# Patient Record
Sex: Male | Born: 1950 | Race: Black or African American | Hispanic: No | Smoking: Current some day smoker
Health system: Southern US, Community
[De-identification: ages and names within clinical notes are randomized; demographics above are authoritative.]

## PROBLEM LIST (undated history)

## (undated) DIAGNOSIS — G629 Polyneuropathy, unspecified: Secondary | ICD-10-CM

## (undated) DIAGNOSIS — F1290 Cannabis use, unspecified, uncomplicated: Secondary | ICD-10-CM

## (undated) DIAGNOSIS — F1910 Other psychoactive substance abuse, uncomplicated: Secondary | ICD-10-CM

## (undated) DIAGNOSIS — M797 Fibromyalgia: Secondary | ICD-10-CM

## (undated) DIAGNOSIS — R001 Bradycardia, unspecified: Secondary | ICD-10-CM

## (undated) DIAGNOSIS — Z720 Tobacco use: Secondary | ICD-10-CM

## (undated) DIAGNOSIS — G4733 Obstructive sleep apnea (adult) (pediatric): Secondary | ICD-10-CM

## (undated) DIAGNOSIS — M5137 Other intervertebral disc degeneration, lumbosacral region: Secondary | ICD-10-CM

## (undated) DIAGNOSIS — J45909 Unspecified asthma, uncomplicated: Secondary | ICD-10-CM

## (undated) DIAGNOSIS — Z8739 Personal history of other diseases of the musculoskeletal system and connective tissue: Secondary | ICD-10-CM

## (undated) DIAGNOSIS — M1990 Unspecified osteoarthritis, unspecified site: Secondary | ICD-10-CM

## (undated) DIAGNOSIS — E785 Hyperlipidemia, unspecified: Secondary | ICD-10-CM

## (undated) DIAGNOSIS — R0600 Dyspnea, unspecified: Secondary | ICD-10-CM

## (undated) DIAGNOSIS — I10 Essential (primary) hypertension: Secondary | ICD-10-CM

## (undated) DIAGNOSIS — E559 Vitamin D deficiency, unspecified: Secondary | ICD-10-CM

## (undated) DIAGNOSIS — M51379 Other intervertebral disc degeneration, lumbosacral region without mention of lumbar back pain or lower extremity pain: Secondary | ICD-10-CM

## (undated) DIAGNOSIS — M549 Dorsalgia, unspecified: Secondary | ICD-10-CM

## (undated) HISTORY — PX: ABDOMINAL EXPLORATION SURGERY: SHX538

## (undated) HISTORY — DX: Obstructive sleep apnea (adult) (pediatric): G47.33

## (undated) HISTORY — DX: Essential (primary) hypertension: I10

## (undated) HISTORY — DX: Hyperlipidemia, unspecified: E78.5

## (undated) HISTORY — PX: TOTAL KNEE ARTHROPLASTY: SHX125

## (undated) HISTORY — PX: BACK SURGERY: SHX140

## (undated) HISTORY — PX: LEG SURGERY: SHX1003

## (undated) HISTORY — PX: JOINT REPLACEMENT: SHX530

---

## 2016-03-09 ENCOUNTER — Emergency Department
Admission: EM | Admit: 2016-03-09 | Discharge: 2016-03-09 | Disposition: A | Discharge status: Home / Self Care | Payer: Medicare HMO | Attending: Emergency Medicine | Admitting: Emergency Medicine

## 2016-03-09 ENCOUNTER — Encounter: Payer: Self-pay | Admitting: Emergency Medicine

## 2016-03-09 ENCOUNTER — Emergency Department: Payer: Medicare HMO

## 2016-03-09 DIAGNOSIS — M5441 Lumbago with sciatica, right side: Secondary | ICD-10-CM | POA: Insufficient documentation

## 2016-03-09 DIAGNOSIS — F17200 Nicotine dependence, unspecified, uncomplicated: Secondary | ICD-10-CM | POA: Insufficient documentation

## 2016-03-09 DIAGNOSIS — M545 Low back pain: Secondary | ICD-10-CM | POA: Diagnosis present

## 2016-03-09 DIAGNOSIS — G8929 Other chronic pain: Secondary | ICD-10-CM | POA: Insufficient documentation

## 2016-03-09 HISTORY — DX: Dorsalgia, unspecified: M54.9

## 2016-03-09 MED ORDER — HYDROCODONE-ACETAMINOPHEN 5-325 MG PO TABS: 1.0000 | ORAL_TABLET | ORAL | 0 refills | Status: DC | PRN

## 2016-03-09 MED ORDER — PREDNISONE 10 MG PO TABS: ORAL_TABLET | ORAL | 0 refills | Status: DC

## 2016-03-09 NOTE — ED Provider Notes (Signed)
[facility] Emergency Department Provider Note  ____________________________________________   First MD Initiated Contact with Patient 03/09/16 1312     (approximate)  I have reviewed the triage vital signs and the nursing notes.   HISTORY  Chief Complaint Leg Pain   HPI Jack Blanchard is a 65 y.o. male is her complaint of pain radiating into his right leg over to his hip and down to his toes. Patient states he had back surgery approximately one year ago and has noticed gradual symptoms for the last 7 months. Patient states that at times it is difficult for him to get up and down and that low back pain has continued. He denies any incontinence of bowel or bladder. He states stiffness and difficulty with movement is worse first thing in the mornings. Patient is not on any medication as he does not have a primary care doctor and has not seen his orthopedic surgeon as this doctor has either moved or retired. Currently he rates his pain as 6/10.   Past Medical History:  Diagnosis Date  . Back pain     There are no active problems to display for this patient.   Past Surgical History:  Procedure Laterality Date  . LEG SURGERY      Prior to Admission medications   Medication Sig Start Date End Date Taking? Authorizing Provider  HYDROcodone-acetaminophen (NORCO/VICODIN) 5-325 MG tablet Take 1 tablet by mouth every 4 (four) hours as needed for moderate pain. 03/09/16   Johnn Hai, PA-C  predniSONE (DELTASONE) 10 MG tablet Take 6 tablets  today, on day 2 take 5 tablets, day 3 take 4 tablets, day 4 take 3 tablets, day 5 take  2 tablets and 1 tablet the last day 03/09/16   Johnn Hai, PA-C    Allergies Review of patient's allergies indicates no known allergies.  No family history on file.  Social History Social History  Substance Use Topics  . Smoking status: Current Some Day Smoker  . Smokeless tobacco: Never Used  . Alcohol use Yes     Review of Systems Constitutional: No fever/chills Cardiovascular: Denies chest pain. Respiratory: Denies shortness of breath. Gastrointestinal: No abdominal pain.  No nausea, no vomiting.  No diarrhea.  No constipation. Genitourinary: Negative for dysuria. Musculoskeletal: Positive for chronic back pain. Positive for paresthesias right leg. Skin: Negative for rash. Neurological: Negative for headaches, focal weakness or numbness.  10-point ROS otherwise negative.  ____________________________________________   PHYSICAL EXAM:  VITAL SIGNS: ED Triage Vitals  Enc Vitals Group     BP 03/09/16 1239 (!) 143/77     Pulse Rate 03/09/16 1239 71     Resp 03/09/16 1239 18     Temp 03/09/16 1239 98 F (36.7 C)     Temp Source 03/09/16 1239 Oral     SpO2 03/09/16 1239 100 %     Weight 03/09/16 1239 240 lb (108.9 kg)     Height 03/09/16 1239 5\' 11"  (1.803 m)     Head Circumference --      Peak Flow --      Pain Score 03/09/16 1240 6     Pain Loc --      Pain Edu? --      Excl. in [place]? --     Constitutional: Alert and oriented. Well appearing and in no acute distress. Eyes: Conjunctivae are normal. PERRL. EOMI. Head: Atraumatic. Nose: No congestion/rhinnorhea. Mouth/Throat: Mucous membranes are moist.  Oropharynx non-erythematous. Neck: No stridor.  Cardiovascular: Normal rate, regular rhythm. Grossly normal heart sounds.  Good peripheral circulation. Respiratory: Normal respiratory effort.  No retractions. Lungs CTAB. Gastrointestinal: Soft and nontender. No distention.  No CVA tenderness. Musculoskeletal: Examination of the back there is no gross deformity but a well-healed surgical scar present. There is tenderness on palpation of L5-S1 area and paravertebral muscles mostly to the right. SI joint is moderately tender. Straight leg raises on the right increases patient's pain at approximately 70. Neurologic:  Normal speech and language. No gross focal neurologic deficits are  appreciated. No gait instability. Reflexes are 2+ bilaterally. Skin:  Skin is warm, dry and intact. No rash noted. Psychiatric: Mood and affect are normal. Speech and behavior are normal.  ____________________________________________   LABS (all labs ordered are listed, but only abnormal results are displayed)  Labs Reviewed - No data to display  RADIOLOGY Lumbar spine x-ray per radiologist: IMPRESSION:  1. No acute finding.  2. Diffuse lumbar disc and facet degeneration as described.  3. L5-S1 solid bony fusion.       ____________________________________________   PROCEDURES  Procedure(s) performed: None  Procedures  Critical Care performed: No  ____________________________________________   INITIAL IMPRESSION / ASSESSMENT AND PLAN / ED COURSE  Pertinent labs & imaging results that were available during my care of the patient were reviewed by me and considered in my medical decision making (see chart for details).    Clinical Course  Patient was encouraged to obtain a primary care doctor for further evaluation and care management. He was also given the name of Dr.Poggi who is on call for orthopedics today should he continued to have problems with his back. Patient was given a prescription for prednisone and is 60 mg with a 6 day taper and Norco as needed for pain. He'll return to the emergency room if any severe worsening of his symptoms or incontinence of bowel or bladder.   ____________________________________________   FINAL CLINICAL IMPRESSION(S) / ED DIAGNOSES  Final diagnoses:  Acute right-sided low back pain with right-sided sciatica      NEW MEDICATIONS STARTED DURING THIS VISIT:  Discharge Medication List as of 03/09/2016  2:45 PM    START taking these medications   Details  HYDROcodone-acetaminophen (NORCO/VICODIN) 5-325 MG tablet Take 1 tablet by mouth every 4 (four) hours as needed for moderate pain., Starting Mon 03/09/2016, Print      predniSONE (DELTASONE) 10 MG tablet Take 6 tablets  today, on day 2 take 5 tablets, day 3 take 4 tablets, day 4 take 3 tablets, day 5 take  2 tablets and 1 tablet the last day, Print         Note:  This document was prepared using Dragon voice recognition software and may include unintentional dictation errors.    Johnn Hai, PA-C 03/09/16 1800    Drenda Freeze, MD 03/10/16 [phone]

## 2016-03-09 NOTE — ED Triage Notes (Signed)
Pt presents to ED with reports of right leg pain that starts at his hip and travels to his toes. Pt reports he had surgery on this leg over a year ago and has had pain ever since. Pt reports no new problem but states he is tired of hurting. Pt also reports low back pain.

## 2016-03-09 NOTE — Discharge Instructions (Signed)
Begin taking prednisone as directed. Norco as needed for pain.  Be aware that you  should not be driving or operating any machinery while taking this medication. Follow-up with [facility] acute care or Dr. Roland Rack if any continued problems with your  back.

## 2016-03-13 ENCOUNTER — Ambulatory Visit: Payer: Self-pay | Admitting: Family Medicine

## 2016-04-24 DIAGNOSIS — G8929 Other chronic pain: Secondary | ICD-10-CM | POA: Insufficient documentation

## 2016-04-24 DIAGNOSIS — M5441 Lumbago with sciatica, right side: Secondary | ICD-10-CM

## 2017-04-30 ENCOUNTER — Encounter: Payer: Self-pay | Admitting: Emergency Medicine

## 2017-04-30 ENCOUNTER — Emergency Department
Admission: EM | Admit: 2017-04-30 | Discharge: 2017-04-30 | Disposition: A | Discharge status: Home / Self Care | Payer: Medicare HMO | Attending: Student in an Organized Health Care Education/Training Program | Admitting: Student in an Organized Health Care Education/Training Program

## 2017-04-30 ENCOUNTER — Other Ambulatory Visit: Payer: Self-pay

## 2017-04-30 DIAGNOSIS — M5410 Radiculopathy, site unspecified: Secondary | ICD-10-CM | POA: Diagnosis not present

## 2017-04-30 DIAGNOSIS — M549 Dorsalgia, unspecified: Secondary | ICD-10-CM | POA: Diagnosis present

## 2017-04-30 DIAGNOSIS — Z79899 Other long term (current) drug therapy: Secondary | ICD-10-CM | POA: Insufficient documentation

## 2017-04-30 DIAGNOSIS — F17200 Nicotine dependence, unspecified, uncomplicated: Secondary | ICD-10-CM | POA: Diagnosis not present

## 2017-04-30 HISTORY — DX: Personal history of other diseases of the musculoskeletal system and connective tissue: Z87.39

## 2017-04-30 MED ORDER — CYCLOBENZAPRINE HCL 10 MG PO TABS: 10.0000 mg | ORAL_TABLET | Freq: Three times a day (TID) | ORAL | 0 refills | Status: DC | PRN

## 2017-04-30 MED ORDER — METHYLPREDNISOLONE 4 MG PO TBPK: ORAL_TABLET | ORAL | 0 refills | Status: DC

## 2017-04-30 MED ORDER — KETOROLAC TROMETHAMINE 60 MG/2ML IM SOLN
60.0000 mg | Freq: Once | INTRAMUSCULAR | Status: AC
  Administered 2017-04-30: 60 mg via INTRAMUSCULAR
  Filled 2017-04-30: qty 2

## 2017-04-30 MED ORDER — TRAMADOL HCL 50 MG PO TABS: 50.0000 mg | ORAL_TABLET | Freq: Four times a day (QID) | ORAL | 0 refills | Status: AC | PRN

## 2017-04-30 NOTE — ED Notes (Signed)
Pt states he is having back pain. Pt has hx of back sx with screws and rods. Pt is stating he believes it is his sciatica nerve because pain goes both legs. Pt is NAD, ambulatory with cane and awaiting EDP.

## 2017-04-30 NOTE — ED Provider Notes (Signed)
[facility] Emergency Department Provider Note   ____________________________________________   First MD Initiated Contact with Patient 04/30/17 1041     (approximate)  I have reviewed the triage vital signs and the nursing notes.   HISTORY  Chief Complaint Back Pain    HPI Jack Blanchard is a 66 y.o. male patient complaining of radicular back pain bilateral lower extremities. Patient state onset 2-3 days. Patient denies bladder or bowel dysfunction.Patient rates the pain as 8/10. Patient described a pain as "achy". No palliative measures for complaint. 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 Past Medical History:  Diagnosis Date  . Back pain   . History of degenerative disc disease     There are no active problems to display for this patient.   Past Surgical History:  Procedure Laterality Date  . BACK SURGERY     lumbar surgery with rods placed  . LEG SURGERY      Prior to Admission medications   Medication Sig Start Date End Date Taking? Authorizing Provider  cyclobenzaprine (FLEXERIL) 10 MG tablet Take 1 tablet (10 mg total) by mouth 3 (three) times daily as needed. 04/30/17   Sable Feil, PA-C  HYDROcodone-acetaminophen (NORCO/VICODIN) 5-325 MG tablet Take 1 tablet by mouth every 4 (four) hours as needed for moderate pain. 03/09/16   Johnn Hai, PA-C  methylPREDNISolone (MEDROL DOSEPAK) 4 MG TBPK tablet Take Tapered dose as directed 04/30/17   Sable Feil, PA-C  predniSONE (DELTASONE) 10 MG tablet Take 6 tablets  today, on day 2 take 5 tablets, day 3 take 4 tablets, day 4 take 3 tablets, day 5 take  2 tablets and 1 tablet the last day 03/09/16   Johnn Hai, PA-C  traMADol (ULTRAM) 50 MG tablet Take 1 tablet (50 mg total) by mouth every 6 (six) hours as needed. 04/30/17 04/30/18  Sable Feil, PA-C    Allergies Patient has no known allergies.  No family history on  file.  Social History Social History   Tobacco Use  . Smoking status: Current Some Day Smoker  . Smokeless tobacco: Never Used  Substance Use Topics  . Alcohol use: Yes  . Drug use: Not on file    Review of Systems Constitutional: No fever/chills Eyes: No visual changes. ENT: No sore throat. Cardiovascular: Denies chest pain. Respiratory: Denies shortness of breath. Gastrointestinal: No abdominal pain.  No nausea, no vomiting.  No diarrhea.  No constipation. Genitourinary: Negative for dysuria. Musculoskeletal: Positive for back pain. Skin: Negative for rash. Neurological: Negative for headaches, focal weakness or numbness.   ____________________________________________   PHYSICAL EXAM:  VITAL SIGNS: ED Triage Vitals  Enc Vitals Group     BP 04/30/17 0938 (!) 159/80     Pulse Rate 04/30/17 0938 70     Resp 04/30/17 0938 16     Temp 04/30/17 0938 97.9 F (36.6 C)     Temp Source 04/30/17 0938 Oral     SpO2 04/30/17 0938 96 %     Weight 04/30/17 0937 250 lb (113.4 kg)     Height 04/30/17 0937 5\' 11"  (1.803 m)     Head Circumference --      Peak Flow --      Pain Score 04/30/17 0937 8     Pain Loc --      Pain Edu? --      Excl. in [place]? --     Constitutional: Alert and oriented. Well  appearing and in no acute distress. Hematological/Lymphatic/Immunilogical: No cervical lymphadenopathy. Cardiovascular: Normal rate, regular rhythm. Grossly normal heart sounds.  Good peripheral circulation. Respiratory: Normal respiratory effort.  No retractions. Lungs CTAB. Gastrointestinal: Soft and nontender. No distention. No abdominal bruits. No CVA tenderness. Musculoskeletal: No obvious deformity of the surgical scar consistent with history. Patient decreased range of motion with flexion and extension. Patient has negative straight leg test on the left but positive on the right. Neurologic:  Normal speech and language. No gross focal neurologic deficits are appreciated. No gait  instability. Skin:  Skin is warm, dry and intact. No rash noted. Psychiatric: Mood and affect are normal. Speech and behavior are normal.  ____________________________________________   LABS (all labs ordered are listed, but only abnormal results are displayed)  Labs Reviewed - No data to display ____________________________________________  EKG   ____________________________________________  RADIOLOGY  No results found.  ____________________________________________   PROCEDURES  Procedure(s) performed: None  Procedures  Critical Care performed: No  ____________________________________________   INITIAL IMPRESSION / ASSESSMENT AND PLAN / ED COURSE  As part of my medical decision making, I reviewed the following data within the electronic MEDICAL RECORD NUMBER    Radicular back pain. Patient given discharge care instructions. Patient advised to follow her treating doctor for continued care.      ____________________________________________   FINAL CLINICAL IMPRESSION(S) / ED DIAGNOSES  Final diagnoses:  Radicular low back pain     ED Discharge Orders        Ordered    methylPREDNISolone (MEDROL DOSEPAK) 4 MG TBPK tablet     04/30/17 1051    traMADol (ULTRAM) 50 MG tablet  Every 6 hours PRN     04/30/17 1051    cyclobenzaprine (FLEXERIL) 10 MG tablet  3 times daily PRN     04/30/17 1051       Note:  This document was prepared using Dragon voice recognition software and may include unintentional dictation errors.    Sable Feil, PA-C 04/30/17 1056    Merlyn Lot, MD 04/30/17 [phone]

## 2017-04-30 NOTE — ED Triage Notes (Signed)
Arrives with c/o low back pain radiating down legs.  Left > Right.  Onset of symptoms 2-3 days ago.

## 2017-04-30 NOTE — ED Notes (Signed)
Pt verbalized understanding of discharge instructions. NAD at this time. 

## 2017-07-21 ENCOUNTER — Telehealth: Payer: Self-pay | Admitting: Gastroenterology

## 2017-07-21 NOTE — Telephone Encounter (Signed)
PT CALLED TO SCHEDULE HIS REFERRAL APT FOR A COLONBOSCOPY PLEASE RETURN CALL

## 2017-07-22 ENCOUNTER — Telehealth: Payer: Self-pay | Admitting: Gastroenterology

## 2017-07-22 ENCOUNTER — Other Ambulatory Visit: Payer: Self-pay

## 2017-07-22 DIAGNOSIS — Z1211 Encounter for screening for malignant neoplasm of colon: Secondary | ICD-10-CM

## 2017-07-22 NOTE — Telephone Encounter (Signed)
Pt is calling to set up colonoscopy

## 2017-07-22 NOTE — Telephone Encounter (Signed)
Gastroenterology Pre-Procedure Review  Request Date: 08/03/17 Requesting Physician: Dr. Marius Ditch  PATIENT REVIEW QUESTIONS: The patient responded to the following health history questions as indicated:    1. Are you having any GI issues? no 2. Do you have a personal history of Polyps? no 3. Do you have a family history of Colon Cancer or Polyps? no 4. Diabetes Mellitus? no 5. Joint replacements in the past 12 months?no 6. Major health problems in the past 3 months?no 7. Any artificial heart valves, MVP, or defibrillator?no    MEDICATIONS & ALLERGIES:    Patient reports the following regarding taking any anticoagulation/antiplatelet therapy:   Plavix, Coumadin, Eliquis, Xarelto, Lovenox, Pradaxa, Brilinta, or Effient? no Aspirin? no  Patient confirms/reports the following medications:  Current Outpatient Medications  Medication Sig Dispense Refill  . cyclobenzaprine (FLEXERIL) 10 MG tablet Take 1 tablet (10 mg total) by mouth 3 (three) times daily as needed. 15 tablet 0  . HYDROcodone-acetaminophen (NORCO/VICODIN) 5-325 MG tablet Take 1 tablet by mouth every 4 (four) hours as needed for moderate pain. 15 tablet 0  . methylPREDNISolone (MEDROL DOSEPAK) 4 MG TBPK tablet Take Tapered dose as directed 21 tablet 0  . predniSONE (DELTASONE) 10 MG tablet Take 6 tablets  today, on day 2 take 5 tablets, day 3 take 4 tablets, day 4 take 3 tablets, day 5 take  2 tablets and 1 tablet the last day 21 tablet 0  . traMADol (ULTRAM) 50 MG tablet Take 1 tablet (50 mg total) by mouth every 6 (six) hours as needed. 20 tablet 0   No current facility-administered medications for this visit.     Patient confirms/reports the following allergies:  No Known Allergies  No orders of the defined types were placed in this encounter.   AUTHORIZATION INFORMATION Primary Insurance: 1D#: Group #:  Secondary Insurance: 1D#: Group #:  SCHEDULE INFORMATION: Date: 08/03/17 Time: Location:[facility]

## 2017-07-23 SURGERY — COLONOSCOPY WITH PROPOFOL
Anesthesia: General

## 2017-08-03 ENCOUNTER — Ambulatory Visit
Admission: RE | Admit: 2017-08-03 | Discharge: 2017-08-03 | Disposition: A | Discharge status: Home / Self Care | Payer: Medicare HMO | Source: Ambulatory Visit | Attending: Gastroenterology | Admitting: Gastroenterology

## 2017-08-03 ENCOUNTER — Encounter: Payer: Self-pay | Admitting: Anesthesiology

## 2017-08-03 ENCOUNTER — Ambulatory Visit: Payer: Medicare HMO | Admitting: Anesthesiology

## 2017-08-03 ENCOUNTER — Encounter
Admission: RE | Disposition: A | Discharge status: Home / Self Care | Payer: Self-pay | Source: Ambulatory Visit | Attending: Gastroenterology

## 2017-08-03 DIAGNOSIS — Q438 Other specified congenital malformations of intestine: Secondary | ICD-10-CM | POA: Diagnosis not present

## 2017-08-03 DIAGNOSIS — K635 Polyp of colon: Secondary | ICD-10-CM | POA: Diagnosis not present

## 2017-08-03 DIAGNOSIS — J4520 Mild intermittent asthma, uncomplicated: Secondary | ICD-10-CM | POA: Insufficient documentation

## 2017-08-03 DIAGNOSIS — E669 Obesity, unspecified: Secondary | ICD-10-CM | POA: Diagnosis not present

## 2017-08-03 DIAGNOSIS — Z6841 Body Mass Index (BMI) 40.0 and over, adult: Secondary | ICD-10-CM | POA: Diagnosis not present

## 2017-08-03 DIAGNOSIS — D128 Benign neoplasm of rectum: Secondary | ICD-10-CM | POA: Insufficient documentation

## 2017-08-03 DIAGNOSIS — D123 Benign neoplasm of transverse colon: Secondary | ICD-10-CM | POA: Insufficient documentation

## 2017-08-03 DIAGNOSIS — D124 Benign neoplasm of descending colon: Secondary | ICD-10-CM | POA: Diagnosis not present

## 2017-08-03 DIAGNOSIS — K621 Rectal polyp: Secondary | ICD-10-CM | POA: Insufficient documentation

## 2017-08-03 DIAGNOSIS — D127 Benign neoplasm of rectosigmoid junction: Secondary | ICD-10-CM | POA: Insufficient documentation

## 2017-08-03 DIAGNOSIS — F17200 Nicotine dependence, unspecified, uncomplicated: Secondary | ICD-10-CM | POA: Diagnosis not present

## 2017-08-03 DIAGNOSIS — Z1211 Encounter for screening for malignant neoplasm of colon: Secondary | ICD-10-CM

## 2017-08-03 DIAGNOSIS — Z881 Allergy status to other antibiotic agents status: Secondary | ICD-10-CM | POA: Diagnosis not present

## 2017-08-03 DIAGNOSIS — Z79899 Other long term (current) drug therapy: Secondary | ICD-10-CM | POA: Diagnosis not present

## 2017-08-03 HISTORY — PX: COLONOSCOPY WITH PROPOFOL: SHX5780

## 2017-08-03 SURGERY — COLONOSCOPY WITH PROPOFOL
Anesthesia: General

## 2017-08-03 MED ORDER — SODIUM CHLORIDE 0.9 % IV SOLN
INTRAVENOUS | Status: DC
  Administered 2017-08-03: 1000 mL via INTRAVENOUS

## 2017-08-03 MED ORDER — PROPOFOL 500 MG/50ML IV EMUL
INTRAVENOUS | Status: AC
  Filled 2017-08-03: qty 50

## 2017-08-03 MED ORDER — PROPOFOL 10 MG/ML IV BOLUS
INTRAVENOUS | Status: AC
  Filled 2017-08-03: qty 20

## 2017-08-03 MED ORDER — PROPOFOL 500 MG/50ML IV EMUL
INTRAVENOUS | Status: DC | PRN
  Administered 2017-08-03: 150 ug/kg/min via INTRAVENOUS

## 2017-08-03 MED ORDER — PROPOFOL 10 MG/ML IV BOLUS
INTRAVENOUS | Status: DC | PRN
  Administered 2017-08-03: 50 mg via INTRAVENOUS
  Administered 2017-08-03: 100 mg via INTRAVENOUS

## 2017-08-03 NOTE — OR Nursing (Signed)
Pt. Came in c/o sob and 'cold' x1 week. Pt later said he had a the flu. Skin cold and clammy. No temperature. Drank liquids until 1230 pm today. Ate solid foods until 430pm yesterday. Smoked marijuana yesterday. Drs Randa Lynn and vanga aware of these and have decided to proceed.

## 2017-08-03 NOTE — Op Note (Signed)
[facility] Gastroenterology Patient Name: Jack Blanchard Procedure Date: 08/03/2017 2:42 PM MRN: [id number] Account #: [id number] Date of Birth: 1950-06-01 Admit Type: Outpatient Age: 67 Room: [facility] ENDO ROOM 4 Gender: Male Note Status: Finalized Procedure:            Colonoscopy Indications:          Screening for colorectal malignant neoplasm, This is                        the patient's first colonoscopy Providers:            Lin Landsman MD, MD Referring MD:         No Local Md, MD (Referring MD) Medicines:            Monitored Anesthesia Care Complications:        No immediate complications. Estimated blood loss:                        Minimal. Procedure:            Pre-Anesthesia Assessment:                       - Prior to the procedure, a History and Physical was                        performed, and patient medications and allergies were                        reviewed. The patient is competent. The risks and                        benefits of the procedure and the sedation options and                        risks were discussed with the patient. All questions                        were answered and informed consent was obtained.                        Patient identification and proposed procedure were                        verified by the physician, the nurse, the                        anesthesiologist, the anesthetist and the technician in                        the pre-procedure area in the procedure room in the                        endoscopy suite. Mental Status Examination: alert and                        oriented. Airway Examination: normal oropharyngeal                        airway and neck mobility. Respiratory Examination:  clear to auscultation. CV Examination: normal.                        Prophylactic Antibiotics: The patient does not require                        prophylactic antibiotics. Prior Anticoagulants:  The                        patient has taken no previous anticoagulant or                        antiplatelet agents. ASA Grade Assessment: III - A                        patient with severe systemic disease. After reviewing                        the risks and benefits, the patient was deemed in                        satisfactory condition to undergo the procedure. The                        anesthesia plan was to use monitored anesthesia care                        (MAC). Immediately prior to administration of                        medications, the patient was re-assessed for adequacy                        to receive sedatives. The heart rate, respiratory rate,                        oxygen saturations, blood pressure, adequacy of                        pulmonary ventilation, and response to care were                        monitored throughout the procedure. The physical status                        of the patient was re-assessed after the procedure.                       After obtaining informed consent, the colonoscope was                        passed under direct vision. Throughout the procedure,                        the patient's blood pressure, pulse, and oxygen                        saturations were monitored continuously. The                        Colonoscope  was introduced through the anus and                        advanced to the the cecum, identified by appendiceal                        orifice and ileocecal valve. The colonoscopy was                        unusually difficult due to significant looping and the                        patient's body habitus. Successful completion of the                        procedure was aided by applying abdominal pressure. The                        patient tolerated the procedure well. The quality of                        the bowel preparation was evaluated using the BBPS                        [facility] Bowel Preparation Scale)  with scores of: Right                        Colon = 3, Transverse Colon = 3 and Left Colon = 3                        (entire mucosa seen well with no residual staining,                        small fragments of stool or opaque liquid). The total                        BBPS score equals 9. Findings:      The perianal and digital rectal examinations were normal. Pertinent       negatives include normal sphincter tone and no palpable rectal lesions.      Eight sessile polyps were found in the rectum, recto-sigmoid colon,       descending colon and transverse colon. The polyps were 4 to 6 mm in       size. These polyps were removed with a cold snare. Resection and       retrieval were complete.      The retroflexed view of the distal rectum and anal verge was normal and       showed no anal or rectal abnormalities. Impression:           - Eight 4 to 6 mm polyps in the rectum, at the                        recto-sigmoid colon, in the descending colon and in the                        transverse colon, removed with a cold snare. Resected  and retrieved.                       - The distal rectum and anal verge are normal on                        retroflexion view. Recommendation:       - Discharge patient to home.                       - Resume previous diet today.                       - Continue present medications.                       - Await pathology results.                       - Repeat colonoscopy in 3 years for surveillance of                        multiple polyps. Procedure Code(s):    --- Professional ---                       [phone], Colonoscopy, flexible; with removal of tumor(s),                        polyp(s), or other lesion(s) by snare technique Diagnosis Code(s):    --- Professional ---                       Z12.11, Encounter for screening for malignant neoplasm                        of colon                       K62.1, Rectal polyp                        D12.7, Benign neoplasm of rectosigmoid junction                       D12.4, Benign neoplasm of descending colon                       D12.3, Benign neoplasm of transverse colon (hepatic                        flexure or splenic flexure) CPT copyright 2016 American Medical Association. All rights reserved. The codes documented in this report are preliminary and upon coder review may  be revised to meet current compliance requirements. Dr. Ulyess Mort Lin Landsman MD, MD 08/03/2017 3:31:07 PM This report has been signed electronically. Number of Addenda: 0 Note Initiated On: 08/03/2017 2:42 PM Scope Withdrawal Time: 0 hours 34 minutes 8 seconds  Total Procedure Duration: 0 hours 38 minutes 30 seconds       [facility]

## 2017-08-03 NOTE — Anesthesia Post-op Follow-up Note (Signed)
Anesthesia QCDR form completed.        

## 2017-08-03 NOTE — H&P (Signed)
Jack Darby, MD [address]  [place]  [place], [place] [address]  Main: [phone]  Fax: [phone] Pager: [phone]  Primary Care Physician:  [facility] Primary Gastroenterologist:  Dr. Cephas Blanchard  Pre-Procedure History & Physical: HPI:  Jack Blanchard is a 67 y.o. male is here for an colonoscopy.   Past Medical History:  Diagnosis Date  . Back pain   . History of degenerative disc disease     Past Surgical History:  Procedure Laterality Date  . BACK SURGERY     lumbar surgery with rods placed  . LEG SURGERY      Prior to Admission medications   Medication Sig Start Date End Date Taking? Authorizing Provider  cyclobenzaprine (FLEXERIL) 10 MG tablet Take 1 tablet (10 mg total) by mouth 3 (three) times daily as needed. 04/30/17  Yes Sable Feil, PA-C  HYDROcodone-acetaminophen (NORCO/VICODIN) 5-325 MG tablet Take 1 tablet by mouth every 4 (four) hours as needed for moderate pain. 03/09/16  Yes Johnn Hai, PA-C  traMADol (ULTRAM) 50 MG tablet Take 1 tablet (50 mg total) by mouth every 6 (six) hours as needed. 04/30/17 04/30/18 Yes Sable Feil, PA-C  methylPREDNISolone (MEDROL DOSEPAK) 4 MG TBPK tablet Take Tapered dose as directed Patient not taking: Reported on 08/03/2017 04/30/17   Sable Feil, PA-C  predniSONE (DELTASONE) 10 MG tablet Take 6 tablets  today, on day 2 take 5 tablets, day 3 take 4 tablets, day 4 take 3 tablets, day 5 take  2 tablets and 1 tablet the last day Patient not taking: Reported on 08/03/2017 03/09/16   Johnn Hai, PA-C    Allergies as of 07/26/2017  . (No Known Allergies)    History reviewed. No pertinent family history.  Social History   Socioeconomic History  . Marital status: Divorced    Spouse name: Not on file  . Number of children: Not on file  . Years of education: Not on file  . Highest education level: Not on file  Social Needs  . Financial resource strain: Not on  file  . Food insecurity - worry: Not on file  . Food insecurity - inability: Not on file  . Transportation needs - medical: Not on file  . Transportation needs - non-medical: Not on file  Occupational History  . Not on file  Tobacco Use  . Smoking status: Current Some Day Smoker  . Smokeless tobacco: Never Used  Substance and Sexual Activity  . Alcohol use: Yes    Comment: 1/5 a week  . Drug use: Yes    Types: Marijuana  . Sexual activity: Not on file  Other Topics Concern  . Not on file  Social History Narrative  . Not on file    Review of Systems: See HPI, otherwise negative ROS  Physical Exam: BP 118/75   Pulse 69   Temp (!) 97.4 F (36.3 C) (Tympanic)   Ht 5\' 11"  (1.803 m)   Wt 291 lb (132 kg)   SpO2 100%   BMI 40.59 kg/m  General:   Alert,  pleasant and cooperative in NAD Head:  Normocephalic and atraumatic. Neck:  Supple; no masses or thyromegaly. Lungs:  Clear throughout to auscultation.    Heart:  Regular rate and rhythm. Abdomen:  Soft, nontender and nondistended. Normal bowel sounds, without guarding, and without rebound.   Neurologic:  Alert and  oriented x4;  grossly normal neurologically.  Impression/Plan: Jack Blanchard is here for an  colonoscopy to be performed for colon cancer screening  Risks, benefits, limitations, and alternatives regarding  colonoscopy have been reviewed with the patient.  Questions have been answered.  All parties agreeable.   Sherri Sear, MD  08/03/2017, 2:31 PM

## 2017-08-03 NOTE — Anesthesia Preprocedure Evaluation (Addendum)
Anesthesia Evaluation  Patient identified by MRN, date of birth, ID band Patient awake    Reviewed: Allergy & Precautions, NPO status , Patient's Chart, lab work & pertinent test results, reviewed documented beta blocker date and time   History of Anesthesia Complications Negative for: history of anesthetic complications  Airway Mallampati: III  TM Distance: >3 FB Neck ROM: Full    Dental no notable dental hx.    Pulmonary asthma (mild intermittent) , neg sleep apnea, Current Smoker,  Had fever and cough about a week ago, cough has been improving, afebrile today   breath sounds clear to auscultation- rhonchi (-) wheezing      Cardiovascular Exercise Tolerance: Good (-) hypertension(-) CAD, (-) Past MI, (-) Cardiac Stents and (-) CABG  Rhythm:Regular Rate:Normal - Systolic murmurs and - Diastolic murmurs    Neuro/Psych negative neurological ROS  negative psych ROS   GI/Hepatic negative GI ROS, Neg liver ROS,   Endo/Other  negative endocrine ROSneg diabetes  Renal/GU negative Renal ROS     Musculoskeletal negative musculoskeletal ROS (+)   Abdominal (+) + obese,   Peds  Hematology negative hematology ROS (+)   Anesthesia Other Findings Past Medical History: No date: Back pain No date: History of degenerative disc disease   Reproductive/Obstetrics                            Anesthesia Physical Anesthesia Plan  ASA: III  Anesthesia Plan: General   Post-op Pain Management:    Induction: Intravenous  PONV Risk Score and Plan: 0 and Propofol infusion  Airway Management Planned: Natural Airway  Additional Equipment:   Intra-op Plan:   Post-operative Plan:   Informed Consent: I have reviewed the patients History and Physical, chart, labs and discussed the procedure including the risks, benefits and alternatives for the proposed anesthesia with the patient or authorized  representative who has indicated his/her understanding and acceptance.     Plan Discussed with: CRNA and Anesthesiologist  Anesthesia Plan Comments:        Anesthesia Quick Evaluation

## 2017-08-03 NOTE — Transfer of Care (Signed)
Immediate Anesthesia Transfer of Care Note  Patient: Jack Blanchard  Procedure(s) Performed: COLONOSCOPY WITH PROPOFOL (N/A )  Patient Location: PACU  Anesthesia Type:General  Level of Consciousness: sedated  Airway & Oxygen Therapy: Patient Spontanous Breathing and Patient connected to nasal cannula oxygen  Post-op Assessment: Report given to RN and Post -op Vital signs reviewed and stable  Post vital signs: Reviewed and stable  Last Vitals:  Vitals:   08/03/17 1312 08/03/17 1534  BP: 118/75 101/90  Pulse: 69 81  Resp:  14  Temp: (!) 36.3 C 36.4 C  SpO2: 100% 100%    Last Pain:  Vitals:   08/03/17 1312  TempSrc: Tympanic      Patients Stated Pain Goal: 0 (17/61/60 7371)  Complications: No apparent anesthesia complications

## 2017-08-04 ENCOUNTER — Encounter: Payer: Self-pay | Admitting: Gastroenterology

## 2017-08-04 NOTE — Anesthesia Postprocedure Evaluation (Signed)
Anesthesia Post Note  Patient: Jack Blanchard  Procedure(s) Performed: COLONOSCOPY WITH PROPOFOL (N/A )  Patient location during evaluation: Endoscopy Anesthesia Type: General Level of consciousness: awake and alert and oriented Pain management: pain level controlled Vital Signs Assessment: post-procedure vital signs reviewed and stable Respiratory status: spontaneous breathing, nonlabored ventilation and respiratory function stable Cardiovascular status: blood pressure returned to baseline and stable Postop Assessment: no signs of nausea or vomiting Anesthetic complications: no     Last Vitals:  Vitals:   08/03/17 1552 08/03/17 1554  BP:  135/78  Pulse: (!) 57 63  Resp: 17 19  Temp:    SpO2: 100% 100%    Last Pain:  Vitals:   08/03/17 1312  TempSrc: Tympanic                 [name]

## 2017-08-06 LAB — SURGICAL PATHOLOGY

## 2017-08-09 ENCOUNTER — Encounter: Payer: Self-pay | Admitting: Gastroenterology

## 2017-08-10 ENCOUNTER — Other Ambulatory Visit: Payer: Self-pay | Admitting: Family Medicine

## 2017-08-10 ENCOUNTER — Ambulatory Visit
Admission: RE | Admit: 2017-08-10 | Discharge: 2017-08-10 | Disposition: A | Discharge status: Home / Self Care | Payer: Medicare HMO | Source: Ambulatory Visit | Attending: Family Medicine | Admitting: Family Medicine

## 2017-08-10 DIAGNOSIS — M25562 Pain in left knee: Secondary | ICD-10-CM | POA: Diagnosis present

## 2017-08-10 DIAGNOSIS — M25561 Pain in right knee: Principal | ICD-10-CM

## 2017-08-10 DIAGNOSIS — M170 Bilateral primary osteoarthritis of knee: Secondary | ICD-10-CM | POA: Insufficient documentation

## 2017-08-10 DIAGNOSIS — G8929 Other chronic pain: Secondary | ICD-10-CM

## 2017-08-10 DIAGNOSIS — M2342 Loose body in knee, left knee: Secondary | ICD-10-CM | POA: Diagnosis not present

## 2017-09-03 ENCOUNTER — Encounter: Payer: Self-pay | Admitting: Emergency Medicine

## 2017-09-03 ENCOUNTER — Emergency Department: Payer: Medicare HMO

## 2017-09-03 ENCOUNTER — Emergency Department
Admission: EM | Admit: 2017-09-03 | Discharge: 2017-09-03 | Disposition: A | Discharge status: Home / Self Care | Payer: Medicare HMO | Attending: Emergency Medicine | Admitting: Emergency Medicine

## 2017-09-03 ENCOUNTER — Other Ambulatory Visit: Payer: Self-pay

## 2017-09-03 DIAGNOSIS — M1612 Unilateral primary osteoarthritis, left hip: Secondary | ICD-10-CM | POA: Diagnosis not present

## 2017-09-03 DIAGNOSIS — F17210 Nicotine dependence, cigarettes, uncomplicated: Secondary | ICD-10-CM | POA: Diagnosis not present

## 2017-09-03 DIAGNOSIS — M5432 Sciatica, left side: Secondary | ICD-10-CM | POA: Insufficient documentation

## 2017-09-03 DIAGNOSIS — M25552 Pain in left hip: Secondary | ICD-10-CM | POA: Diagnosis present

## 2017-09-03 LAB — CBC WITH DIFFERENTIAL/PLATELET
Basophils Absolute: 0 10*3/uL (ref 0–0.1)
Basophils Relative: 1 %
Eosinophils Absolute: 0.2 10*3/uL (ref 0–0.7)
Eosinophils Relative: 4 %
HEMATOCRIT: 42.4 % (ref 40.0–52.0)
Hemoglobin: 14.5 g/dL (ref 13.0–18.0)
LYMPHS ABS: 2.9 10*3/uL (ref 1.0–3.6)
Lymphocytes Relative: 47 %
MCH: 33.1 pg (ref 26.0–34.0)
MCHC: 34.1 g/dL (ref 32.0–36.0)
MCV: 97 fL (ref 80.0–100.0)
MONO ABS: 0.7 10*3/uL (ref 0.2–1.0)
MONOS PCT: 11 %
NEUTROS ABS: 2.2 10*3/uL (ref 1.4–6.5)
Neutrophils Relative %: 37 %
Platelets: 192 10*3/uL (ref 150–440)
RBC: 4.37 MIL/uL — ABNORMAL LOW (ref 4.40–5.90)
RDW: 15.7 % — AB (ref 11.5–14.5)
WBC: 6.1 10*3/uL (ref 3.8–10.6)

## 2017-09-03 LAB — BASIC METABOLIC PANEL
ANION GAP: 5 (ref 5–15)
BUN: 18 mg/dL (ref 6–20)
CO2: 24 mmol/L (ref 22–32)
Calcium: 8.8 mg/dL — ABNORMAL LOW (ref 8.9–10.3)
Chloride: 110 mmol/L (ref 101–111)
Creatinine, Ser: 0.76 mg/dL (ref 0.61–1.24)
GFR calc Af Amer: >60 mL/min (ref >60)
GFR calc non Af Amer: >60 mL/min (ref >60)
GLUCOSE: 88 mg/dL (ref 65–99)
POTASSIUM: 3.6 mmol/L (ref 3.5–5.1)
Sodium: 139 mmol/L (ref 135–145)

## 2017-09-03 LAB — SEDIMENTATION RATE: Sed Rate: 5 mm/hr (ref 0–20)

## 2017-09-03 LAB — URIC ACID: Uric Acid, Serum: 6.2 mg/dL (ref 4.4–7.6)

## 2017-09-03 MED ORDER — KETOROLAC TROMETHAMINE 30 MG/ML IJ SOLN
INTRAMUSCULAR | Status: AC
  Filled 2017-09-03: qty 1

## 2017-09-03 MED ORDER — TRAMADOL HCL 50 MG PO TABS: 50.0000 mg | ORAL_TABLET | Freq: Four times a day (QID) | ORAL | 0 refills | Status: DC | PRN

## 2017-09-03 MED ORDER — KETOROLAC TROMETHAMINE 30 MG/ML IJ SOLN
30.0000 mg | Freq: Once | INTRAMUSCULAR | Status: AC
  Administered 2017-09-03: 30 mg via INTRAMUSCULAR

## 2017-09-03 MED ORDER — METHYLPREDNISOLONE 4 MG PO TBPK: ORAL_TABLET | ORAL | 0 refills | Status: DC

## 2017-09-03 NOTE — ED Provider Notes (Signed)
[facility] Emergency Department Provider Note   ____________________________________________   First MD Initiated Contact with Patient 09/03/17 1204     (approximate)  I have reviewed the triage vital signs and the nursing notes.   HISTORY  Chief Complaint Leg Pain    HPI Jack Blanchard is a 67 y.o. male patient presents with left hip and pain radiating down to his foot.  Patient also complained of ankle and foot edema.  Patient denies provocative incident for complaint.  Patient was seen this facility couple weeks ago diagnosed with arthritis of the left knee.  Patient has a history of degenerative disc disease and lumbar spine with internal fixations.  Past Medical History:  Diagnosis Date  . Back pain   . History of degenerative disc disease     Patient Active Problem List   Diagnosis Date Noted  . Special screening for malignant neoplasms, colon     Past Surgical History:  Procedure Laterality Date  . BACK SURGERY     lumbar surgery with rods placed  . COLONOSCOPY WITH PROPOFOL N/A 08/03/2017   Procedure: COLONOSCOPY WITH PROPOFOL;  Surgeon: Lin Landsman, MD;  Location: [facility] ENDOSCOPY;  Service: Gastroenterology;  Laterality: N/A;  . LEG SURGERY      Prior to Admission medications   Medication Sig Start Date End Date Taking? Authorizing Provider  cyclobenzaprine (FLEXERIL) 10 MG tablet Take 1 tablet (10 mg total) by mouth 3 (three) times daily as needed. 04/30/17   Sable Feil, PA-C  HYDROcodone-acetaminophen (NORCO/VICODIN) 5-325 MG tablet Take 1 tablet by mouth every 4 (four) hours as needed for moderate pain. 03/09/16   Johnn Hai, PA-C  methylPREDNISolone (MEDROL DOSEPAK) 4 MG TBPK tablet Take Tapered dose as directed 09/03/17   Sable Feil, PA-C  traMADol (ULTRAM) 50 MG tablet Take 1 tablet (50 mg total) by mouth every 6 (six) hours as needed. 04/30/17 04/30/18  Sable Feil, PA-C  traMADol (ULTRAM) 50 MG tablet  Take 1 tablet (50 mg total) by mouth every 6 (six) hours as needed. 09/03/17 09/03/18  Sable Feil, PA-C    Allergies Vancomycin  No family history on file.  Social History Social History   Tobacco Use  . Smoking status: Current Some Day Smoker  . Smokeless tobacco: Never Used  Substance Use Topics  . Alcohol use: Yes    Comment: 1/5 a week  . Drug use: Yes    Types: Marijuana    Review of Systems Constitutional: No fever/chills Eyes: No visual changes. ENT: No sore throat. Cardiovascular: Denies chest pain. Respiratory: Denies shortness of breath. Gastrointestinal: No abdominal pain.  No nausea, no vomiting.  No diarrhea.  No constipation. Genitourinary: Negative for dysuria. Musculoskeletal: Left hip and left lower extremity pain. Skin: Negative for rash. Neurological: Negative for headaches, focal weakness or numbness. Allergic/Immunilogical: Vancomycin. ____________________________________________   PHYSICAL EXAM:  VITAL SIGNS: ED Triage Vitals [09/03/17 1133]  Enc Vitals Group     BP 137/77     Pulse Rate (!) 59     Resp 16     Temp 98.1 F (36.7 C)     Temp Source Oral     SpO2 98 %     Weight 280 lb (127 kg)     Height 5\' 11"  (1.803 m)     Head Circumference      Peak Flow      Pain Score 0     Pain Loc  Pain Edu?      Excl. in [place]?    Constitutional: Alert and oriented. Well appearing and in no acute distress. Cardiovascular: Normal rate, regular rhythm. Grossly normal heart sounds.  Good peripheral circulation. Respiratory: Normal respiratory effort.  No retractions. Lungs CTAB. Musculoskeletal: No obvious hip deformity.  No leg length discrepancy.  Patient is moderate guarding palpation at the greater trochanter area.  No guarding with external or internal rotation of the hip.  Hip flexion within normal limits.  Skin:  Skin is warm, dry and intact. No rash noted. Psychiatric: Mood and affect are normal. Speech and behavior are  normal.  ____________________________________________   LABS (all labs ordered are listed, but only abnormal results are displayed)  Labs Reviewed  BASIC METABOLIC PANEL - Abnormal; Notable for the following components:      Result Value   Calcium 8.8 (*)    All other components within normal limits  CBC WITH DIFFERENTIAL/PLATELET - Abnormal; Notable for the following components:   RBC 4.37 (*)    RDW 15.7 (*)    All other components within normal limits  SEDIMENTATION RATE  URIC ACID   ____________________________________________  EKG   ____________________________________________  RADIOLOGY  Official radiology report(s): No results found.  ____________________________________________   PROCEDURES  Procedure(s) performed: None  Procedures  Critical Care performed: No  ____________________________________________   INITIAL IMPRESSION / ASSESSMENT AND PLAN / ED COURSE  As part of my medical decision making, I reviewed the following data within the electronic MEDICAL RECORD NUMBER    Left hip pain secondary to arthritis.  Discussed x-ray findings with patient.  Patient given discharge instruction plastic medication as directed.  Patient advised follow-up with PCP.      ____________________________________________   FINAL CLINICAL IMPRESSION(S) / ED DIAGNOSES  Final diagnoses:  Arthritis of left hip  Sciatica of left side     ED Discharge Orders        Ordered    traMADol (ULTRAM) 50 MG tablet  Every 6 hours PRN     09/03/17 1400    methylPREDNISolone (MEDROL DOSEPAK) 4 MG TBPK tablet     09/03/17 1400       Note:  This document was prepared using Dragon voice recognition software and may include unintentional dictation errors.    Sable Feil, PA-C 09/10/17 Yevette Edwards    Arta Silence, MD 09/12/17 2020

## 2017-09-03 NOTE — ED Notes (Signed)
ED Provider at bedside. 

## 2017-09-03 NOTE — ED Triage Notes (Addendum)
C/O left hip / leg pain with right foot swelling x 3 days.  And C/O posterior knee, thigh, and calf pain.  Denies injury.  States seen through ED a couple of weeks ago for c/o knee pain, diagnosed with osteoarthritis.

## 2017-09-03 NOTE — ED Notes (Signed)
Pt ambulated well to restroom with cane. In NAD at this time.

## 2017-09-03 NOTE — ED Notes (Signed)
Patient transported to XR. 
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
## 2017-09-13 ENCOUNTER — Other Ambulatory Visit: Payer: Self-pay | Admitting: Family Medicine

## 2017-09-13 DIAGNOSIS — M545 Low back pain: Secondary | ICD-10-CM

## 2017-09-18 ENCOUNTER — Ambulatory Visit
Admission: RE | Admit: 2017-09-18 | Discharge: 2017-09-18 | Disposition: A | Discharge status: Home / Self Care | Payer: Medicare HMO | Source: Ambulatory Visit | Attending: Family Medicine | Admitting: Family Medicine

## 2017-09-18 DIAGNOSIS — M545 Low back pain: Secondary | ICD-10-CM

## 2017-09-20 ENCOUNTER — Other Ambulatory Visit: Payer: Self-pay | Admitting: Family Medicine

## 2017-09-20 DIAGNOSIS — M545 Low back pain: Secondary | ICD-10-CM

## 2017-09-22 DIAGNOSIS — M170 Bilateral primary osteoarthritis of knee: Secondary | ICD-10-CM | POA: Insufficient documentation

## 2017-09-24 ENCOUNTER — Ambulatory Visit
Admission: RE | Admit: 2017-09-24 | Discharge: 2017-09-24 | Disposition: A | Discharge status: Home / Self Care | Payer: Medicare HMO | Source: Ambulatory Visit | Attending: Family Medicine | Admitting: Family Medicine

## 2017-09-24 DIAGNOSIS — M5440 Lumbago with sciatica, unspecified side: Secondary | ICD-10-CM | POA: Diagnosis not present

## 2017-09-24 DIAGNOSIS — Z981 Arthrodesis status: Secondary | ICD-10-CM | POA: Diagnosis not present

## 2017-09-24 DIAGNOSIS — M545 Low back pain: Secondary | ICD-10-CM

## 2017-09-29 ENCOUNTER — Ambulatory Visit: Payer: Medicare HMO

## 2017-11-30 ENCOUNTER — Ambulatory Visit: Payer: Medicare HMO | Admitting: Student in an Organized Health Care Education/Training Program

## 2018-02-07 ENCOUNTER — Telehealth: Payer: Self-pay | Admitting: *Deleted

## 2018-02-07 NOTE — Telephone Encounter (Signed)
Received a referral for initial lung cancer screening scan.  Contacted the patient and obtained their smoking history, patient currently smoking cigars and has only smoked cigars since he was 51 and prior to that only cigarettes once and a while less than 1 pack per week.  Patient currently ineligible for this scan.  Patient instructed and message sent to Dr. Ladoris Gene.

## 2018-06-12 ENCOUNTER — Other Ambulatory Visit: Payer: Self-pay

## 2018-06-12 ENCOUNTER — Emergency Department
Admission: EM | Admit: 2018-06-12 | Discharge: 2018-06-12 | Disposition: A | Discharge status: Home / Self Care | Payer: Medicare HMO | Attending: Emergency Medicine | Admitting: Emergency Medicine

## 2018-06-12 ENCOUNTER — Encounter: Payer: Self-pay | Admitting: Emergency Medicine

## 2018-06-12 DIAGNOSIS — M5441 Lumbago with sciatica, right side: Secondary | ICD-10-CM | POA: Insufficient documentation

## 2018-06-12 DIAGNOSIS — F17210 Nicotine dependence, cigarettes, uncomplicated: Secondary | ICD-10-CM | POA: Diagnosis not present

## 2018-06-12 DIAGNOSIS — J45909 Unspecified asthma, uncomplicated: Secondary | ICD-10-CM | POA: Diagnosis not present

## 2018-06-12 DIAGNOSIS — G8929 Other chronic pain: Secondary | ICD-10-CM | POA: Insufficient documentation

## 2018-06-12 DIAGNOSIS — M545 Low back pain: Secondary | ICD-10-CM

## 2018-06-12 DIAGNOSIS — M5442 Lumbago with sciatica, left side: Secondary | ICD-10-CM | POA: Insufficient documentation

## 2018-06-12 HISTORY — DX: Unspecified asthma, uncomplicated: J45.909

## 2018-06-12 HISTORY — DX: Fibromyalgia: M79.7

## 2018-06-12 MED ORDER — DEXAMETHASONE SODIUM PHOSPHATE 10 MG/ML IJ SOLN
10.0000 mg | Freq: Once | INTRAMUSCULAR | Status: AC
  Administered 2018-06-12: 10 mg via INTRAMUSCULAR
  Filled 2018-06-12: qty 1

## 2018-06-12 MED ORDER — PREDNISONE 10 MG PO TABS: ORAL_TABLET | ORAL | 0 refills | Status: DC

## 2018-06-12 MED ORDER — TRAMADOL HCL 50 MG PO TABS: 50.0000 mg | ORAL_TABLET | Freq: Four times a day (QID) | ORAL | 0 refills | Status: DC | PRN

## 2018-06-12 NOTE — ED Triage Notes (Signed)
Here for low back pain. Has had multiple back surgeries and has chronic back pain.  In past has sneezed and reports his back has went out.  Also has fibromyalgia.  Over past week has had increased in pain.  Normally when has episode like this needs coarse of pain meds per patient.  Feels like when sciatic flares.  Reports steroids don't seem to help.  No loss bowel or bladder.

## 2018-06-12 NOTE — ED Provider Notes (Signed)
[facility] Emergency Department Provider Note  ____________________________________________   First MD Initiated Contact with Patient 06/12/18 1259     (approximate)  I have reviewed the triage vital signs and the nursing notes.   HISTORY  Chief Complaint Back Pain   HPI Jack Blanchard is a 68 y.o. male   Libby Maw to the ED with complaint of low back pain.  Patient states that he has history of chronic back pain and also fibromyalgia.  He states that there is been no recent injury.  He states that his low back pain has slowly increased in intensity over the last week.  He denies any saddle anesthesias or incontinence of bowel or bladder.  Patient states that he is aware that he does have sciatica.  Patient currently is walking with a cane which is his normal.  Recently he rates his pain as an 8 out of 10.   Past Medical History:  Diagnosis Date  . Asthma   . Back pain   . Fibromyalgia   . History of degenerative disc disease     Patient Active Problem List   Diagnosis Date Noted  . Special screening for malignant neoplasms, colon     Past Surgical History:  Procedure Laterality Date  . BACK SURGERY     lumbar surgery with rods placed  . COLONOSCOPY WITH PROPOFOL N/A 08/03/2017   Procedure: COLONOSCOPY WITH PROPOFOL;  Surgeon: Lin Landsman, MD;  Location: [facility] ENDOSCOPY;  Service: Gastroenterology;  Laterality: N/A;  . LEG SURGERY      Prior to Admission medications   Medication Sig Start Date End Date Taking? Authorizing Provider  predniSONE (DELTASONE) 10 MG tablet Take 4 tablets once a day x 2 days, 3 tablets x 2 days 2 tablets x 2 days, 1 tablet x 2 days 06/12/18   Johnn Hai, PA-C  traMADol (ULTRAM) 50 MG tablet Take 1 tablet (50 mg total) by mouth every 6 (six) hours as needed. 06/12/18   Johnn Hai, PA-C    Allergies Vancomycin  History reviewed. No pertinent family history.  Social History Social History   Tobacco  Use  . Smoking status: Current Some Day Smoker  . Smokeless tobacco: Never Used  Substance Use Topics  . Alcohol use: Yes    Comment: 1/5 a week  . Drug use: Yes    Types: Marijuana    Review of Systems Constitutional: No fever/chills Cardiovascular: Denies chest pain. Respiratory: Denies shortness of breath. Gastrointestinal: No abdominal pain.  No nausea, no vomiting.  Genitourinary: Negative for dysuria. Musculoskeletal: Positive for chronic and acute back pain.  Positive for history of sciatica. Skin: Negative for rash. Neurological: Negative for headaches, focal weakness or numbness. ____________________________________________   PHYSICAL EXAM:  VITAL SIGNS: ED Triage Vitals  Enc Vitals Group     BP 06/12/18 1218 134/68     Pulse Rate 06/12/18 1218 61     Resp 06/12/18 1218 18     Temp 06/12/18 1218 98.1 F (36.7 C)     Temp Source 06/12/18 1218 Oral     SpO2 06/12/18 1218 95 %     Weight 06/12/18 1219 285 lb (129.3 kg)     Height 06/12/18 1219 5\' 11"  (1.803 m)     Head Circumference --      Peak Flow --      Pain Score 06/12/18 1219 8     Pain Loc --      Pain Edu? --  Excl. in [place]? --    Constitutional: Alert and oriented. Well appearing and in no acute distress. Eyes: Conjunctivae are normal.  Head: Atraumatic. Neck: No stridor.   Cardiovascular: Normal rate, regular rhythm. Grossly normal heart sounds.  Good peripheral circulation. Respiratory: Normal respiratory effort.  No retractions. Lungs CTAB. Gastrointestinal: Soft and nontender. No distention. Musculoskeletal: There is no point tenderness on palpation of the thoracic spine.  There is some minimal diffuse tenderness on palpation of the lumbar vertebra however there is marked tenderness on palpation of bilateral SI joint areas.  No evidence of injury or discoloration is noted in these areas.  Straight leg raises were negative.  Good muscle strength bilaterally.  Patient was ambulatory with the  assistance of a cane which is normal for him. Neurologic:  Normal speech and language. No gross focal neurologic deficits are appreciated.  Reflexes were 2+ bilaterally.  No gait instability. Skin:  Skin is warm, dry and intact.  No ecchymosis, erythema or abrasions were noted. Psychiatric: Mood and affect are normal. Speech and behavior are normal.  ____________________________________________   LABS (all labs ordered are listed, but only abnormal results are displayed)  Labs Reviewed - No data to display  PROCEDURES  Procedure(s) performed: None  Procedures  Critical Care performed: No  ____________________________________________   INITIAL IMPRESSION / ASSESSMENT AND PLAN / ED COURSE  As part of my medical decision making, I reviewed the following data within the electronic MEDICAL RECORD NUMBER Notes from prior ED visits and [place] Controlled Substance Database  Patient presents to the ED with complaint of acute on chronic low back pain with sciatica.  Patient has had a history of chronic back pain and MRI from 09/24/2017 was reviewed.  Patient states that there was no recent injury.  Patient was given Decadron 10 mg IM while in the department.  We discussed findings of his MRI which he was aware from his visit with his regular doctor.  Patient is agreeable to take a steroid taper pack along with tramadol which she has taken in the past.  He will contact his doctor if any continued problems or not improving.  Patient was ambulatory without any assistance except for his cane at the time of discharge.   ____________________________________________   FINAL CLINICAL IMPRESSION(S) / ED DIAGNOSES  Final diagnoses:  Chronic bilateral low back pain with bilateral sciatica  Acute exacerbation of chronic low back pain     ED Discharge Orders         Ordered    traMADol (ULTRAM) 50 MG tablet  Every 6 hours PRN     06/12/18 1338    predniSONE (DELTASONE) 10 MG tablet  Status:  Discontinued      06/12/18 1338    predniSONE (DELTASONE) 10 MG tablet     06/12/18 1344           Note:  This document was prepared using Dragon voice recognition software and may include unintentional dictation errors.    Johnn Hai, PA-C 06/12/18 1734    Arta Silence, MD 06/14/18 [phone]

## 2018-06-12 NOTE — Discharge Instructions (Signed)
Follow-up with your primary care provider if any continued problems with your back.  Also discuss with your primary care provider possibly sending you for physical therapy.  Begin taking medication as directed.  Your medication was sent to [facility] in [place].  The tramadol is for pain and should be taken only when you are not driving or planning to do a lot of walking.  This medication could cause drowsiness.  Also the prednisone is to be taken as directed on the label.  You may also use ice or heat to your back as needed for discomfort.

## 2018-08-01 ENCOUNTER — Encounter: Payer: Self-pay | Admitting: Nurse Practitioner

## 2018-08-01 ENCOUNTER — Ambulatory Visit: Payer: Medicare Other | Attending: Nurse Practitioner | Admitting: Nurse Practitioner

## 2018-08-01 ENCOUNTER — Other Ambulatory Visit: Payer: Self-pay

## 2018-08-01 DIAGNOSIS — G894 Chronic pain syndrome: Secondary | ICD-10-CM | POA: Diagnosis present

## 2018-08-01 DIAGNOSIS — M25561 Pain in right knee: Secondary | ICD-10-CM | POA: Insufficient documentation

## 2018-08-01 DIAGNOSIS — M5442 Lumbago with sciatica, left side: Secondary | ICD-10-CM | POA: Insufficient documentation

## 2018-08-01 DIAGNOSIS — M899 Disorder of bone, unspecified: Secondary | ICD-10-CM | POA: Insufficient documentation

## 2018-08-01 DIAGNOSIS — M5441 Lumbago with sciatica, right side: Secondary | ICD-10-CM | POA: Diagnosis present

## 2018-08-01 DIAGNOSIS — G8929 Other chronic pain: Secondary | ICD-10-CM | POA: Insufficient documentation

## 2018-08-01 DIAGNOSIS — M79605 Pain in left leg: Secondary | ICD-10-CM | POA: Insufficient documentation

## 2018-08-01 DIAGNOSIS — M79604 Pain in right leg: Secondary | ICD-10-CM | POA: Insufficient documentation

## 2018-08-01 DIAGNOSIS — Z79899 Other long term (current) drug therapy: Secondary | ICD-10-CM | POA: Insufficient documentation

## 2018-08-01 DIAGNOSIS — Z789 Other specified health status: Secondary | ICD-10-CM | POA: Insufficient documentation

## 2018-08-01 DIAGNOSIS — M79609 Pain in unspecified limb: Secondary | ICD-10-CM | POA: Insufficient documentation

## 2018-08-01 NOTE — Patient Instructions (Addendum)
____________________________________________________________________________________________  Appointment Policy Summary  It is our goal and responsibility to provide the medical community with assistance in the evaluation and management of patients with chronic pain. Unfortunately our resources are limited. Because we do not have an unlimited amount of time, or available appointments, we are required to closely monitor and manage their use. The following rules exist to maximize their use:  Patient's responsibilities: 1. Punctuality:  At what time should I arrive? You should be physically present in our office 30 minutes before your scheduled appointment. Your scheduled appointment is with your assigned healthcare provider. However, it takes 5-10 minutes to be "checked-in", and another 15 minutes for the nurses to do the admission. If you arrive to our office at the time you were given for your appointment, you will end up being at least 20-25 minutes late to your appointment with the provider. 2. Tardiness:  What happens if I arrive only a few minutes after my scheduled appointment time? You will need to reschedule your appointment. The cutoff is your appointment time. This is why it is so important that you arrive at least 30 minutes before that appointment. If you have an appointment scheduled for 10:00 AM and you arrive at 10:01, you will be required to reschedule your appointment.  3. Plan ahead:  Always assume that you will encounter traffic on your way in. Plan for it. If you are dependent on a driver, make sure they understand these rules and the need to arrive early. 4. Other appointments and responsibilities:  Avoid scheduling any other appointments before or after your pain clinic appointments.  5. Be prepared:  Write down everything that you need to discuss with your healthcare provider and give this information to the admitting nurse. Write down the medications that you will need  refilled. Bring your pills and bottles (even the empty ones), to all of your appointments, except for those where a procedure is scheduled. 6. No children or pets:  Find someone to take care of them. It is not appropriate to bring them in. 7. Scheduling changes:  We request "advanced notification" of any changes or cancellations. 8. Advanced notification:  Defined as a time period of more than 24 hours prior to the originally scheduled appointment. This allows for the appointment to be offered to other patients. 9. Rescheduling:  When a visit is rescheduled, it will require the cancellation of the original appointment. For this reason they both fall within the category of "Cancellations".  10. Cancellations:  They require advanced notification. Any cancellation less than 24 hours before the  appointment will be recorded as a "No Show". 11. No Show:  Defined as an unkept appointment where the patient failed to notify or declare to the practice their intention or inability to keep the appointment.  Corrective process for repeat offenders:  1. Tardiness: Three (3) episodes of rescheduling due to late arrivals will be recorded as one (1) "No Show". 2. Cancellation or reschedule: Three (3) cancellations or rescheduling will be recorded as one (1) "No Show". 3. "No Shows": Three (3) "No Shows" within a 12 month period will result in discharge from the practice. ____________________________________________________________________________________________   ______________________________________________________________________________________________  Specialty Pain Scale  Introduction:  There are significant differences in how pain is reported. The word pain usually refers to physical pain, but it is also a common synonym of suffering. The medical community uses a scale from 0 (zero) to 10 (ten) to report pain level. Zero (0) is described as "no pain",   while ten (10) is described as "the worse pain  you can imagine". The problem with this scale is that physical pain is reported along with suffering. Suffering refers to mental pain, or more often yet it refers to any unpleasant feeling, emotion or aversion associated with the perception of harm or threat of harm. It is the psychological component of pain.  Pain Specialists prefer to separate the two components. The pain scale used by this practice is the Verbal Numerical Rating Scale (VNRS-11). This scale is for the physical pain only. DO NOT INCLUDE how your pain psychologically affects you. This scale is for adults 21 years of age and older. It has 11 (eleven) levels. The 1st level is 0/10. This means: "right now, I have no pain". In the context of pain management, it also means: "right now, my physical pain is under control with the current therapy".  General Information:  The scale should reflect your current level of pain. Unless you are specifically asked for the level of your worst pain, or your average pain. If you are asked for one of these two, then it should be understood that it is over the past 24 hours.  Levels 1 (one) through 5 (five) are described below, and can be treated as an outpatient. Ambulatory pain management facilities such as ours are more than adequate to treat these levels. Levels 6 (six) through 10 (ten) are also described below, however, these must be treated as a hospitalized patient. While levels 6 (six) and 7 (seven) may be evaluated at an urgent care facility, levels 8 (eight) through 10 (ten) constitute medical emergencies and as such, they belong in a hospital's emergency department. When having these levels (as described below), do not come to our office. Our facility is not equipped to manage these levels. Go directly to an urgent care facility or an emergency department to be evaluated.  Definitions:  Activities of Daily Living (ADL): Activities of daily living (ADL or ADLs) is a term used in healthcare to refer to  people's daily self-care activities. Health professionals often use a person's ability or inability to perform ADLs as a measurement of their functional status, particularly in regard to people post injury, with disabilities and the elderly. There are two ADL levels: Basic and Instrumental. Basic Activities of Daily Living (BADL  or BADLs) consist of self-care tasks that include: Bathing and showering; personal hygiene and grooming (including brushing/combing/styling hair); dressing; Toilet hygiene (getting to the toilet, cleaning oneself, and getting back up); eating and self-feeding (not including cooking or chewing and swallowing); functional mobility, often referred to as "transferring", as measured by the ability to walk, get in and out of bed, and get into and out of a chair; the broader definition (moving from one place to another while performing activities) is useful for people with different physical abilities who are still able to get around independently. Basic ADLs include the things many people do when they get up in the morning and get ready to go out of the house: get out of bed, go to the toilet, bathe, dress, groom, and eat. On the average, loss of function typically follows a particular order. Hygiene is the first to go, followed by loss of toilet use and locomotion. The last to go is the ability to eat. When there is only one remaining area in which the person is independent, there is a 62.9% chance that it is eating and only a 3.5% chance that it is hygiene. Instrumental Activities   of Daily Living (IADL or IADLs) are not necessary for fundamental functioning, but they let an individual live independently in a community. IADL consist of tasks that include: cleaning and maintaining the house; home establishment and maintenance; care of others (including selecting and supervising caregivers); care of pets; child rearing; managing money; managing financials (investments, etc.); meal preparation  and cleanup; shopping for groceries and necessities; moving within the community; safety procedures and emergency responses; health management and maintenance (taking prescribed medications); and using the telephone or other form of communication.  Instructions:  Most patients tend to report their pain as a combination of two factors, their physical pain and their psychosocial pain. This last one is also known as "suffering" and it is reflection of how physical pain affects you socially and psychologically. From now on, report them separately.  From this point on, when asked to report your pain level, report only your physical pain. Use the following table for reference.  Pain Clinic Pain Levels (0-5/10)  Pain Level Score  Description  No Pain 0   Mild pain 1 Nagging, annoying, but does not interfere with basic activities of daily living (ADL). Patients are able to eat, bathe, get dressed, toileting (being able to get on and off the toilet and perform personal hygiene functions), transfer (move in and out of bed or a chair without assistance), and maintain continence (able to control bladder and bowel functions). Blood pressure and heart rate are unaffected. A normal heart rate for a healthy adult ranges from 60 to 100 bpm (beats per minute).   Mild to moderate pain 2 Noticeable and distracting. Impossible to hide from other people. More frequent flare-ups. Still possible to adapt and function close to normal. It can be very annoying and may have occasional stronger flare-ups. With discipline, patients may get used to it and adapt.   Moderate pain 3 Interferes significantly with activities of daily living (ADL). It becomes difficult to feed, bathe, get dressed, get on and off the toilet or to perform personal hygiene functions. Difficult to get in and out of bed or a chair without assistance. Very distracting. With effort, it can be ignored when deeply involved in activities.   Moderately severe pain  4 Impossible to ignore for more than a few minutes. With effort, patients may still be able to manage work or participate in some social activities. Very difficult to concentrate. Signs of autonomic nervous system discharge are evident: dilated pupils (mydriasis); mild sweating (diaphoresis); sleep interference. Heart rate becomes elevated (>115 bpm). Diastolic blood pressure (lower number) rises above 100 mmHg. Patients find relief in laying down and not moving.   Severe pain 5 Intense and extremely unpleasant. Associated with frowning face and frequent crying. Pain overwhelms the senses.  Ability to do any activity or maintain social relationships becomes significantly limited. Conversation becomes difficult. Pacing back and forth is common, as getting into a comfortable position is nearly impossible. Pain wakes you up from deep sleep. Physical signs will be obvious: pupillary dilation; increased sweating; goosebumps; brisk reflexes; cold, clammy hands and feet; nausea, vomiting or dry heaves; loss of appetite; significant sleep disturbance with inability to fall asleep or to remain asleep. When persistent, significant weight loss is observed due to the complete loss of appetite and sleep deprivation.  Blood pressure and heart rate becomes significantly elevated. Caution: If elevated blood pressure triggers a pounding headache associated with blurred vision, then the patient should immediately seek attention at an urgent or emergency care unit, as   these may be signs of an impending stroke.    Emergency Department Pain Levels (6-10/10)  Emergency Room Pain 6 Severely limiting. Requires emergency care and should not be seen or managed at an outpatient pain management facility. Communication becomes difficult and requires great effort. Assistance to reach the emergency department may be required. Facial flushing and profuse sweating along with potentially dangerous increases in heart rate and blood pressure  will be evident.   Distressing pain 7 Self-care is very difficult. Assistance is required to transport, or use restroom. Assistance to reach the emergency department will be required. Tasks requiring coordination, such as bathing and getting dressed become very difficult.   Disabling pain 8 Self-care is no longer possible. At this level, pain is disabling. The individual is unable to do even the most "basic" activities such as walking, eating, bathing, dressing, transferring to a bed, or toileting. Fine motor skills are lost. It is difficult to think clearly.   Incapacitating pain 9 Pain becomes incapacitating. Thought processing is no longer possible. Difficult to remember your own name. Control of movement and coordination are lost.   The worst pain imaginable 10 At this level, most patients pass out from pain. When this level is reached, collapse of the autonomic nervous system occurs, leading to a sudden drop in blood pressure and heart rate. This in turn results in a temporary and dramatic drop in blood flow to the brain, leading to a loss of consciousness. Fainting is one of the body's self defense mechanisms. Passing out puts the brain in a calmed state and causes it to shut down for a while, in order to begin the healing process.    Summary: 1.   Refer to this scale when providing [place] with your pain level. 2.   Be accurate and careful when reporting your pain level. This will help with your care. 3.   Over-reporting your pain level will lead to loss of credibility. 4.   Even a level of 1/10 means that there is pain and will be treated at our facility. 5.   High, inaccurate reporting will be documented as "Symptom Exaggeration", leading to loss of credibility and suspicions of possible secondary gains such as obtaining more narcotics, or wanting to appear disabled, for fraudulent reasons. 6.   Only pain levels of 5 or below will be seen at our facility. 7.   Pain levels of 6 and above will be  sent to the Emergency Department and the appointment cancelled.  ______________________________________________________________________________________________   BMI Assessment: Estimated body mass index is 36.26 kg/m as calculated from the following:   Height as of this encounter: 5\' 11"  (1.803 m).   Weight as of this encounter: 260 lb (117.9 kg).  BMI interpretation table: BMI level Category Range association with higher incidence of chronic pain  <18 kg/m2 Underweight   18.5-24.9 kg/m2 Ideal body weight   25-29.9 kg/m2 Overweight Increased incidence by 20%  30-34.9 kg/m2 Obese (Class I) Increased incidence by 68%  35-39.9 kg/m2 Severe obesity (Class II) Increased incidence by 136%  >40 kg/m2 Extreme obesity (Class III) Increased incidence by 254%   BMI Readings from Last 4 Encounters:  08/01/18 36.26 kg/m  06/12/18 39.75 kg/m  09/03/17 39.05 kg/m  08/03/17 40.59 kg/m   Wt Readings from Last 4 Encounters:  08/01/18 260 lb (117.9 kg)  06/12/18 285 lb (129.3 kg)  09/03/17 280 lb (127 kg)  08/03/17 291 lb (132 kg)

## 2018-08-01 NOTE — Progress Notes (Signed)
Patient's Name: Jack Blanchard  MRN: [id number]  Referring Provider: Henrietta Dine, MD  DOB: [birth year]  PCP: Theotis Burrow, MD  DOS: 08/01/2018  Note by: Dionisio David NP  Service setting: Ambulatory outpatient  Specialty: Interventional Pain Management  Location: [facility] (AMB) Pain Management Facility    Patient type: New Patient    Primary Reason(s) for Visit: Initial Patient Evaluation CC: Back Pain  HPI  Jack Blanchard is a 68 y.o. year old, male patient, who comes today for an initial evaluation. He has Special screening for malignant neoplasms, colon; Chronic midline low back pain with right-sided sciatica; Primary osteoarthritis of both knees; Chronic pain syndrome; Pharmacologic therapy; Disorder of skeletal system; Problems influencing health status; Chronic bilateral low back pain with bilateral sciatica (Secondary Area of Pain) (R>L); Chronic pain of both lower extremities (Primary Area of Pain) (R>L); and Chronic pain of right knee (Tertiary Area of Pain) on their problem list.. His primarily concern today is the Back Pain  Pain Assessment: Location: Lower Back Radiating: pain radiaties down both leg to toes Onset: More than a month ago Duration: Chronic pain Quality: Constant, Numbness, Tingling, Aching, Burning Severity: 5 /10 (subjective, self-reported pain score)  Note: Reported level is compatible with observation.                          Effect on ADL: unable to do anything Timing: Constant Modifying factors: medication, rest, heating pad, ice BP: (!) 146/100  HR: 60  Onset and Duration: Gradual and Date of onset: 20 years ago Cause of pain: Unknown Severity: Getting worse, NAS-11 at its worse: 12/10, NAS-11 at its best: 6/10, NAS-11 now: 6/10 and NAS-11 on the average: 6/10 Timing: Morning, Noon, Afternoon, Evening, Night, Not influenced by the time of the day, During activity or exercise, After activity or exercise and After a period of immobility Aggravating  Factors: Bending, Climbing, Intercourse (sex), Kneeling, Lifiting, Motion, Nerve blocks, Prolonged sitting, Prolonged standing, Squatting, Stooping , Surgery made it worse, Twisting, Walking, Walking uphill, Walking downhill and Working Alleviating Factors: Bending, Cold packs, Hot packs, Sleeping, TENS and Warm showers or baths Associated Problems: Day-time cramps, Night-time cramps, Erectile dysfunction, Inability to concentrate, Spasms, Swelling, Tingling and Weakness Quality of Pain: Aching, Burning, Cramping, Heavy, Sharp and Stabbing Previous Examinations or Tests: MRI scan, X-rays, Nerve conduction test, Neurological evaluation, Neurosurgical evaluation and Chiropractic evaluation Previous Treatments: Morphine pump, Physical Therapy, Steroid treatments by mouth, Strengthening exercises and TENS  The patient comes into the clinics today for the first time for a chronic pain management evaluation. According to the patient his primary area of pain is in his legs and feet.  He denies any precipitating factors.  He does have numbness tingling and burning type pain with weakness. He admits that the right is greater than the left He says the pain goes down both sides and fronts of his legs into his ankles.  He does have swelling in his feet and ankles.  He is currently in physical therapy this is a second visit.  He denies previous nerve conduction study.  Second area pain is in his low back.  He denies one side being worse than the other.  He feels the pain is equal.  He is status post discectomy which was done in [facility] approximately 12 years ago and spinal fusion last approximately 10 years ago Dr. Nyra Capes in [place].  He admits that he did have 1 injection approximately 12 years  ago however this caused a headache so he did not have any additional.  Again he is in physical therapy.  He has had MRI 2019.  Third area of pain is in his right knee.  He admits that he has been seen by orthopedist in  the past and needs a knee replacement.  He has had steroid injections in the past which were not effective.  Again he is currently in physical therapy.  He has had recent images.  Today I took the time to provide the patient with information regarding this pain practice. The patient was informed that the practice is divided into two sections: an interventional pain management section, as well as a completely separate and distinct medication management section. I explained that there are procedure days for interventional therapies, and evaluation days for follow-ups and medication management. Because of the amount of documentation required during both, they are kept separated. This means that there is the possibility that he may be scheduled for a procedure on one day, and medication management the next. I have also informed him that because of staffing and facility limitations, this practice will no longer take patients for medication management only. To illustrate the reasons for this, I gave the patient the example of surgeons, and how inappropriate it would be to refer a patient to his/her care, just to write for the post-surgical antibiotics on a surgery done by a different surgeon.   Because interventional pain management is part of the board-certified specialty for the doctors, the patient was informed that joining this practice means that they are open to any and all interventional therapies. I made it clear that this does not mean that they will be forced to have any procedures done. What this means is that I believe interventional therapies to be essential part of the diagnosis and proper management of chronic pain conditions. Therefore, patients not interested in these interventional alternatives will be better served under the care of a different practitioner.  The patient was also made aware of my Comprehensive Pain Management Safety Guidelines where by joining this practice, they limit all of  their nerve blocks and joint injections to those done by our practice, for as long as we are retained to manage their care. Historic Controlled Substance Pharmacotherapy Review  PMP and historical list of controlled substances: Tramadol 50 mg, alprazolam 1 mg, alprazolam 0.5 mg, hydrocodone/acetaminophen 5/325 mg, Highest opioid analgesic regimen found: Hydrocodone/acetaminophen 5/325 mg 1 tablet 5 times daily (fill date 03/09/2016) hydrocodone 25 mg/day Most recent opioid analgesic: Tramadol 50 mg 1 tablet 3 times daily (fill date 06/12/2018) tramadol 50 mg/day Current opioid analgesics: None Highest recorded MME/day: 25 mg/day MME/day: 0 mg/day Medications: The patient did not bring the medication(s) to the appointment, as requested in our "New Patient Package" Pharmacodynamics: Desired effects: Analgesia: The patient reports >50% benefit. Reported improvement in function: The patient reports medication allows him to accomplish basic ADLs. Clinically meaningful improvement in function (CMIF): Sustained CMIF goals met Perceived effectiveness: Described as relatively effective, allowing for increase in activities of daily living (ADL) Undesirable effects: Side-effects or Adverse reactions: None reported Historical Monitoring: The patient  reports current drug use. Drug: Marijuana. List of all UDS Test(s): No results found for: MDMA, COCAINSCRNUR, PCPSCRNUR, PCPQUANT, CANNABQUANT, THCU, [place] List of all Serum Drug Screening Test(s):  No results found for: AMPHSCRSER, BARBSCRSER, BENZOSCRSER, COCAINSCRSER, PCPSCRSER, PCPQUANT, THCSCRSER, CANNABQUANT, OPIATESCRSER, OXYSCRSER, PROPOXSCRSER Historical Background Evaluation: [place] PDMP: Six (6) year initial data search conducted.  [place] Department of public safety, offender search: Editor, commissioning Information) Non-contributory Risk Assessment Profile: Aberrant behavior: None observed or detected today Risk factors for fatal opioid overdose: None  identified today Fatal overdose hazard ratio (HR): Calculation deferred Non-fatal overdose hazard ratio (HR): Calculation deferred Risk of opioid abuse or dependence: 0.7-3.0% with doses ? 36 MME/day and 6.1-26% with doses ? 120 MME/day. Substance use disorder (SUD) risk level: Pending results of Medical Psychology Evaluation for SUD Opioid risk tool (ORT) (Total Score): 0  ORT Scoring interpretation table:  Score <3 = Low Risk for SUD  Score between 4-7 = Moderate Risk for SUD  Score >8 = High Risk for Opioid Abuse   PHQ-2 Depression Scale:  Total score:    PHQ-2 Scoring interpretation table: (Score and probability of major depressive disorder)  Score 0 = No depression  Score 1 = 15.4% Probability  Score 2 = 21.1% Probability  Score 3 = 38.4% Probability  Score 4 = 45.5% Probability  Score 5 = 56.4% Probability  Score 6 = 78.6% Probability   PHQ-9 Depression Scale:  Total score:    PHQ-9 Scoring interpretation table:  Score 0-4 = No depression  Score 5-9 = Mild depression  Score 10-14 = Moderate depression  Score 15-19 = Moderately severe depression  Score 20-27 = Severe depression (2.4 times higher risk of SUD and 2.89 times higher risk of overuse)   Pharmacologic Plan: Pending ordered tests and/or consults  Meds  The patient has a current medication list which includes the following prescription(s): albuterol, baclofen, celecoxib, cholecalciferol, duloxetine, and tamsulosin.  Current Outpatient Medications on File Prior to Visit  Medication Sig  . albuterol (PROVENTIL) 2 MG tablet Take 2 mg by mouth 3 (three) times daily.  . baclofen (LIORESAL) 10 MG tablet Take 10 mg by mouth 3 (three) times daily.  . celecoxib (CELEBREX) 200 MG capsule Take 200 mg by mouth 2 (two) times daily.  . cholecalciferol (VITAMIN D3) 25 MCG (1000 UT) tablet Take 1,000 Units by mouth daily.  . DULoxetine (CYMBALTA) 60 MG capsule Take 60 mg by mouth daily.  . tamsulosin (FLOMAX) 0.4 MG CAPS  capsule Take 0.4 mg by mouth.   No current facility-administered medications on file prior to visit.    Imaging Review  Lumbosacral Imaging: Lumbar MR wo contrast:  Results for orders placed during the hospital encounter of 09/24/17  MR LUMBAR SPINE WO CONTRAST   Narrative CLINICAL DATA:  Low back pain and bilateral leg pain right worse than left. Patient was unable to tolerate complete imaging because of pain. All images suffer from motion degradation.  EXAM: MRI LUMBAR SPINE WITHOUT CONTRAST  TECHNIQUE: Multiplanar, multisequence MR imaging of the lumbar spine was performed. No intravenous contrast was administered.  COMPARISON:  Radiography 03/09/2016  FINDINGS: Segmentation: S1 has some transitional features. Same numbering terminology as used previously.  Alignment:  Within normal limits.  Vertebrae: Distant PLIF L5-S1. No fracture or other focal bone finding.  Conus medullaris and cauda equina: Conus extends to the L1-2 level. Conus and cauda equina appear normal.  Paraspinal and other soft tissues: Negative  Disc levels:  T12-L1: Normal.  L1-2: Bulging of the disc.  No likely compressive stenosis.  L2-3: Bulging of the disc. Mild facet hypertrophy. Mild canal stenosis.  L3-4: Bulging of the disc. Facet and ligamentous hypertrophy. Mild canal stenosis.  L4-5: Mild bulging of the disc. Facet and ligamentous hypertrophy. Mild lateral recess and foraminal stenosis left worse than right.  L5-S1: Previous PLIF. Wide patency  of the canal and foramina. No complicating feature seen.  IMPRESSION: Motion degraded and abbreviated exam because of patient pain.  Satisfactory appearance at the previous decompression and fusion level of L5-S1.  Disc bulges and facet hypertrophy at L1-2, L2-3 and L3-4. Findings at these levels could certainly be associated with back pain. Distinct neural compression is not demonstrated however. Some potential for stenotic  symptoms.  L4-5 facet degeneration and mild bulging of the disc. Mild narrowing of the lateral recesses and foramina left more than right.   Electronically Signed   By: Nelson Chimes M.D.   On: 09/25/2017 07:50   Lumbar DG 2-3 views:  Results for orders placed during the hospital encounter of 03/09/16  DG Lumbar Spine 2-3 Views   Narrative CLINICAL DATA:  Back pain for 7 months, increased today  EXAM: LUMBAR SPINE - 2-3 VIEW  COMPARISON:  None.  FINDINGS: L5-S1 solid fusion.  Rod and pedicle screws are unremarkable.  Degenerative disc disease with diffuse narrowing, focally advanced at L2-3 and L3-4. Lumbar facet arthropathy with spurring greatest at L4-5, where there is slight anterolisthesis. No evidence of fracture or focal bone lesion. Right hip osteoarthritis with joint narrowing, spurring, and subchondral cysts.  IMPRESSION: 1. No acute finding. 2. Diffuse lumbar disc and facet degeneration as described. 3. L5-S1 solid bony fusion.   Electronically Signed   By: Monte Fantasia M.D.   On: 03/09/2016 13:58   Hip Imaging:  Hip-L DG 2-3 views:  Results for orders placed during the hospital encounter of 09/03/17  DG Hip Unilat W or Wo Pelvis 2-3 Views Left   Narrative CLINICAL DATA:  Pt reports increasing left hip pain over the past three days. Pt says that his ankle has been swollen and his toes feel numb. Denies any injury, able to bear weight but states that he cannot do it for an extended period of time  EXAM: DG HIP (WITH OR WITHOUT PELVIS) 2-3V LEFT  COMPARISON:  03/09/2016  FINDINGS: There is no evidence of hip fracture or dislocation. There is no evidence of arthropathy or other focal bone abnormality. Lower lumbar fixation hardware noted.  IMPRESSION: Negative.   Electronically Signed   By: Lucrezia Europe M.D.   On: 09/03/2017 13:57     Knee Imaging:  Results for orders placed during the hospital encounter of 08/10/17  DG Knee Complete 4  Views Right   Narrative CLINICAL DATA:  Bilateral knee pain for many years, worse recently, no injury  EXAM: RIGHT KNEE - COMPLETE 4+ VIEW  COMPARISON:  None.  FINDINGS: There is tricompartmental degenerative joint disease of the right knee with some spurring from each compartment appear however primary loss of joint space involves the patellofemoral compartment with almost complete loss of joint space and spurring. No acute fracture is seen and no joint effusion is noted. The patella is slightly laterally positioned.  IMPRESSION: 1. 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 2. No fracture or effusion. 3. The patella is laterally positioned.   Electronically Signed   By: Ivar Drape M.D.   On: 08/10/2017 17:25    Knee-L DG 4 views:  Results for orders placed during the hospital encounter of 08/10/17  DG Knee Complete 4 Views Left   Narrative CLINICAL DATA:  Bilateral knee pain for over 10 years, worse recently, no injury  EXAM: LEFT KNEE - COMPLETE 4+ VIEW  COMPARISON:  None.  FINDINGS: There is tricompartmental degenerative joint disease of the  left knee. There is spurring from each compartment, but the primary loss of joint space involves the patellofemoral compartment with almost complete loss of joint space and spurring. No fracture is seen and no joint effusion is noted. However there is a loose body in the posterior joint space.  IMPRESSION: 1. Tricompartmental degenerative joint disease of the left knee primarily involving the patellofemoral compartment. 2. No fracture or effusion. 3. Loose bodies in the joint space.   Electronically Signed   By: Ivar Drape M.D.   On: 08/10/2017 17:24     Note: Available results from prior imaging studies were reviewed.        ROS  Cardiovascular History: No reported cardiovascular signs or symptoms such as High blood pressure, coronary  artery disease, abnormal heart rate or rhythm, heart attack, blood thinner therapy or heart weakness and/or failure Pulmonary or Respiratory History: Wheezing and difficulty taking a deep full breath (Asthma), Shortness of breath and Snoring  Neurological History: No reported neurological signs or symptoms such as seizures, abnormal skin sensations, urinary and/or fecal incontinence, being born with an abnormal open spine and/or a tethered spinal cord Review of Past Neurological Studies: No results found for this or any previous visit. Psychological-Psychiatric History: No reported psychological or psychiatric signs or symptoms such as difficulty sleeping, anxiety, depression, delusions or hallucinations (schizophrenial), mood swings (bipolar disorders) or suicidal ideations or attempts Gastrointestinal History: No reported gastrointestinal signs or symptoms such as vomiting or evacuating blood, reflux, heartburn, alternating episodes of diarrhea and constipation, inflamed or scarred liver, or pancreas or irrregular and/or infrequent bowel movements Genitourinary History: No reported renal or genitourinary signs or symptoms such as difficulty voiding or producing urine, peeing blood, non-functioning kidney, kidney stones, difficulty emptying the bladder, difficulty controlling the flow of urine, or chronic kidney disease Hematological History: No reported hematological signs or symptoms such as prolonged bleeding, low or poor functioning platelets, bruising or bleeding easily, hereditary bleeding problems, low energy levels due to low hemoglobin or being anemic Endocrine History: No reported endocrine signs or symptoms such as high or low blood sugar, rapid heart rate due to high thyroid levels, obesity or weight gain due to slow thyroid or thyroid disease Rheumatologic History: No reported rheumatological signs and symptoms such as fatigue, joint pain, tenderness, swelling, redness, heat, stiffness,  decreased range of motion, with or without associated rash Musculoskeletal History: Negative for myasthenia gravis, muscular dystrophy, multiple sclerosis or malignant hyperthermia Work History: Disabled  Allergies  Mr. Partain is allergic to vancomycin.  Laboratory Chemistry  Inflammation Markers Lab Results  Component Value Date   ESRSEDRATE 5 09/03/2017   (CRP: Acute Phase) (ESR: Chronic Phase) Renal Function Markers Lab Results  Component Value Date   BUN 18 09/03/2017   CREATININE 0.76 09/03/2017   GFRAA >60 09/03/2017   GFRNONAA >60 09/03/2017   Hepatic Function Markers No results found for: AST, ALT, ALBUMIN, ALKPHOS, HCVAB Electrolytes Lab Results  Component Value Date   NA 139 09/03/2017   K 3.6 09/03/2017   CL 110 09/03/2017   CALCIUM 8.8 (L) 09/03/2017   Neuropathy Markers No results found for: [id number] Bone Pathology Markers Lab Results  Component Value Date   CALCIUM 8.8 (L) 09/03/2017   Coagulation Parameters Lab Results  Component Value Date   PLT 192 09/03/2017   Cardiovascular Markers Lab Results  Component Value Date   HGB 14.5 09/03/2017   HCT 42.4 09/03/2017   Note: Lab results reviewed.  [place]  Drug: Mr. Capek  reports  current drug use. Drug: Marijuana. Alcohol:  reports current alcohol use. Tobacco:  reports that he has been smoking. He has never used smokeless tobacco. Medical:  has a past medical history of Asthma, Back pain, Fibromyalgia, and History of degenerative disc disease. Family: family history is not on file.  Past Surgical History:  Procedure Laterality Date  . BACK SURGERY     lumbar surgery with rods placed  . COLONOSCOPY WITH PROPOFOL N/A 08/03/2017   Procedure: COLONOSCOPY WITH PROPOFOL;  Surgeon: Lin Landsman, MD;  Location: [facility] ENDOSCOPY;  Service: Gastroenterology;  Laterality: N/A;  . LEG SURGERY     Active Ambulatory Problems    Diagnosis Date Noted  . Special screening for malignant neoplasms, colon    . Chronic midline low back pain with right-sided sciatica 04/24/2016  . Primary osteoarthritis of both knees 09/22/2017  . Chronic pain syndrome 08/01/2018  . Pharmacologic therapy 08/01/2018  . Disorder of skeletal system 08/01/2018  . Problems influencing health status 08/01/2018  . Chronic bilateral low back pain with bilateral sciatica (Secondary Area of Pain) (R>L) 08/01/2018  . Chronic pain of both lower extremities (Primary Area of Pain) (R>L) 08/01/2018  . Chronic pain of right knee [facility] Area of Pain) 08/01/2018   Resolved Ambulatory Problems    Diagnosis Date Noted  . No Resolved Ambulatory Problems   Past Medical History:  Diagnosis Date  . Asthma   . Back pain   . Fibromyalgia   . History of degenerative disc disease    Constitutional Exam  General appearance: Well nourished, well developed, and well hydrated. In no apparent acute distress Vitals:   08/01/18 1013  BP: (!) 146/100  Pulse: 60  Temp: 98.2 F (36.8 C)  SpO2: 100%  Weight: 260 lb (117.9 kg)  Height: _0  (1.803 m)   BMI Assessment: Estimated body mass index is 36.26 kg/m as calculated from the following:   Height as of this encounter: _1  (1.803 m).   Weight as of this encounter: 260 lb (117.9 kg).  BMI interpretation table: BMI level Category Range association with higher incidence of chronic pain  <18 kg/m2 Underweight   18.5-24.9 kg/m2 Ideal body weight   25-29.9 kg/m2 Overweight Increased incidence by 20%  30-34.9 kg/m2 Obese (Class I) Increased incidence by 68%  35-39.9 kg/m2 Severe obesity (Class II) Increased incidence by 136%  >40 kg/m2 Extreme obesity (Class III) Increased incidence by 254%   BMI Readings from Last 4 Encounters:  08/01/18 36.26 kg/m  06/12/18 39.75 kg/m  09/03/17 39.05 kg/m  08/03/17 40.59 kg/m   Wt Readings from Last 4 Encounters:  08/01/18 260 lb (117.9 kg)  06/12/18 285 lb (129.3 kg)  09/03/17 280 lb (127 kg)  08/03/17 291 lb (132 kg)   Psych/Mental status: Alert, oriented x 3 (person, place, & time)       Eyes: PERLA Respiratory: No evidence of acute respiratory distress  Cervical Spine Exam  Inspection: No masses, redness, or swelling Alignment: Symmetrical Functional ROM: Unrestricted ROM      Stability: No instability detected Muscle strength & Tone: Functionally intact Sensory: Unimpaired Palpation: No palpable anomalies              Upper Extremity (UE) Exam    Side: Right upper extremity  Side: Left upper extremity  Inspection: No masses, redness, swelling, or asymmetry. No contractures  Inspection: No masses, redness, swelling, or asymmetry. No contractures  Functional ROM: Unrestricted ROM  Functional ROM: Unrestricted ROM          Muscle strength & Tone: Functionally intact  Muscle strength & Tone: Functionally intact  Sensory: Unimpaired  Sensory: Unimpaired  Palpation: No palpable anomalies              Palpation: No palpable anomalies              Specialized Test(s): Deferred         Specialized Test(s): Deferred          Thoracic Spine Exam  Inspection: No masses, redness, or swelling Alignment: Symmetrical Functional ROM: Unrestricted ROM Stability: No instability detected Sensory: Unimpaired Muscle strength & Tone: No palpable anomalies  Lumbar Spine Exam  Inspection: Well healed scar from previous spine surgery detected Alignment: Symmetrical Functional ROM: Adequate ROM      Stability: No instability detected Muscle strength & Tone: Functionally intact Sensory: Movement-associated discomfort Palpation: Complains of area being tender to palpation       Provocative Tests: Lumbar Hyperextension and rotation test: Negative       Patrick's Maneuver: Negative                    Gait & Posture Assessment  Ambulation: Unassisted Gait: Relatively normal for age and body habitus Posture: WNL   Lower Extremity Exam    Side: Right lower extremity  Side: Left lower extremity   Inspection: No masses, redness, swelling, or asymmetry. No contractures  Inspection: No masses, redness, swelling, or asymmetry. No contractures  Functional ROM: Adequate ROM for all joints of the lower extremity  Functional ROM: Adequate ROM for all joints of the lower extremity  Muscle strength & Tone: Able to Toe-walk & Heel-walk without problems  Muscle strength & Tone: Able to Toe-walk & Heel-walk without problems  Sensory: Dermatomal pain pattern  Sensory: Dermatomal pain pattern  Palpation: No palpable anomalies 2+ pulses  Palpation: No palpable anomalies 2+ pulses   Assessment  Primary Diagnosis & Pertinent Problem List: Diagnoses of Chronic pain syndrome, Pharmacologic therapy, Disorder of skeletal system, Problems influencing health status, Chronic bilateral low back pain with bilateral sciatica ( (Secondary Area of Pain)) (R>L), Chronic pain of both lower extremities (Primary Area of Pain) (R>L), and Chronic pain of right knee (Tertiary Area of Pain) were pertinent to this visit.  Visit Diagnosis: 1. Chronic pain syndrome   2. Pharmacologic therapy   3. Disorder of skeletal system   4. Problems influencing health status   5. 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 Chronic pain of both lower extremities (Primary Area of Pain) (R>L)   7. Chronic pain of right knee (Tertiary Area of Pain)    Plan of Care  Initial treatment plan:  Please be advised that as per protocol, today's visit has been an evaluation only. We have not taken over the patient's controlled substance management.  Problem-specific plan: No problem-specific Assessment & Plan notes found for this encounter.  Ordered Lab-work, Procedure(s), Referral(s), & Consult(s): Orders Placed This Encounter  Procedures  . ToxASSURE Select 13 (MW), Urine  . Comp. Metabolic Panel (12)  . Magnesium  . Vitamin B12  . Sedimentation rate  . 25-Hydroxyvitamin D Lcms D2+D3   . C-reactive protein   Pharmacotherapy: Medications ordered:  No orders of the defined types were placed in this encounter.  Medications administered during this visit: Nickoles Gregori had no medications administered during this visit.   Pharmacotherapy under  consideration:  Opioid Analgesics: The patient was informed that there is no guarantee that he would be a candidate for opioid analgesics. The decision will be made following CDC guidelines. This decision will be based on the results of diagnostic studies, as well as Mr. Corpening risk profile.  Membrane stabilizer: To be determined at a later time Muscle relaxant: To be determined at a later time NSAID: To be determined at a later time Other analgesic(s): To be determined at a later time   Interventional therapies under consideration: Mr. Schueler was informed that there is no guarantee that he would be a candidate for interventional therapies. The decision will be based on the results of diagnostic studies, as well as Mr. Dolman risk profile.  Possible procedure(s): Diagnostic bilateral lumbar epidural steroid injection Diagnostic bilateral transforaminal lumbar epidural steroid injection Diagnostic bilateral lumbar facet nerve block Possible bilateral lumbar facet radiofrequency ablation Diagnostic right intra-articular knee injection Diagnostic right knee Hyalgan series   Provider-requested follow-up: Return for 2nd Visit, w/ Dr. Dossie Arbour.  Future Appointments  Date Time Provider [place]  08/22/2018 10:30 AM Milinda Pointer, MD [facility] None    Primary Care Physician: Theotis Burrow, MD Location: [facility] Note by:  Date: 08/01/2018; Time: 12:49 PM  Pain Score Disclaimer: We use the NRS-11 scale. This is a self-reported, subjective measurement of pain severity with only modest accuracy. It is used primarily to identify changes within a particular patient. It must be understood  that outpatient pain scales are significantly less accurate that those used for research, where they can be applied under ideal controlled circumstances with minimal exposure to variables. In reality, the score is likely to be a combination of pain intensity and pain affect, where pain affect describes the degree of emotional arousal or changes in action readiness caused by the sensory experience of pain. Factors such as social and work situation, setting, emotional state, anxiety levels, expectation, and prior pain experience may influence pain perception and show large inter-individual differences that may also be affected by time variables.  Patient instructions provided during this appointment: Patient Instructions   ____________________________________________________________________________________________  Appointment Policy Summary  It is our goal and responsibility to provide the medical community with assistance in the evaluation and management of patients with chronic pain. Unfortunately our resources are limited. Because we do not have an unlimited amount of time, or available appointments, we are required to closely monitor and manage their use. The following rules exist to maximize their use:  Patient's responsibilities: 1. Punctuality:  At what time should I arrive? You should be physically present in our office 30 minutes before your scheduled appointment. Your scheduled appointment is with your assigned healthcare provider. However, it takes 5-10 minutes to be "checked-in", and another 15 minutes for the nurses to do the admission. If you arrive to our office at the time you were given for your appointment, you will end up being at least 20-25 minutes late to your appointment with the provider. 2. Tardiness:  What happens if I arrive only a few minutes after my scheduled appointment time? You will need to reschedule your appointment. The cutoff is your appointment time. This is why it  is so important that you arrive at least 30 minutes before that appointment. If you have an appointment scheduled for 10:00 AM and you arrive at 10:01, you will be required to reschedule your appointment.  3. Plan ahead:  Always assume that you will encounter traffic on your way in.  Plan for it. If you are dependent on a driver, make sure they understand these rules and the need to arrive early. 4. Other appointments and responsibilities:  Avoid scheduling any other appointments before or after your pain clinic appointments.  5. Be prepared:  Write down everything that you need to discuss with your healthcare provider and give this information to the admitting nurse. Write down the medications that you will need refilled. Bring your pills and bottles (even the empty ones), to all of your appointments, except for those where a procedure is scheduled. 6. No children or pets:  Find someone to take care of them. It is not appropriate to bring them in. 7. Scheduling changes:  We request "advanced notification" of any changes or cancellations. 8. Advanced notification:  Defined as a time period of more than 24 hours prior to the originally scheduled appointment. This allows for the appointment to be offered to other patients. 9. Rescheduling:  When a visit is rescheduled, it will require the cancellation of the original appointment. For this reason they both fall within the category of "Cancellations".  10. Cancellations:  They require advanced notification. Any cancellation less than 24 hours before the  appointment will be recorded as a "No Show". 11. No Show:  Defined as an unkept appointment where the patient failed to notify or declare to the practice their intention or inability to keep the appointment.  Corrective process for repeat offenders:  1. Tardiness: Three (3) episodes of rescheduling due to late arrivals will be recorded as one (1) "No Show". 2. Cancellation or reschedule: Three (3)  cancellations or rescheduling will be recorded as one (1) "No Show". 3. "No Shows": Three (3) "No Shows" within a 12 month period will result in discharge from the practice. ____________________________________________________________________________________________   ______________________________________________________________________________________________  Specialty Pain Scale  Introduction:  There are significant differences in how pain is reported. The word pain usually refers to physical pain, but it is also a common synonym of suffering. The medical community uses a scale from 0 (zero) to 10 (ten) to report pain level. Zero (0) is described as "no pain", while ten (10) is described as "the worse pain you can imagine". The problem with this scale is that physical pain is reported along with suffering. Suffering refers to mental pain, or more often yet it refers to any unpleasant feeling, emotion or aversion associated with the perception of harm or threat of harm. It is the psychological component of pain.  Pain Specialists prefer to separate the two components. The pain scale used by this practice is the Verbal Numerical Rating Scale (VNRS-11). This scale is for the physical pain only. DO NOT INCLUDE how your pain psychologically affects you. This scale is for adults 55 years of age and older. It has 11 (eleven) levels. The 1st level is 0/10. This means: "right now, I have no pain". In the context of pain management, it also means: "right now, my physical pain is under control with the current therapy".  General Information:  The scale should reflect your current level of pain. Unless you are specifically asked for the level of your worst pain, or your average pain. If you are asked for one of these two, then it should be understood that it is over the past 24 hours.  Levels 1 (one) through 5 (five) are described below, and can be treated as an outpatient. Ambulatory pain management  facilities such as ours are more than adequate to treat these levels.  Levels 6 (six) through 10 (ten) are also described below, however, these must be treated as a hospitalized patient. While levels 6 (six) and 7 (seven) may be evaluated at an urgent care facility, levels 8 (eight) through 10 (ten) constitute medical emergencies and as such, they belong in a hospital's emergency department. When having these levels (as described below), do not come to our office. Our facility is not equipped to manage these levels. Go directly to an urgent care facility or an emergency department to be evaluated.  Definitions:  Activities of Daily Living (ADL): Activities of daily living (ADL or ADLs) is a term used in healthcare to refer to people's daily self-care activities. Health professionals often use a person's ability or inability to perform ADLs as a measurement of their functional status, particularly in regard to people post injury, with disabilities and the elderly. There are two ADL levels: Basic and Instrumental. Basic Activities of Daily Living (BADL  or BADLs) consist of self-care tasks that include: Bathing and showering; personal hygiene and grooming (including brushing/combing/styling hair); dressing; Toilet hygiene (getting to the toilet, cleaning oneself, and getting back up); eating and self-feeding (not including cooking or chewing and swallowing); functional mobility, often referred to as "transferring", as measured by the ability to walk, get in and out of bed, and get into and out of a chair; the broader definition (moving from one place to another while performing activities) is useful for people with different physical abilities who are still able to get around independently. Basic ADLs include the things many people do when they get up in the morning and get ready to go out of the house: get out of bed, go to the toilet, bathe, dress, groom, and eat. On the average, loss of function typically  follows a particular order. Hygiene is the first to go, followed by loss of toilet use and locomotion. The last to go is the ability to eat. When there is only one remaining area in which the person is independent, there is a 62.9% chance that it is eating and only a 3.5% chance that it is hygiene. Instrumental Activities of Daily Living (IADL or IADLs) are not necessary for fundamental functioning, but they let an individual live independently in a community. IADL consist of tasks that include: cleaning and maintaining the house; home establishment and maintenance; care of others (including selecting and supervising caregivers); care of pets; child rearing; managing money; managing financials (investments, etc.); meal preparation and cleanup; shopping for groceries and necessities; moving within the community; safety procedures and emergency responses; health management and maintenance (taking prescribed medications); and using the telephone or other form of communication.  Instructions:  Most patients tend to report their pain as a combination of two factors, their physical pain and their psychosocial pain. This last one is also known as "suffering" and it is reflection of how physical pain affects you socially and psychologically. From now on, report them separately.  From this point on, when asked to report your pain level, report only your physical pain. Use the following table for reference.  Pain Clinic Pain Levels (0-5/10)  Pain Level Score  Description  No Pain 0   Mild pain 1 Nagging, annoying, but does not interfere with basic activities of daily living (ADL). Patients are able to eat, bathe, get dressed, toileting (being able to get on and off the toilet and perform personal hygiene functions), transfer (move in and out of bed or a chair without assistance), and maintain continence (  able to control bladder and bowel functions). Blood pressure and heart rate are unaffected. A normal heart rate  for a healthy adult ranges from 60 to 100 bpm (beats per minute).   Mild to moderate pain 2 Noticeable and distracting. Impossible to hide from other people. More frequent flare-ups. Still possible to adapt and function close to normal. It can be very annoying and may have occasional stronger flare-ups. With discipline, patients may get used to it and adapt.   Moderate pain 3 Interferes significantly with activities of daily living (ADL). It becomes difficult to feed, bathe, get dressed, get on and off the toilet or to perform personal hygiene functions. Difficult to get in and out of bed or a chair without assistance. Very distracting. With effort, it can be ignored when deeply involved in activities.   Moderately severe pain 4 Impossible to ignore for more than a few minutes. With effort, patients may still be able to manage work or participate in some social activities. Very difficult to concentrate. Signs of autonomic nervous system discharge are evident: dilated pupils (mydriasis); mild sweating (diaphoresis); sleep interference. Heart rate becomes elevated (>115 bpm). Diastolic blood pressure (lower number) rises above 100 mmHg. Patients find relief in laying down and not moving.   Severe pain 5 Intense and extremely unpleasant. Associated with frowning face and frequent crying. Pain overwhelms the senses.  Ability to do any activity or maintain social relationships becomes significantly limited. Conversation becomes difficult. Pacing back and forth is common, as getting into a comfortable position is nearly impossible. Pain wakes you up from deep sleep. Physical signs will be obvious: pupillary dilation; increased sweating; goosebumps; brisk reflexes; cold, clammy hands and feet; nausea, vomiting or dry heaves; loss of appetite; significant sleep disturbance with inability to fall asleep or to remain asleep. When persistent, significant weight loss is observed due to the complete loss of appetite and  sleep deprivation.  Blood pressure and heart rate becomes significantly elevated. Caution: If elevated blood pressure triggers a pounding headache associated with blurred vision, then the patient should immediately seek attention at an urgent or emergency care unit, as these may be signs of an impending stroke.    Emergency Department Pain Levels (6-10/10)  Emergency Room Pain 6 Severely limiting. Requires emergency care and should not be seen or managed at an outpatient pain management facility. Communication becomes difficult and requires great effort. Assistance to reach the emergency department may be required. Facial flushing and profuse sweating along with potentially dangerous increases in heart rate and blood pressure will be evident.   Distressing pain 7 Self-care is very difficult. Assistance is required to transport, or use restroom. Assistance to reach the emergency department will be required. Tasks requiring coordination, such as bathing and getting dressed become very difficult.   Disabling pain 8 Self-care is no longer possible. At this level, pain is disabling. The individual is unable to do even the most "basic" activities such as walking, eating, bathing, dressing, transferring to a bed, or toileting. Fine motor skills are lost. It is difficult to think clearly.   Incapacitating pain 9 Pain becomes incapacitating. Thought processing is no longer possible. Difficult to remember your own name. Control of movement and coordination are lost.   The worst pain imaginable 10 At this level, most patients pass out from pain. When this level is reached, collapse of the autonomic nervous system occurs, leading to a sudden drop in blood pressure and heart rate. This in turn results in a temporary  and dramatic drop in blood flow to the brain, leading to a loss of consciousness. Fainting is one of the body's self defense mechanisms. Passing out puts the brain in a calmed state and causes it to shut  down for a while, in order to begin the healing process.    Summary: 1.   Refer to this scale when providing [place] with your pain level. 2.   Be accurate and careful when reporting your pain level. This will help with your care. 3.   Over-reporting your pain level will lead to loss of credibility. 4.   Even a level of 1/10 means that there is pain and will be treated at our facility. 5.   High, inaccurate reporting will be documented as "Symptom Exaggeration", leading to loss of credibility and suspicions of possible secondary gains such as obtaining more narcotics, or wanting to appear disabled, for fraudulent reasons. 6.   Only pain levels of 5 or below will be seen at our facility. 7.   Pain levels of 6 and above will be sent to the Emergency Department and the appointment cancelled.  ______________________________________________________________________________________________   BMI Assessment: Estimated body mass index is 36.26 kg/m as calculated from the following:   Height as of this encounter: _0  (1.803 m).   Weight as of this encounter: 260 lb (117.9 kg).  BMI interpretation table: BMI level Category Range association with higher incidence of chronic pain  <18 kg/m2 Underweight   18.5-24.9 kg/m2 Ideal body weight   25-29.9 kg/m2 Overweight Increased incidence by 20%  30-34.9 kg/m2 Obese (Class I) Increased incidence by 68%  35-39.9 kg/m2 Severe obesity (Class II) Increased incidence by 136%  >40 kg/m2 Extreme obesity (Class III) Increased incidence by 254%   BMI Readings from Last 4 Encounters:  08/01/18 36.26 kg/m  06/12/18 39.75 kg/m  09/03/17 39.05 kg/m  08/03/17 40.59 kg/m   Wt Readings from Last 4 Encounters:  08/01/18 260 lb (117.9 kg)  06/12/18 285 lb (129.3 kg)  09/03/17 280 lb (127 kg)  08/03/17 291 lb (132 kg)

## 2018-08-04 ENCOUNTER — Encounter: Payer: Self-pay | Admitting: Nurse Practitioner

## 2018-08-04 DIAGNOSIS — E559 Vitamin D deficiency, unspecified: Secondary | ICD-10-CM | POA: Insufficient documentation

## 2018-08-04 LAB — COMP. METABOLIC PANEL (12)
ALBUMIN: 4.4 g/dL (ref 3.8–4.8)
AST: 18 IU/L (ref 0–40)
Albumin/Globulin Ratio: 1.6 (ref 1.2–2.2)
Alkaline Phosphatase: 78 IU/L (ref 39–117)
BUN/Creatinine Ratio: 13 (ref 10–24)
BUN: 11 mg/dL (ref 8–27)
Bilirubin Total: 0.7 mg/dL (ref 0.0–1.2)
CALCIUM: 8.9 mg/dL (ref 8.6–10.2)
Chloride: 104 mmol/L (ref 96–106)
Creatinine, Ser: 0.88 mg/dL (ref 0.76–1.27)
GFR, EST AFRICAN AMERICAN: 103 mL/min/{1.73_m2} (ref >59)
GFR, EST NON AFRICAN AMERICAN: 90 mL/min/{1.73_m2} (ref >59)
GLOBULIN, TOTAL: 2.8 g/dL (ref 1.5–4.5)
GLUCOSE: 75 mg/dL (ref 65–99)
POTASSIUM: 4 mmol/L (ref 3.5–5.2)
SODIUM: 144 mmol/L (ref 134–144)
TOTAL PROTEIN: 7.2 g/dL (ref 6.0–8.5)

## 2018-08-04 LAB — MAGNESIUM: Magnesium: 1.8 mg/dL (ref 1.6–2.3)

## 2018-08-04 LAB — VITAMIN B12: Vitamin B-12: 250 pg/mL (ref 232–1245)

## 2018-08-04 LAB — 25-HYDROXY VITAMIN D LCMS D2+D3
25-Hydroxy, Vitamin D-2: <1 ng/mL
25-Hydroxy, Vitamin D: 28 ng/mL — ABNORMAL LOW

## 2018-08-04 LAB — SEDIMENTATION RATE: SED RATE: 14 mm/h (ref 0–30)

## 2018-08-04 LAB — 25-HYDROXYVITAMIN D LCMS D2+D3: 25-HYDROXY, VITAMIN D-3: 28 ng/mL

## 2018-08-04 LAB — C-REACTIVE PROTEIN: CRP: 2 mg/L (ref 0–10)

## 2018-08-05 LAB — TOXASSURE SELECT 13 (MW), URINE

## 2018-08-08 DIAGNOSIS — F1290 Cannabis use, unspecified, uncomplicated: Secondary | ICD-10-CM | POA: Insufficient documentation

## 2018-08-09 ENCOUNTER — Encounter: Payer: Self-pay | Admitting: Nurse Practitioner

## 2018-08-22 ENCOUNTER — Ambulatory Visit: Payer: Medicare Other | Attending: Pain Medicine | Admitting: Pain Medicine

## 2018-08-22 ENCOUNTER — Other Ambulatory Visit: Payer: Self-pay

## 2018-08-22 DIAGNOSIS — M47816 Spondylosis without myelopathy or radiculopathy, lumbar region: Secondary | ICD-10-CM | POA: Insufficient documentation

## 2018-08-22 DIAGNOSIS — M51379 Other intervertebral disc degeneration, lumbosacral region without mention of lumbar back pain or lower extremity pain: Secondary | ICD-10-CM | POA: Insufficient documentation

## 2018-08-22 DIAGNOSIS — M79604 Pain in right leg: Secondary | ICD-10-CM | POA: Diagnosis not present

## 2018-08-22 DIAGNOSIS — M170 Bilateral primary osteoarthritis of knee: Secondary | ICD-10-CM | POA: Insufficient documentation

## 2018-08-22 DIAGNOSIS — M5442 Lumbago with sciatica, left side: Secondary | ICD-10-CM

## 2018-08-22 DIAGNOSIS — G8929 Other chronic pain: Secondary | ICD-10-CM

## 2018-08-22 DIAGNOSIS — G894 Chronic pain syndrome: Secondary | ICD-10-CM

## 2018-08-22 DIAGNOSIS — F1290 Cannabis use, unspecified, uncomplicated: Secondary | ICD-10-CM

## 2018-08-22 DIAGNOSIS — G629 Polyneuropathy, unspecified: Secondary | ICD-10-CM

## 2018-08-22 DIAGNOSIS — M25561 Pain in right knee: Secondary | ICD-10-CM

## 2018-08-22 DIAGNOSIS — M1611 Unilateral primary osteoarthritis, right hip: Secondary | ICD-10-CM | POA: Insufficient documentation

## 2018-08-22 DIAGNOSIS — M79605 Pain in left leg: Secondary | ICD-10-CM

## 2018-08-22 DIAGNOSIS — M5441 Lumbago with sciatica, right side: Secondary | ICD-10-CM

## 2018-08-22 DIAGNOSIS — M792 Neuralgia and neuritis, unspecified: Secondary | ICD-10-CM

## 2018-08-22 DIAGNOSIS — Z789 Other specified health status: Secondary | ICD-10-CM

## 2018-08-22 DIAGNOSIS — M4310 Spondylolisthesis, site unspecified: Secondary | ICD-10-CM

## 2018-08-22 DIAGNOSIS — M7918 Myalgia, other site: Secondary | ICD-10-CM

## 2018-08-22 DIAGNOSIS — M5137 Other intervertebral disc degeneration, lumbosacral region: Secondary | ICD-10-CM

## 2018-08-22 DIAGNOSIS — Z79899 Other long term (current) drug therapy: Secondary | ICD-10-CM

## 2018-08-22 DIAGNOSIS — M961 Postlaminectomy syndrome, not elsewhere classified: Secondary | ICD-10-CM

## 2018-08-22 DIAGNOSIS — R208 Other disturbances of skin sensation: Secondary | ICD-10-CM | POA: Insufficient documentation

## 2018-08-22 DIAGNOSIS — M797 Fibromyalgia: Secondary | ICD-10-CM | POA: Insufficient documentation

## 2018-08-22 DIAGNOSIS — E559 Vitamin D deficiency, unspecified: Secondary | ICD-10-CM

## 2018-08-22 DIAGNOSIS — M899 Disorder of bone, unspecified: Secondary | ICD-10-CM

## 2018-08-22 MED ORDER — ERGOCALCIFEROL 1.25 MG (50000 UT) PO CAPS: 50000.0000 [IU] | ORAL_CAPSULE | ORAL | 0 refills | Status: AC

## 2018-08-22 MED ORDER — VITAMIN D3 125 MCG (5000 UT) PO CAPS: 1.0000 | ORAL_CAPSULE | Freq: Every day | ORAL | 5 refills | Status: AC

## 2018-08-22 MED ORDER — MAGNESIUM 500 MG PO CAPS: 500.0000 mg | ORAL_CAPSULE | Freq: Two times a day (BID) | ORAL | 5 refills | Status: AC

## 2018-08-22 MED ORDER — GABAPENTIN 100 MG PO CAPS: 100.0000 mg | ORAL_CAPSULE | Freq: Every day | ORAL | 2 refills | Status: DC

## 2018-08-22 MED ORDER — CELECOXIB 200 MG PO CAPS: 200.0000 mg | ORAL_CAPSULE | Freq: Two times a day (BID) | ORAL | 2 refills | Status: DC

## 2018-08-22 MED ORDER — CALCIUM CARBONATE 600 MG PO TABS: 600.0000 mg | ORAL_TABLET | Freq: Two times a day (BID) | ORAL | 0 refills | Status: DC

## 2018-08-22 MED ORDER — VITAMIN B-12 5000 MCG SL SUBL: 5000.0000 ug | SUBLINGUAL_TABLET | Freq: Every day | SUBLINGUAL | 0 refills | Status: DC

## 2018-08-22 MED ORDER — BACLOFEN 10 MG PO TABS: 10.0000 mg | ORAL_TABLET | Freq: Three times a day (TID) | ORAL | 2 refills | Status: DC

## 2018-08-22 NOTE — Progress Notes (Signed)
Patient's Name: Jack Blanchard  MRN: [id number]  Referring Provider: Theotis Burrow*  DOB: Apr 05, 1951  PCP: Theotis Burrow, MD  DOS: 08/22/2018  Note by: Gaspar Cola, MD  Service setting: Virtual Visit (Telephone)  Attending: Gaspar Cola, MD  Location: Telephone Encounter  Specialty: Interventional Pain Management  Patient type: Established   Pain Management Encounter Note - Virtual Visit via Telephone Telehealth (real-time audio visits between healthcare provider and patient).  Patient's Phone No.:  [phone] (home); There is no such number on file (mobile).; (Preferred) [phone]  Pre-screening note:  Our staff contacted Jack Blanchard and offered him an "in person", "face-to-face" appointment versus a telephone encounter. He indicated preferring the telephone encounter, at this time.   Primary Reason(s) for Virtual Visit: Encounter for evaluation before starting new chronic pain management plan of care (Level of risk: moderate) COVID-19*  Social distancing based on CDC ans AMA recommendations.   I contacted Jack Blanchard on 08/22/2018 at 7:19 AM by telephone and clearly identified myself as Gaspar Cola, MD. I verified that I was speaking with the correct person using two identifiers (Name and date of birth: [birth year]).  Advanced Informed Consent I sought verbal advanced consent from Jack Blanchard for telemedicine interactions and virtual visit. I informed Jack Blanchard of the security and privacy concerns, risks, and limitations associated with performing an evaluation and management service by telephone. I also informed Jack Blanchard of the availability of "in person" appointments and I informed him of the possibility of a patient responsible charge related to this service. Jack Blanchard expressed understanding and agreed to proceed.   Historic Elements   Jack Blanchard is a 68 y.o. year old, male patient evaluated today after his last encounter by our practice on 08/01/2018. [name]  has a past medical history of Asthma, Back pain, Fibromyalgia, and History of degenerative disc disease. He also  has a past surgical history that includes Leg Surgery; Back surgery; and Colonoscopy with propofol (N/A, 08/03/2017). Jack Blanchard has a current medication list which includes the following prescription(s): albuterol, baclofen, calcium carbonate, celecoxib, vitamin d3, cholecalciferol, vitamin b-12, duloxetine, ergocalciferol, gabapentin, magnesium, and tamsulosin. He  reports that he has been smoking. He has never used smokeless tobacco. He reports current alcohol use. He reports current drug use. Drug: Marijuana. Jack Blanchard is allergic to vancomycin.   HPI  He is being evaluated for review of studies ordered on initial visit and to consider treatment plan options. Today I went over the results of his tests. These were explained in "Layman's terms". During today's appointment I went over my diagnostic impression, as well as the proposed treatment plan.  According to the patient his primary area of pain is in his legs and feet (B) (R>L).  He denies any precipitating factors.  He does have numbness tingling and burning type pain with weakness. He admits that the right is greater than the left He says the pain goes down both sides and fronts of his legs into his ankles.  He does have swelling in his feet and ankles.  He has had physical therapy.  He denies previous nerve conduction study.  Second area pain is in his low back (B) (R>L).  He denies one side being worse than the other.  He feels the pain is equal.  He is s/p discectomy which was done in [facility] approximately 12 years ago and spinal fusion last approximately 10 years ago by Dr. Nyra Capes in [place].  He admits  that he did have 1 injection approximately 12 years ago, between 2nd & 3rd suergeries however this caused a spinal headache so he did not have any additional. Again he is in physical therapy.  He has had MRI 2019.  Third  area of pain is in his knee (B) (R>L).  He admits that he has been seen by orthopedist in the past and needs a knee replacement.  He has had steroid injections on the right side in the past which were not effective. Again he is currently in physical therapy. He has had recent images.  In considering the treatment plan options, Jack Blanchard was reminded that I no longer take patients for medication management only. I asked him to let me know if he had no intention of taking advantage of the interventional therapies, so that we could make arrangements to provide this space to someone interested. I also made it clear that undergoing interventional therapies for the purpose of getting pain medications is very inappropriate on the part of a patient, and it will not be tolerated in this practice. This type of behavior would suggest true addiction and therefore it requires referral to an addiction specialist.   I discussed the assessment and treatment plan with the patient. The patient was provided an opportunity to ask questions and all were answered. The patient agreed with the plan and demonstrated an understanding of the instructions.  Patient advised to call back or seek an in-person evaluation if the symptoms or condition worsens.  Controlled Substance Pharmacotherapy Assessment REMS (Risk Evaluation and Mitigation Strategy)  Analgesic: None Highest recorded MME/day: 25 mg/day MME/day: 0 mg/day   Monitoring: [place] PMP: PDMP reviewed during this encounter.       Not applicable at this point since we have not taken over the patient's medication management yet. List of other Serum/Urine Drug Screening Test(s):  No results found. List of all UDS test(s) done:  Lab Results  Component Value Date   SUMMARY FINAL 08/01/2018   Last UDS on record: Summary  Date Value Ref Range Status  08/01/2018 FINAL  Final    Comment:    ==================================================================== TOXASSURE  SELECT 13 (MW) ==================================================================== Test                             Result       Flag       Units Drug Present not Declared for Prescription Verification   Carboxy-THC                    301          UNEXPECTED ng/mg creat    Carboxy-THC is a metabolite of tetrahydrocannabinol  (THC).    Source of [facility] is most commonly illicit, but THC is also present    in a scheduled prescription medication. ==================================================================== Test                      Result    Flag   Units      Ref Range   Creatinine              147              mg/dL      >=20 ==================================================================== Declared Medications:  The flagging and interpretation on this report are based on the  following declared medications.  Unexpected results may arise from  inaccuracies in the declared medications.  **  Note: The testing scope of this panel does not include following  reported medications:  Albuterol  Baclofen  Celecoxib  Cholecalciferol  Duloxetine  Tamsulosin ==================================================================== For clinical consultation, please call [phone]. ====================================================================    UDS interpretation: Unexpected findings: Undeclared illicit substance detected Medication Assessment Form: Not applicable. No opioids. Treatment compliance: Not applicable Risk Assessment Profile: Aberrant behavior: use of illicit substances Comorbid factors increasing risk of overdose: See initial evaluation. No additional risks detected today Opioid risk tool (ORT):  Opioid Risk  08/01/2018  Alcohol 0  Illegal Drugs 0  Rx Drugs 0  Alcohol 0  Illegal Drugs 0 (Untrue, as demonstrated by + UDS)  Rx Drugs 0  Age between 16-45 years  0  History of Preadolescent Sexual Abuse 0  Psychological Disease 0  Depression 0  Opioid Risk Tool  Scoring 0  Opioid Risk Interpretation Low Risk    ORT Scoring interpretation table:  Score <3 = Low Risk for SUD  Score between 4-7 = Moderate Risk for SUD  Score >8 = High Risk for Opioid Abuse   Risk of substance use disorder (SUD): High  Risk Mitigation Strategies:  Patient opioid safety counseling: No controlled substances prescribed. Patient-Prescriber Agreement (PPA): No agreement signed.  Controlled substance notification to other providers: None required. No opioid therapy.  Pharmacologic Plan: Non-opioid analgesic therapy offered.             Meds   Current Outpatient Medications:  .  albuterol (PROVENTIL) 2 MG tablet, Take 2 mg by mouth 3 (three) times daily., Disp: , Rfl:  .  baclofen (LIORESAL) 10 MG tablet, Take 1 tablet (10 mg total) by mouth 3 (three) times daily., Disp: 90 tablet, Rfl: 2 .  calcium carbonate (CALCIUM 600) 600 MG TABS tablet, Take 1 tablet (600 mg total) by mouth 2 (two) times daily with a meal for 30 days., Disp: 60 tablet, Rfl: 0 .  celecoxib (CELEBREX) 200 MG capsule, Take 1 capsule (200 mg total) by mouth 2 (two) times daily., Disp: 60 capsule, Rfl: 2 .  Cholecalciferol (VITAMIN D3) 125 MCG (5000 UT) CAPS, Take 1 capsule (5,000 Units total) by mouth daily with breakfast. Take along with calcium and magnesium., Disp: 30 capsule, Rfl: 5 .  cholecalciferol (VITAMIN D3) 25 MCG (1000 UT) tablet, Take 1,000 Units by mouth daily., Disp: , Rfl:  .  Cyanocobalamin (VITAMIN B-12) 5000 MCG SUBL, Place 1 tablet (5,000 mcg total) under the tongue daily for 30 days., Disp: 30 each, Rfl: 0 .  DULoxetine (CYMBALTA) 60 MG capsule, Take 60 mg by mouth daily., Disp: , Rfl:  .  ergocalciferol (VITAMIN D2) 1.25 MG (50000 UT) capsule, Take 1 capsule (50,000 Units total) by mouth 2 (two) times a week. X 6 weeks., Disp: 12 capsule, Rfl: 0 .  gabapentin (NEURONTIN) 100 MG capsule, Take 1-3 capsules (100-300 mg total) by mouth at bedtime. Follow written titration schedule.,  Disp: 90 capsule, Rfl: 2 .  Magnesium 500 MG CAPS, Take 1 capsule (500 mg total) by mouth 2 (two) times daily at 8 am and 10 pm., Disp: 60 capsule, Rfl: 5 .  tamsulosin (FLOMAX) 0.4 MG CAPS capsule, Take 0.4 mg by mouth., Disp: , Rfl:   Laboratory Chemistry  Inflammation Markers (CRP: Acute Phase) (ESR: Chronic Phase) Lab Results  Component Value Date   CRP 2 08/01/2018   ESRSEDRATE 14 08/01/2018  Rheumatology Markers Lab Results  Component Value Date   LABURIC 6.2 09/03/2017                        Renal Function Markers Lab Results  Component Value Date   BUN 11 08/01/2018   CREATININE 0.88 08/01/2018   BCR 13 08/01/2018   GFRAA 103 08/01/2018   GFRNONAA 90 08/01/2018                             Hepatic Function Markers Lab Results  Component Value Date   AST 18 08/01/2018   ALBUMIN 4.4 08/01/2018   ALKPHOS 78 08/01/2018                        Electrolytes Lab Results  Component Value Date   NA 144 08/01/2018   K 4.0 08/01/2018   CL 104 08/01/2018   CALCIUM 8.9 08/01/2018   MG 1.8 08/01/2018                        Neuropathy Markers Lab Results  Component Value Date   VITAMINB12 250 08/01/2018                        CNS Tests No results found.  Bone Pathology Markers Lab Results  Component Value Date   25OHVITD1 28 (L) 08/01/2018   25OHVITD2 <1.0 08/01/2018   25OHVITD3 28 08/01/2018                         Coagulation Parameters Lab Results  Component Value Date   PLT 192 09/03/2017                        Cardiovascular Markers Lab Results  Component Value Date   HGB 14.5 09/03/2017   HCT 42.4 09/03/2017                         ID Markers No results found.  CA Markers No results found.  Endocrine Markers No results found.  Note: Lab results reviewed.  Recent Diagnostic Imaging Review  Lumbosacral Imaging: Lumbar MR wo contrast:  Results for orders placed during the hospital encounter of 09/24/17  MR  LUMBAR SPINE WO CONTRAST   Narrative CLINICAL DATA:  Low back pain and bilateral leg pain right worse than left. Patient was unable to tolerate complete imaging because of pain. All images suffer from motion degradation.  EXAM: MRI LUMBAR SPINE WITHOUT CONTRAST  TECHNIQUE: Multiplanar, multisequence MR imaging of the lumbar spine was performed. No intravenous contrast was administered.  COMPARISON:  Radiography 03/09/2016  FINDINGS: Segmentation: S1 has some transitional features. Same numbering terminology as used previously.  Alignment:  Within normal limits.  Vertebrae: Distant PLIF L5-S1. No fracture or other focal bone finding.  Conus medullaris and cauda equina: Conus extends to the L1-2 level. Conus and cauda equina appear normal.  Paraspinal and other soft tissues: Negative  Disc levels:  T12-L1: Normal.  L1-2: Bulging of the disc.  No likely compressive stenosis.  L2-3: Bulging of the disc. Mild facet hypertrophy. Mild canal stenosis.  L3-4: Bulging of the disc. Facet and ligamentous hypertrophy. Mild canal stenosis.  L4-5: Mild bulging of the disc. Facet and ligamentous hypertrophy. Mild lateral recess and foraminal stenosis left  worse than right.  L5-S1: Previous PLIF. Wide patency of the canal and foramina. No complicating feature seen.  IMPRESSION: Motion degraded and abbreviated exam because of patient pain.  Satisfactory appearance at the previous decompression and fusion level of L5-S1.  Disc bulges and facet hypertrophy at L1-2, L2-3 and L3-4. Findings at these levels could certainly be associated with back pain. Distinct neural compression is not demonstrated however. Some potential for stenotic symptoms.  L4-5 facet degeneration and mild bulging of the disc. Mild narrowing of the lateral recesses and foramina left more than right.   Electronically Signed   By: Nelson Chimes M.D.   On: 09/25/2017 07:50    Lumbar DG 2-3 views:   Results for orders placed during the hospital encounter of 03/09/16  DG Lumbar Spine 2-3 Views   Narrative CLINICAL DATA:  Back pain for 7 months, increased today  EXAM: LUMBAR SPINE - 2-3 VIEW  COMPARISON:  None.  FINDINGS: L5-S1 solid fusion.  Rod and pedicle screws are unremarkable.  Degenerative disc disease with diffuse narrowing, focally advanced at L2-3 and L3-4. Lumbar facet arthropathy with spurring greatest at L4-5, where there is slight anterolisthesis. No evidence of fracture or focal bone lesion. Right hip osteoarthritis with joint narrowing, spurring, and subchondral cysts.  IMPRESSION: 1. No acute finding. 2. Diffuse lumbar disc and facet degeneration as described. 3. L5-S1 solid bony fusion.   Electronically Signed   By: Monte Fantasia M.D.   On: 03/09/2016 13:58          Hip Imaging: Hip-L DG 2-3 views:  Results for orders placed during the hospital encounter of 09/03/17  DG Hip Unilat W or Wo Pelvis 2-3 Views Left   Narrative CLINICAL DATA:  Pt reports increasing left hip pain over the past three days. Pt says that his ankle has been swollen and his toes feel numb. Denies any injury, able to bear weight but states that he cannot do it for an extended period of time  EXAM: DG HIP (WITH OR WITHOUT PELVIS) 2-3V LEFT  COMPARISON:  03/09/2016  FINDINGS: There is no evidence of hip fracture or dislocation. There is no evidence of arthropathy or other focal bone abnormality. Lower lumbar fixation hardware noted.  IMPRESSION: Negative.   Electronically Signed   By: Lucrezia Europe M.D.   On: 09/03/2017 13:57    Knee Imaging: Knee-R DG 4 views:  Results for orders placed during the hospital encounter of 08/10/17  DG Knee Complete 4 Views Right   Narrative CLINICAL DATA:  Bilateral knee pain for many years, worse recently, no injury  EXAM: RIGHT KNEE - COMPLETE 4+ VIEW  COMPARISON:  None.  FINDINGS: There is tricompartmental degenerative  joint disease of the right knee with some spurring from each compartment appear however primary loss of joint space involves the patellofemoral compartment with almost complete loss of joint space and spurring. No acute fracture is seen and no joint effusion is noted. The patella is slightly laterally positioned.  IMPRESSION: 1. 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 2. No fracture or effusion. 3. The patella is laterally positioned.   Electronically Signed   By: Ivar Drape M.D.   On: 08/10/2017 17:25    Knee-L DG 4 views:  Results for orders placed during the hospital encounter of 08/10/17  DG Knee Complete 4 Views Left   Narrative CLINICAL DATA:  Bilateral knee pain for over 10 years, worse recently, no injury  EXAM: LEFT  KNEE - COMPLETE 4+ VIEW  COMPARISON:  None.  FINDINGS: There is tricompartmental degenerative joint disease of the left knee. There is spurring from each compartment, but the primary loss of joint space involves the patellofemoral compartment with almost complete loss of joint space and spurring. No fracture is seen and no joint effusion is noted. However there is a loose body in the posterior joint space.  IMPRESSION: 1. Tricompartmental degenerative joint disease of the left knee primarily involving the patellofemoral compartment. 2. No fracture or effusion. 3. Loose bodies in the joint space.   Electronically Signed   By: Ivar Drape M.D.   On: 08/10/2017 17:24    Complexity Note: Imaging results reviewed. Results shared with Mr. Pigeon, using Layman's terms.                         Assessment  The primary encounter diagnosis was Chronic pain syndrome. Diagnoses of Chronic lower extremity pain (Primary area of Pain) (Bilateral) (R>L), Chronic low back pain (Secondary Area of Pain) (Bilateral) (R>L) w/ sciatica (Bilateral), Chronic knee pain (Tertiary Area of Pain)  (Right), Osteoarthritis of knees (Bilateral), DDD (degenerative disc disease), lumbosacral, Lumbar facet arthropathy, Lumbar facet syndrome (Bilateral) (R>L), Failed back surgical syndrome, Osteoarthritis of facet joint of lumbar spine, Grade 1 Anterolisthesis L4/L5, Disorder of skeletal system, Pharmacologic therapy, Marijuana use, Problems influencing health status, Vitamin D insufficiency, Lumbar facet hypertrophy, Osteoarthritis of hip (Right), Tricompartment osteoarthritis of knees (Bilateral), 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  Plan of Care  I have changed Jayko Sabine's celecoxib and baclofen. I am also having him start on ergocalciferol, Vitamin D3, Magnesium, calcium carbonate, Vitamin B-12, and gabapentin. Additionally, I am having him maintain his DULoxetine, tamsulosin, cholecalciferol, and albuterol. Pharmacotherapy (Medications Ordered): Meds ordered this encounter  Medications  . ergocalciferol (VITAMIN D2) 1.25 MG (50000 UT) capsule    Sig: Take 1 capsule (50,000 Units total) by mouth 2 (two) times a week. X 6 weeks.    Dispense:  12 capsule    Refill:  0    Do not add this medication to the electronic "Automatic Refill" notification system. Patient may have prescription filled one day early if pharmacy is closed on scheduled refill date.  . Cholecalciferol (VITAMIN D3) 125 MCG (5000 UT) CAPS    Sig: Take 1 capsule (5,000 Units total) by mouth daily with breakfast. Take along with calcium and magnesium.    Dispense:  30 capsule    Refill:  5    May substitute with similar over-the-counter product.  . Magnesium 500 MG CAPS    Sig: Take 1 capsule (500 mg total) by mouth 2 (two) times daily at 8 am and 10 pm.    Dispense:  60 capsule    Refill:  5    May substitute with similar over-the-counter  product.  . calcium carbonate (CALCIUM 600) 600 MG TABS tablet    Sig: Take 1 tablet (600 mg total) by mouth 2 (two) times daily with a meal for 30 days.    Dispense:  60 tablet    Refill:  0    May substitute with similar over-the-counter product.  . Cyanocobalamin (VITAMIN B-12) 5000 MCG SUBL    Sig: Place 1 tablet (5,000 mcg total) under the tongue daily for 30 days.    Dispense:  30 each  Refill:  0    Do not place medication on "Automatic Refill". Fill one day early if pharmacy is closed on scheduled refill date.  . celecoxib (CELEBREX) 200 MG capsule    Sig: Take 1 capsule (200 mg total) by mouth 2 (two) times daily.    Dispense:  60 capsule    Refill:  2    Do not place medication on "Automatic Refill". Fill one day early if pharmacy is closed on scheduled refill date.  . baclofen (LIORESAL) 10 MG tablet    Sig: Take 1 tablet (10 mg total) by mouth 3 (three) times daily.    Dispense:  90 tablet    Refill:  2    Do not place medication on "Automatic Refill". Fill one day early if pharmacy is closed on scheduled refill date.  . gabapentin (NEURONTIN) 100 MG capsule    Sig: Take 1-3 capsules (100-300 mg total) by mouth at bedtime. Follow written titration schedule.    Dispense:  90 capsule    Refill:  2    Do not place medication on "Automatic Refill". Fill one day early if pharmacy is closed on scheduled refill date.    Procedure Orders     Caudal Epidural Injection Lab Orders  No laboratory test(s) ordered today   Imaging Orders  No imaging studies ordered today   Referral Orders  No referral(s) requested today   Orders:  Orders Placed This Encounter  Procedures  . Caudal Epidural Injection    Standing Status:   Future    Standing Expiration Date:   09/21/2018    Scheduling Instructions:     Laterality: Midline     Level(s): Sacrococcygeal canal (Tailbone area)     Sedation: Patient's choice     Scheduling Timeframe: As soon as pre-approved    Order Specific  Question:   Where will this procedure be performed?    Answer:   [facility]  . NCV with EMG(electromyography)    Bilateral testing requested.    Standing Status:   Future    Standing Expiration Date:   08/22/2019    Scheduling Instructions:     Please refer this patient to [facility] for Nerve Conduction testing of the lower extremities. (EMG & PNCV)    Order Specific Question:   Where should this test be performed?    Answer:   Other   Pharmacological management options:  Opioid Analgesics: I will not be prescribing any opioids at this time Membrane stabilizer: Options discussed, including a trial. Muscle relaxant: Currently on a muscle relaxant (baclofen) NSAID: Currently on a NSAID (Celebrex) Other analgesic(s): Currently on Cymbalta   Interventional management options: Planned, scheduled, and/or pending:    1.  Diagnostic midline caudal ESI #1 + diagnostic epidurogram under fluoroscopic guidance and IV sedation 2.  Referral to neurology for lower extremity nerve conduction test to evaluate for the peripheral neuropathy. 3.  The patient was started on vitamin D and vitamin B12, as well as calcium and OTC magnesium. 4.  Today I have taken over his NSAID prescription and muscle relaxant prescription.   Considering:   Diagnostic caudal ESI #1 + diagnostic epidurogram  Diagnostic LESI  Diagnostic bilateral transforaminal LESI  Diagnostic bilateral lumbar facet block  Possible bilateral lumbar facet RFA  Diagnostic right intra-articular knee injection  Diagnostic right knee Hyalgan series    PRN Procedures:   None at this time   Total duration of non-face-to-face encounter: 30 minutes.  Follow-up plan:  Return in about 1 month (around 09/21/2018) for Med-Mgmt, w/ Dr. Dossie Arbour, Procedure (w/ sedation): (ML) Caudal ESI #1 + Epidurogram.    No future appointments.  Primary Care Physician: Theotis Burrow, MD Location: Telephone Virtual Visit Note  by: Gaspar Cola, MD Date: 08/22/2018; Time: 12:17 PM  Disclaimer:  * Given the special circumstances of the COVID-19 pandemic, the federal government has announced that the Office for Civil Rights (OCR) will exercise its enforcement discretion and will not impose penalties on physicians using telehealth in the event of noncompliance with regulatory requirements under the [place] and Accountability Act (HIPAA) in connection with the good faith provision of telehealth during the ZXYOF-18 national public health emergency. ([place])

## 2018-08-22 NOTE — Patient Instructions (Signed)
____________________________________________________________________________________________  Preparing for Procedure with Sedation  Procedure appointments are limited to planned procedures: . No Prescription Refills. . No disability issues will be discussed. . No medication changes will be discussed.  Instructions: . Oral Intake: Do not eat or drink anything for at least 8 hours prior to your procedure. . Transportation: Public transportation is not allowed. Bring an adult driver. The driver must be physically present in our waiting room before any procedure can be started. . Physical Assistance: Bring an adult physically capable of assisting you, in the event you need help. This adult should keep you company at home for at least 6 hours after the procedure. . Blood Pressure Medicine: Take your blood pressure medicine with a sip of water the morning of the procedure. . Blood thinners: Notify our staff if you are taking any blood thinners. Depending on which one you take, there will be specific instructions on how and when to stop it. . Diabetics on insulin: Notify the staff so that you can be scheduled 1st case in the morning. If your diabetes requires high dose insulin, take only  of your normal insulin dose the morning of the procedure and notify the staff that you have done so. . Preventing infections: Shower with an antibacterial soap the morning of your procedure. . Build-up your immune system: Take 1000 mg of Vitamin C with every meal (3 times a day) the day prior to your procedure. . Antibiotics: Inform the staff if you have a condition or reason that requires you to take antibiotics before dental procedures. . Pregnancy: If you are pregnant, call and cancel the procedure. . Sickness: If you have a cold, fever, or any active infections, call and cancel the procedure. . Arrival: You must be in the facility at least 30 minutes prior to your scheduled procedure. . Children: Do not bring  children with you. . Dress appropriately: Bring dark clothing that you would not mind if they get stained. . Valuables: Do not bring any jewelry or valuables.  Reasons to call and reschedule or cancel your procedure: (Following these recommendations will minimize the risk of a serious complication.) . Surgeries: Avoid having procedures within 2 weeks of any surgery. (Avoid for 2 weeks before or after any surgery). . Flu Shots: Avoid having procedures within 2 weeks of a flu shots or . (Avoid for 2 weeks before or after immunizations). . Barium: Avoid having a procedure within 7-10 days after having had a radiological study involving the use of radiological contrast. (Myelograms, Barium swallow or enema study). . Heart attacks: Avoid any elective procedures or surgeries for the initial 6 months after a "Myocardial Infarction" (Heart Attack). . Blood thinners: It is imperative that you stop these medications before procedures. Let us know if you if you take any blood thinner.  . Infection: Avoid procedures during or within two weeks of an infection (including chest colds or gastrointestinal problems). Symptoms associated with infections include: Localized redness, fever, chills, night sweats or profuse sweating, burning sensation when voiding, cough, congestion, stuffiness, runny nose, sore throat, diarrhea, nausea, vomiting, cold or Flu symptoms, recent or current infections. It is specially important if the infection is over the area that we intend to treat. . Heart and lung problems: Symptoms that may suggest an active cardiopulmonary problem include: cough, chest pain, breathing difficulties or shortness of breath, dizziness, ankle swelling, uncontrolled high or unusually low blood pressure, and/or palpitations. If you are experiencing any of these symptoms, cancel your procedure and contact   your primary care physician for an evaluation.  Remember:  Regular Business hours are:  Monday to Thursday  8:00 AM to 4:00 PM  Provider's Schedule: Charie Pinkus, MD:  Procedure days: Tuesday and Thursday 7:30 AM to 4:00 PM  Bilal Lateef, MD:  Procedure days: Monday and Wednesday 7:30 AM to 4:00 PM ____________________________________________________________________________________________   ____________________________________________________________________________________________  General Risks and Possible Complications  Patient Responsibilities: It is important that you read this as it is part of your informed consent. It is our duty to inform you of the risks and possible complications associated with treatments offered to you. It is your responsibility as a patient to read this and to ask questions about anything that is not clear or that you believe was not covered in this document.  Patient's Rights: You have the right to refuse treatment. You also have the right to change your mind, even after initially having agreed to have the treatment done. However, under this last option, if you wait until the last second to change your mind, you may be charged for the materials used up to that point.  Introduction: Medicine is not an exact science. Everything in Medicine, including the lack of treatment(s), carries the potential for danger, harm, or loss (which is by definition: Risk). In Medicine, a complication is a secondary problem, condition, or disease that can aggravate an already existing one. All treatments carry the risk of possible complications. The fact that a side effects or complications occurs, does not imply that the treatment was conducted incorrectly. It must be clearly understood that these can happen even when everything is done following the highest safety standards.  No treatment: You can choose not to proceed with the proposed treatment alternative. The "PRO(s)" would include: avoiding the risk of complications associated with the therapy. The "CON(s)" would include: not  getting any of the treatment benefits. These benefits fall under one of three categories: diagnostic; therapeutic; and/or palliative. Diagnostic benefits include: getting information which can ultimately lead to improvement of the disease or symptom(s). Therapeutic benefits are those associated with the successful treatment of the disease. Finally, palliative benefits are those related to the decrease of the primary symptoms, without necessarily curing the condition (example: decreasing the pain from a flare-up of a chronic condition, such as incurable terminal cancer).  General Risks and Complications: These are associated to most interventional treatments. They can occur alone, or in combination. They fall under one of the following six (6) categories: no benefit or worsening of symptoms; bleeding; infection; nerve damage; allergic reactions; and/or death. 1. No benefits or worsening of symptoms: In Medicine there are no guarantees, only probabilities. No healthcare provider can ever guarantee that a medical treatment will work, they can only state the probability that it may. Furthermore, there is always the possibility that the condition may worsen, either directly, or indirectly, as a consequence of the treatment. 2. Bleeding: This is more common if the patient is taking a blood thinner, either prescription or over the counter (example: Goody Powders, Fish oil, Aspirin, Garlic, etc.), or if suffering a condition associated with impaired coagulation (example: Hemophilia, cirrhosis of the liver, low platelet counts, etc.). However, even if you do not have one on these, it can still happen. If you have any of these conditions, or take one of these drugs, make sure to notify your treating physician. 3. Infection: This is more common in patients with a compromised immune system, either due to disease (example: diabetes, cancer, human immunodeficiency virus [HIV],   etc.), or due to medications or treatments  (example: therapies used to treat cancer and rheumatological diseases). However, even if you do not have one on these, it can still happen. If you have any of these conditions, or take one of these drugs, make sure to notify your treating physician. 4. Nerve Damage: This is more common when the treatment is an invasive one, but it can also happen with the use of medications, such as those used in the treatment of cancer. The damage can occur to small secondary nerves, or to large primary ones, such as those in the spinal cord and brain. This damage may be temporary or permanent and it may lead to impairments that can range from temporary numbness to permanent paralysis and/or brain death. 5. Allergic Reactions: Any time a substance or material comes in contact with our body, there is the possibility of an allergic reaction. These can range from a mild skin rash (contact dermatitis) to a severe systemic reaction (anaphylactic reaction), which can result in death. 6. Death: In general, any medical intervention can result in death, most of the time due to an unforeseen complication. ____________________________________________________________________________________________  ____________________________________________________________________________________________  Muscle Spasms & Cramps  Cause:  The most common cause of muscle spasms and cramps is vitamin and/or electrolyte (calcium, potassium, sodium, etc.) deficiencies.  Possible triggers: Sweating - causes loss of electrolytes thru the skin. Steroids - causes loss of electrolytes thru the urine.  Treatment: 1. Gatorade (or any other electrolyte-replenishing drink) - Take 1, 8 oz glass with each meal (3 times a day). 2. OTC (over-the-counter) Magnesium 400 to 500 mg - Take 1 tablet twice a day (one with breakfast and one before bedtime). If you have kidney problems, talk to your primary care physician before taking any Magnesium. 3. Tonic Water  with quinine - Take 1, 8 oz glass before bedtime.   ____________________________________________________________________________________________   ____________________________________________________________________________________________  Initial Gabapentin Titration  Medication used: Gabapentin (Generic Name) or Neurontin (Brand Name) 100 mg tablets/capsules  Reasons to stop increasing the dose:  Reason 1: You get good relief of symptoms, in which case there is no need to increase the daily dose any further.    Reason 2: You develop some side effects, such as sleeping all of the time, difficulty concentrating, or becoming disoriented, in which case you need to go down on the dose, to the prior level, where you were not experiencing any side effects. Stay on that dose longer, to allow more time for your body to get use it, before attempting to increase it again.   Steps: Step 1: Start by taking 1 (one) tablet at bedtime x 7 (seven) days.  Step 2: After being on 1 (one) tablet for 7 (seven) days, then increase it to 2 (two) tablets at bedtime for another 7 (seven) days.  Step 3: Next, after being on 2 (two) tablets at bedtime for 7 (seven) days, then increase it to 3 (three) tablets at bedtime, and stay on that dose until you see your doctor.  Reasons to stop increasing the dose: Reason 1: You get good relief of symptoms, in which case there is no need to increase the daily dose any further.  Reason 2: You develop some side effects, such as sleeping all of the time, difficulty concentrating, or becoming disoriented, in which case you need to go down on the dose, to the prior level, where you were not experiencing any side effects. Stay on that dose longer, to allow more time for  your body to get use it, before attempting to increase it again.  Endpoint: Once you have reached the maximum dose you can tolerate without side-effects, contact your physician so as to evaluate the results of  the regimen.   Questions: Feel free to contact us for any questions or problems at [phone] ____________________________________________________________________________________________

## 2018-09-14 ENCOUNTER — Other Ambulatory Visit: Payer: Self-pay | Admitting: Pain Medicine

## 2018-09-14 DIAGNOSIS — M792 Neuralgia and neuritis, unspecified: Secondary | ICD-10-CM

## 2018-09-14 DIAGNOSIS — R208 Other disturbances of skin sensation: Secondary | ICD-10-CM

## 2018-10-11 ENCOUNTER — Telehealth: Payer: Self-pay | Admitting: *Deleted

## 2018-10-11 NOTE — Progress Notes (Signed)
Pain Management Virtual Encounter Note - Virtual Visit via Telephone Telehealth (real-time audio visits between healthcare provider and patient).  Patient's Phone No. & Preferred Pharmacy:  [phone] (home); There is no such number on file (mobile).; (Preferred) [phone] No e-mail address on record  [id number] - [place], [place] - [address] [place] [address] Phone: [phone] Fax: [phone]   Pre-screening note:  Our staff contacted Jack Blanchard and offered him an "in person", "face-to-face" appointment versus a telephone encounter. He indicated preferring the telephone encounter, at this time.  Reason for Virtual Visit: COVID-19*  Social distancing based on CDC and AMA recommendations.   I contacted Jack Blanchard on 10/12/2018 at 11:19 AM via telephone.      I clearly identified myself as Gaspar Cola, MD. I verified that I was speaking with the correct person using two identifiers (Name and date of birth: [birth year]).  Advanced Informed Consent I sought verbal advanced consent from Jack Blanchard for virtual visit interactions. I informed Jack Blanchard of possible security and privacy concerns, risks, and limitations associated with providing "not-in-person" medical evaluation and management services. I also informed Jack Blanchard of the availability of "in-person" appointments. Finally, I informed him that there would be a charge for the virtual visit and that he could be  personally, fully or partially, financially responsible for it. Jack Blanchard expressed understanding and agreed to proceed.   Historic Elements   Jack Blanchard is a 68 y.o. year old, male patient evaluated today after his last encounter by our practice on 09/14/2018. Jack Blanchard  has a past medical history of Asthma, Back pain, Fibromyalgia, and History of degenerative disc disease. He also  has a past surgical history that includes Leg Surgery; Back surgery; and Colonoscopy with propofol (N/A, 08/03/2017). [name] has a current medication list which includes the following prescription(s): albuterol, baclofen, calcium carbonate, celecoxib, vitamin d3, cholecalciferol, vitamin b-12, duloxetine, gabapentin, magnesium, prednisone, and tamsulosin. He  reports that he has been smoking. He has never used smokeless tobacco. He reports current alcohol use. He reports current drug use. Drug: Marijuana. Jack Blanchard is allergic to vancomycin.   HPI  I last communicated with him on 09/14/2018. Today, he is being contacted for medication management.  The patient indicates not having any problems with the gabapentin, baclofen, or the Celebrex.  However, he indicates that despite the fact that he is on the baclofen he is still experiencing muscle spasms during the day.  He indicates that in the past he used to take Valium 10 mg 3 times a day and that seemed to be the only thing that was controlling the spasms.  I am not interested in getting the patient on a benzodiazepine and therefore we will optimize the baclofen before we look for other alternatives.  He is currently taking only 10 mg 3 times a day and therefore we will go ahead and increase the baclofen to 20 mg 3 times daily.  If this is not enough, we can always go to 4 times daily.  He indicates that he still experiencing burning sensation in his feet despite the fact that he denies being a diabetic.  I explained to the patient that the Neurontin is meant to help with the burning sensation.  With regards to the gabapentin, despite the fact that a I instructed him to slowly increase it, he has stayed on that 100 mg at bedtime.  Today I told him to go up to  2 tablets at bedtime and after 7 to 14 days, if he is not experiencing any side effects or problems with it that he can go up to 3 tablets at bedtime.  We are just starting this medication and we can certainly go as high as 3600 mg/day if needed.  However, because of the patient's age, I do not think that he would be able to  tolerate the full 3600 mg.  However, because this is unpredictable, we will have to increase it slowly.  We will stop at whatever level provides him with improvement of his neuropathic pain.  Regarding the Celebrex, he indicates that this seems to help, but he still having swelling and pain.  Because of this, I offered him to do a steroid taper using prednisone.  He indicates not having any problems with the medication.  I have also explained to him to make sure not to take the Celebrex while he is on the prednisone.  To make sure that he understood all the instructions, I had him go get something to write with in some paper and I make sure that he will drop this down.  I asked him if he had access to "my chart" but he indicates that he does not and that he is not good with computers or smart phones.  As soon as the COVID-19 restrictions are lifted, we will bring him in for a diagnostic caudal epidural steroid injection and epidurogram.  For now, we will continue to adjust the medications, staying away from any opioid analgesics due to the fact that he tends to use illicit substances such as cannabinoids.  Pertinent Labs  Renal Function Lab Results  Component Value Date   BUN 11 08/01/2018   CREATININE 0.88 08/01/2018   BCR 13 08/01/2018   GFRAA 103 08/01/2018   GFRNONAA 90 08/01/2018  Hepatic Function Lab Results  Component Value Date   AST 18 08/01/2018   ALBUMIN 4.4 08/01/2018  Note: Above Lab results reviewed.  MR LUMBAR SPINE WO CONTRAST CLINICAL DATA:  Low back pain and bilateral leg pain right worse than left. Patient was unable to tolerate complete imaging because of pain. All images suffer from motion degradation.  EXAM: MRI LUMBAR SPINE WITHOUT CONTRAST  TECHNIQUE: Multiplanar, multisequence MR imaging of the lumbar spine was performed. No intravenous contrast was administered.  COMPARISON:  Radiography 03/09/2016  FINDINGS: Segmentation: S1 has some transitional features.  Same numbering terminology as used previously.  Alignment:  Within normal limits.  Vertebrae: Distant PLIF L5-S1. No fracture or other focal bone finding.  Conus medullaris and cauda equina: Conus extends to the L1-2 level. Conus and cauda equina appear normal.  Paraspinal and other soft tissues: Negative  Disc levels:  T12-L1: Normal.  L1-2: Bulging of the disc.  No likely compressive stenosis.  L2-3: Bulging of the disc. Mild facet hypertrophy. Mild canal stenosis.  L3-4: Bulging of the disc. Facet and ligamentous hypertrophy. Mild canal stenosis.  L4-5: Mild bulging of the disc. Facet and ligamentous hypertrophy. Mild lateral recess and foraminal stenosis left worse than right.  L5-S1: Previous PLIF. Wide patency of the canal and foramina. No complicating feature seen.  IMPRESSION: Motion degraded and abbreviated exam because of patient pain.  Satisfactory appearance at the previous decompression and fusion level of L5-S1.  Disc bulges and facet hypertrophy at L1-2, L2-3 and L3-4. Findings at these levels could certainly be associated with back pain. Distinct neural compression is not demonstrated however. Some potential for stenotic symptoms.  L4-5 facet degeneration and mild bulging of the disc. Mild narrowing of the lateral recesses and foramina left more than right.  Electronically Signed   By: Nelson Chimes M.D.   On: 09/25/2017 07:50   Office Visit on 08/01/2018  Component Date Value Ref Range Status  . Summary 08/01/2018 FINAL   Final   Comment: ==================================================================== TOXASSURE SELECT 13 (MW) ==================================================================== Test                             Result       Flag       Units Drug Present not Declared for Prescription Verification   Carboxy-THC                    301          UNEXPECTED ng/mg creat    Carboxy-THC is a metabolite of tetrahydrocannabinol   (THC).    Source of [facility] is most commonly illicit, but THC is also present    in a scheduled prescription medication. ==================================================================== Test                      Result    Flag   Units      Ref Range   Creatinine              147              mg/dL      >=20 ==================================================================== Declared Medications:  The flagging and interpretation on this report are based on the  following declared medications.  Unexpected results may arise from  inaccuracies in the declared medications.                            **Note: The testing scope of this panel does not include following  reported medications:  Albuterol  Baclofen  Celecoxib  Cholecalciferol  Duloxetine  Tamsulosin ==================================================================== For clinical consultation, please call [phone]. ====================================================================   . Glucose 08/01/2018 75  65 - 99 mg/dL Final  . BUN 08/01/2018 11  8 - 27 mg/dL Final  . Creatinine, Ser 08/01/2018 0.88  0.76 - 1.27 mg/dL Final  . GFR calc non Af Amer 08/01/2018 90  >59 mL/min/1.73 Final  . GFR calc Af Amer 08/01/2018 103  >59 mL/min/1.73 Final  . BUN/Creatinine Ratio 08/01/2018 13  10 - 24 Final  . Sodium 08/01/2018 144  134 - 144 mmol/L Final  . Potassium 08/01/2018 4.0  3.5 - 5.2 mmol/L Final  . Chloride 08/01/2018 104  96 - 106 mmol/L Final  . Calcium 08/01/2018 8.9  8.6 - 10.2 mg/dL Final  . Total Protein 08/01/2018 7.2  6.0 - 8.5 g/dL Final  . Albumin 08/01/2018 4.4  3.8 - 4.8 g/dL Final  . Globulin, Total 08/01/2018 2.8  1.5 - 4.5 g/dL Final  . Albumin/Globulin Ratio 08/01/2018 1.6  1.2 - 2.2 Final  . Bilirubin Total 08/01/2018 0.7  0.0 - 1.2 mg/dL Final  . Alkaline Phosphatase 08/01/2018 78  39 - 117 IU/L Final  . AST 08/01/2018 18  0 - 40 IU/L Final  . Magnesium 08/01/2018 1.8  1.6 - 2.3 mg/dL Final  .  Vitamin B-12 08/01/2018 250  232 - 1,245 pg/mL Final  . Sed Rate 08/01/2018 14  0 - 30 mm/hr Final  . 25-Hydroxy, Vitamin D 08/01/2018 28* ng/mL Final   Comment: Reference  Range: All Ages: Target levels 30 - 100   . 25-Hydroxy, Vitamin D-2 08/01/2018 <1.0  ng/mL Final  . 25-Hydroxy, Vitamin D-3 08/01/2018 28  ng/mL Final  . CRP 08/01/2018 2  0 - 10 mg/L Final   Assessment  The primary encounter diagnosis was Chronic pain syndrome. 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  Plan of Care  I have changed Rashad Blanchard's gabapentin, baclofen, and celecoxib. I am also having him start on predniSONE. Additionally, I am having him maintain his DULoxetine, tamsulosin, cholecalciferol, albuterol, Vitamin D3, Magnesium, calcium carbonate, and Vitamin B-12.  Pharmacotherapy (Medications Ordered): Meds ordered this encounter  Medications  . gabapentin (NEURONTIN) 100 MG capsule    Sig: Take 2 capsules (200 mg total) by mouth at bedtime for 7 days, THEN 3 capsules (300 mg total) at bedtime for 23 days. Follow written titration schedule.Marland Kitchen    Dispense:  83 capsule    Refill:  0    Do not place medication on "Automatic Refill". Fill one day early if pharmacy is closed on scheduled refill date.  . baclofen (LIORESAL) 10 MG tablet    Sig: Take 1-2 tablets (10-20 mg total) by mouth 3 (three) times daily for 30 days.    Dispense:  180 tablet    Refill:  0    Do not place medication on "Automatic Refill". Fill one  day early if pharmacy is closed on scheduled refill date.  . celecoxib (CELEBREX) 200 MG capsule    Sig: Take 1 capsule (200 mg total) by mouth 2 (two) times daily for 30 days.    Dispense:  60 capsule    Refill:  0    Fill one day early if pharmacy is closed on scheduled refill date. May substitute for generic if available. Hold while taking steroid taper.  . predniSONE (DELTASONE) 20 MG tablet    Sig: Take 3 tablets (60 mg total) by mouth daily with breakfast for 3 days, THEN 2 tablets (40 mg total) daily with breakfast for 3 days, THEN 1 tablet (20 mg total) daily with breakfast for 3 days.    Dispense:  18 tablet    Refill:  0   Orders:  No orders of the defined types were placed in this encounter.  Follow-up plan:   Return for (1 mo) Med-Mgmt, (Virtual Visit).    I discussed the assessment and treatment plan with the patient. The patient was provided an opportunity to ask questions and all were answered. The patient agreed with the plan and demonstrated an understanding of the instructions.  Patient advised to call back or seek an in-person evaluation if the symptoms or condition worsens.  Total duration of non-face-to-face encounter: 16 minutes.  Note by: Gaspar Cola, MD Date: 10/12/2018; Time: 11:44 AM  Note: This dictation was prepared with Dragon dictation. Any transcriptional errors that may result from this process are unintentional.  Disclaimer:  * Given the special circumstances of the COVID-19 pandemic, the federal government has announced that the Office for Civil Rights (OCR) will exercise its enforcement discretion and will not impose penalties on physicians using  telehealth in the event of noncompliance with regulatory requirements under the [employer] and Accountability Act (HIPAA) in connection with the good faith provision of telehealth during the QDIYM-41 national public health emergency. (AMA)

## 2018-10-11 NOTE — Telephone Encounter (Signed)
Voicemail left with patient  To please return the call so that we  May go over his medications prior to appt time.

## 2018-10-12 ENCOUNTER — Other Ambulatory Visit: Payer: Self-pay

## 2018-10-12 ENCOUNTER — Ambulatory Visit: Payer: Medicare Other | Attending: Pain Medicine | Admitting: Pain Medicine

## 2018-10-12 ENCOUNTER — Telehealth: Payer: Self-pay | Admitting: *Deleted

## 2018-10-12 DIAGNOSIS — R208 Other disturbances of skin sensation: Secondary | ICD-10-CM

## 2018-10-12 DIAGNOSIS — M25561 Pain in right knee: Secondary | ICD-10-CM

## 2018-10-12 DIAGNOSIS — M79605 Pain in left leg: Secondary | ICD-10-CM

## 2018-10-12 DIAGNOSIS — M170 Bilateral primary osteoarthritis of knee: Secondary | ICD-10-CM

## 2018-10-12 DIAGNOSIS — M792 Neuralgia and neuritis, unspecified: Secondary | ICD-10-CM

## 2018-10-12 DIAGNOSIS — G894 Chronic pain syndrome: Secondary | ICD-10-CM | POA: Diagnosis not present

## 2018-10-12 DIAGNOSIS — M47816 Spondylosis without myelopathy or radiculopathy, lumbar region: Secondary | ICD-10-CM

## 2018-10-12 DIAGNOSIS — M79604 Pain in right leg: Secondary | ICD-10-CM

## 2018-10-12 DIAGNOSIS — M7918 Myalgia, other site: Secondary | ICD-10-CM

## 2018-10-12 DIAGNOSIS — M5442 Lumbago with sciatica, left side: Secondary | ICD-10-CM

## 2018-10-12 DIAGNOSIS — M5441 Lumbago with sciatica, right side: Secondary | ICD-10-CM

## 2018-10-12 DIAGNOSIS — M4697 Unspecified inflammatory spondylopathy, lumbosacral region: Secondary | ICD-10-CM | POA: Insufficient documentation

## 2018-10-12 DIAGNOSIS — G8929 Other chronic pain: Secondary | ICD-10-CM

## 2018-10-12 MED ORDER — PREDNISONE 20 MG PO TABS: ORAL_TABLET | ORAL | 0 refills | Status: AC

## 2018-10-12 MED ORDER — GABAPENTIN 100 MG PO CAPS: ORAL_CAPSULE | ORAL | 0 refills | Status: DC

## 2018-10-12 MED ORDER — BACLOFEN 10 MG PO TABS: 10.0000 mg | ORAL_TABLET | Freq: Three times a day (TID) | ORAL | 0 refills | Status: DC

## 2018-10-12 MED ORDER — CELECOXIB 200 MG PO CAPS: 200.0000 mg | ORAL_CAPSULE | Freq: Two times a day (BID) | ORAL | 0 refills | Status: AC

## 2018-10-12 NOTE — Telephone Encounter (Signed)
Have tried to reach multiple times.

## 2018-10-13 ENCOUNTER — Other Ambulatory Visit: Payer: Self-pay | Admitting: Pain Medicine

## 2018-10-13 DIAGNOSIS — Z01818 Encounter for other preprocedural examination: Secondary | ICD-10-CM

## 2018-10-21 ENCOUNTER — Other Ambulatory Visit
Admission: RE | Admit: 2018-10-21 | Discharge: 2018-10-21 | Disposition: A | Discharge status: Home / Self Care | Payer: Medicare Other | Source: Ambulatory Visit | Attending: Pain Medicine | Admitting: Pain Medicine

## 2018-10-21 ENCOUNTER — Other Ambulatory Visit: Payer: Self-pay

## 2018-10-21 DIAGNOSIS — Z01812 Encounter for preprocedural laboratory examination: Secondary | ICD-10-CM | POA: Diagnosis present

## 2018-10-21 DIAGNOSIS — Z1159 Encounter for screening for other viral diseases: Secondary | ICD-10-CM | POA: Insufficient documentation

## 2018-10-22 LAB — NOVEL CORONAVIRUS, NAA (HOSP ORDER, SEND-OUT TO REF LAB; TAT 18-24 HRS): SARS-CoV-2, NAA: NOT DETECTED

## 2018-10-25 ENCOUNTER — Encounter: Payer: Self-pay | Admitting: Pain Medicine

## 2018-10-25 ENCOUNTER — Ambulatory Visit (HOSPITAL_BASED_OUTPATIENT_CLINIC_OR_DEPARTMENT_OTHER): Payer: Medicare Other | Admitting: Pain Medicine

## 2018-10-25 ENCOUNTER — Ambulatory Visit
Admission: RE | Admit: 2018-10-25 | Discharge: 2018-10-25 | Disposition: A | Discharge status: Home / Self Care | Payer: Medicare Other | Source: Ambulatory Visit | Attending: Pain Medicine | Admitting: Pain Medicine

## 2018-10-25 ENCOUNTER — Other Ambulatory Visit: Payer: Self-pay

## 2018-10-25 VITALS — BP 112/66 | HR 67 | Temp 98.2°F | Resp 23 | Ht 71.0 in | Wt 260.0 lb

## 2018-10-25 DIAGNOSIS — M79605 Pain in left leg: Secondary | ICD-10-CM | POA: Diagnosis present

## 2018-10-25 DIAGNOSIS — M4310 Spondylolisthesis, site unspecified: Secondary | ICD-10-CM | POA: Insufficient documentation

## 2018-10-25 DIAGNOSIS — M961 Postlaminectomy syndrome, not elsewhere classified: Secondary | ICD-10-CM | POA: Insufficient documentation

## 2018-10-25 DIAGNOSIS — G8929 Other chronic pain: Secondary | ICD-10-CM

## 2018-10-25 DIAGNOSIS — M5441 Lumbago with sciatica, right side: Secondary | ICD-10-CM | POA: Diagnosis present

## 2018-10-25 DIAGNOSIS — M79604 Pain in right leg: Secondary | ICD-10-CM | POA: Insufficient documentation

## 2018-10-25 DIAGNOSIS — M5137 Other intervertebral disc degeneration, lumbosacral region: Secondary | ICD-10-CM | POA: Diagnosis present

## 2018-10-25 DIAGNOSIS — M4697 Unspecified inflammatory spondylopathy, lumbosacral region: Secondary | ICD-10-CM

## 2018-10-25 DIAGNOSIS — M5442 Lumbago with sciatica, left side: Secondary | ICD-10-CM | POA: Insufficient documentation

## 2018-10-25 MED ORDER — LIDOCAINE HCL 2 % IJ SOLN
20.0000 mL | Freq: Once | INTRAMUSCULAR | Status: AC
  Administered 2018-10-25: 400 mg

## 2018-10-25 MED ORDER — ROPIVACAINE HCL 2 MG/ML IJ SOLN
INTRAMUSCULAR | Status: AC
  Filled 2018-10-25: qty 10

## 2018-10-25 MED ORDER — SODIUM CHLORIDE 0.9% FLUSH
2.0000 mL | Freq: Once | INTRAVENOUS | Status: AC
  Administered 2018-10-25: 2 mL

## 2018-10-25 MED ORDER — IOHEXOL 180 MG/ML  SOLN: 10.0000 mL | Freq: Once | INTRAMUSCULAR | Status: DC

## 2018-10-25 MED ORDER — TRIAMCINOLONE ACETONIDE 40 MG/ML IJ SUSP
INTRAMUSCULAR | Status: AC
  Filled 2018-10-25: qty 1

## 2018-10-25 MED ORDER — LACTATED RINGERS IV SOLN: 1000.0000 mL | Freq: Once | INTRAVENOUS | Status: DC

## 2018-10-25 MED ORDER — MIDAZOLAM HCL 5 MG/5ML IJ SOLN: 1.0000 mg | INTRAMUSCULAR | Status: DC | PRN

## 2018-10-25 MED ORDER — ROPIVACAINE HCL 2 MG/ML IJ SOLN
2.0000 mL | Freq: Once | INTRAMUSCULAR | Status: AC
  Administered 2018-10-25: 2 mL via EPIDURAL

## 2018-10-25 MED ORDER — SODIUM CHLORIDE (PF) 0.9 % IJ SOLN
INTRAMUSCULAR | Status: AC
  Filled 2018-10-25: qty 10

## 2018-10-25 MED ORDER — LIDOCAINE HCL 2 % IJ SOLN
INTRAMUSCULAR | Status: AC
  Filled 2018-10-25: qty 20

## 2018-10-25 MED ORDER — TRIAMCINOLONE ACETONIDE 40 MG/ML IJ SUSP
40.0000 mg | Freq: Once | INTRAMUSCULAR | Status: AC
  Administered 2018-10-25: 40 mg

## 2018-10-25 MED ORDER — FENTANYL CITRATE (PF) 100 MCG/2ML IJ SOLN: 25.0000 ug | INTRAMUSCULAR | Status: DC | PRN

## 2018-10-25 NOTE — Patient Instructions (Signed)

## 2018-10-25 NOTE — Progress Notes (Signed)
Safety precautions to be maintained throughout the outpatient stay will include: orient to surroundings, keep bed in low position, maintain call bell within reach at all times, provide assistance with transfer out of bed and ambulation.  

## 2018-10-25 NOTE — Progress Notes (Signed)
Patient's Name: Jack Blanchard  MRN: [id number]  Referring Provider: Theotis Burrow*  DOB: 09-29-1950  PCP: Theotis Burrow, MD  DOS: 10/25/2018  Note by: Gaspar Cola, MD  Service setting: Ambulatory outpatient  Specialty: Interventional Pain Management  Patient type: Established  Location: [facility] (AMB) Pain Management Facility  Visit type: Interventional Procedure   Primary Reason for Visit: Interventional Pain Management Treatment. CC: Back Pain (low)  Procedure:          Anesthesia, Analgesia, Anxiolysis:  Type: Diagnostic Epidural Steroid Injection + Diagnostic Epidurogram #1  Region: Caudal Level: Sacrococcygeal   Laterality: Midline aiming at the right  Type: Local Anesthesia Indication(s): Analgesia         Route: Infiltration ([place]/IM) IV Access: Declined Sedation: None since NPO instructions were not followed  Local Anesthetic: Lidocaine 1-2%  Position: Prone   Indications: 1. Failed back surgical syndrome   2. DDD (degenerative disc disease), lumbosacral   3. Grade 1 Anterolisthesis L4/L5   4. Inflammatory spondylopathy of lumbosacral region ([place])   5. Chronic lower extremity pain (Primary area of Pain) (Bilateral) (R>L)   6. 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 (year old), male patient, seen today for interventional treatment. He  has a past surgical history that includes Leg Surgery; Back surgery; and Colonoscopy with propofol (N/A, 08/03/2017). Jack Blanchard has a current medication list which includes the following prescription(s): albuterol, baclofen, calcium carbonate, celecoxib, vitamin d3, vitamin b-12, duloxetine, gabapentin, magnesium, tamsulosin, cholecalciferol, and prednisone, and the following Facility-Administered Medications: iohexol. His primarily concern today is the Back Pain (low)  Initial  Vital Signs:  Pulse/HCG Rate: 67ECG Heart Rate: 78 Temp: 98.2 F (36.8 C) Resp: 18 BP: 126/78 SpO2: 98 %  BMI: Estimated body mass index is 36.26 kg/m as calculated from the following:   Height as of this encounter: 5\' 11"  (1.803 m).   Weight as of this encounter: 260 lb (117.9 kg).  Risk Assessment: Allergies: Reviewed. He is allergic to vancomycin and acetaminophen.  Allergy Precautions: None required Coagulopathies: Reviewed. None identified.  Blood-thinner therapy: None at this time Active Infection(s): Reviewed. None identified. Jack Blanchard is afebrile  Site Confirmation: Jack Blanchard was asked to confirm the procedure and laterality before marking the site Procedure checklist: Completed Consent: Before the procedure and under the influence of no sedative(s), amnesic(s), or anxiolytics, the patient was informed of the treatment options, risks and possible complications. To fulfill our ethical and legal obligations, as recommended by the American Medical Association's Code of Ethics, I have informed the patient of my clinical impression; the nature and purpose of the treatment or procedure; the risks, benefits, and possible complications of the intervention; the alternatives, including doing nothing; the risk(s) and benefit(s) of the alternative treatment(s) or procedure(s); and the risk(s) and benefit(s) of doing nothing. The patient was provided information about the general risks and possible complications associated with the procedure. These may include, but are not limited to: failure to achieve desired goals, infection, bleeding, organ or nerve damage, allergic reactions, paralysis, and death. In addition, the patient was informed of those risks and complications associated to Spine-related procedures, such as failure to decrease pain; infection (i.e.: Meningitis, epidural or intraspinal abscess); bleeding (i.e.: epidural hematoma, subarachnoid hemorrhage, or any other type of intraspinal  or peri-dural bleeding); organ or nerve damage (i.e.: Any type of  peripheral nerve, nerve root, or spinal cord injury) with subsequent damage to sensory, motor, and/or autonomic systems, resulting in permanent pain, numbness, and/or weakness of one or several areas of the body; allergic reactions; (i.e.: anaphylactic reaction); and/or death. Furthermore, the patient was informed of those risks and complications associated with the medications. These include, but are not limited to: allergic reactions (i.e.: anaphylactic or anaphylactoid reaction(s)); adrenal axis suppression; blood sugar elevation that in diabetics may result in ketoacidosis or comma; water retention that in patients with history of congestive heart failure may result in shortness of breath, pulmonary edema, and decompensation with resultant heart failure; weight gain; swelling or edema; medication-induced neural toxicity; particulate matter embolism and blood vessel occlusion with resultant organ, and/or nervous system infarction; and/or aseptic necrosis of one or more joints. Finally, the patient was informed that Medicine is not an exact science; therefore, there is also the possibility of unforeseen or unpredictable risks and/or possible complications that may result in a catastrophic outcome. The patient indicated having understood very clearly. We have given the patient no guarantees and we have made no promises. Enough time was given to the patient to ask questions, all of which were answered to the patient's satisfaction. Jack Blanchard has indicated that he wanted to continue with the procedure. Attestation: I, the ordering provider, attest that I have discussed with the patient the benefits, risks, side-effects, alternatives, likelihood of achieving goals, and potential problems during recovery for the procedure that I have provided informed consent. Date  Time: 10/25/2018 12:57 PM  Pre-Procedure Preparation:  Monitoring: As per clinic  protocol. Respiration, ETCO2, SpO2, BP, heart rate and rhythm monitor placed and checked for adequate function Safety Precautions: Patient was assessed for positional comfort and pressure points before starting the procedure. Time-out: I initiated and conducted the "Time-out" before starting the procedure, as per protocol. The patient was asked to participate by confirming the accuracy of the "Time Out" information. Verification of the correct person, site, and procedure were performed and confirmed by me, the nursing staff, and the patient. "Time-out" conducted as per Joint Commission's Universal Protocol (UP.01.01.01). Time: 1321  Description of Procedure:          Target Area: Caudal Epidural Canal. Approach: Midline approach. Area Prepped: Entire Posterior Sacrococcygeal Region Prepping solution: DuraPrep (Iodine Povacrylex [0.7% available iodine] and Isopropyl Alcohol, 74% w/w) Safety Precautions: Aspiration looking for blood return was conducted prior to all injections. At no point did we inject any substances, as a needle was being advanced. No attempts were made at seeking any paresthesias. Safe injection practices and needle disposal techniques used. Medications properly checked for expiration dates. SDV (single dose vial) medications used. Description of the Procedure: Protocol guidelines were followed. The patient was placed in position over the fluoroscopy table. The target area was identified and the area prepped in the usual manner. Skin & deeper tissues infiltrated with local anesthetic. Appropriate amount of time allowed to pass for local anesthetics to take effect. The procedure needles were then advanced to the target area. Proper needle placement secured. Negative aspiration confirmed. Solution injected in intermittent fashion, asking for systemic symptoms every 0.5cc of injectate. The needles were then removed and the area cleansed, making sure to leave some of the prepping solution  back to take advantage of its long term bactericidal properties. Vitals:   10/25/18 1255 10/25/18 1320 10/25/18 1324 10/25/18 1329  BP: 126/78 133/72 122/70 112/66  Pulse: 67     Resp: 18 16 16  (!) 23  Temp: 98.2 F (36.8 C)     SpO2: 98% 97% 96% 95%  Weight: 260 lb (117.9 kg)     Height: 5\' 11"  (1.803 m)       Start Time: 1321 hrs. End Time: 1329 hrs. Materials:  Needle(s) Type: Epidural needle Gauge: 17G Length: 3.5-in Medication(s): Please see orders for medications and dosing details.  Imaging Guidance (Spinal):          Type of Imaging Technique: Fluoroscopy Guidance (Spinal) Indication(s): Assistance in needle guidance and placement for procedures requiring needle placement in or near specific anatomical locations not easily accessible without such assistance. Exposure Time: Please see nurses notes. Contrast: Before injecting any contrast, we confirmed that the patient did not have an allergy to iodine, shellfish, or radiological contrast. Once satisfactory needle placement was completed at the desired level, radiological contrast was injected. Contrast injected under live fluoroscopy. No contrast complications. See chart for type and volume of contrast used. Fluoroscopic Guidance: I was personally present during the use of fluoroscopy. "Tunnel Vision Technique" used to obtain the best possible view of the target area. Parallax error corrected before commencing the procedure. "Direction-depth-direction" technique used to introduce the needle under continuous pulsed fluoroscopy. Once target was reached, antero-posterior, oblique, and lateral fluoroscopic projection used confirm needle placement in all planes. Images permanently stored in EMR. Interpretation: I personally interpreted the imaging intraoperatively. Adequate needle placement confirmed in multiple planes. Appropriate spread of contrast into desired area was observed. No evidence of afferent or efferent intravascular uptake.  No intrathecal or subarachnoid spread observed. Permanent images saved into the patient's record.  Diagnostic Epidurogram:  Contrast: Before injecting any contrast, we confirmed that the patient did not have an allergy to iodine, shellfish, or radiological contrast. For accuracy purposes, contrast was injected under live fluoroscopy. Study personally interpreted intraoparatively. Type: Non-ionic, water soluble, hypoallergenic, myelogram-compatible, radiological contrast used. Please see orders and nurses note for specific choice of contrast. Volume: Please see nurses note for injected volume.  Observations:  Spinal Alignment: Adequate       Vertebral body: Intact Lamina: Intact Disc: Disc hight preserved Facet: Within Normal Limits.        Hardware: None  Spread: Appropriate epidural spread of contrast Anterior: Adequate Posterior: Adequate Superior (cephalad): Adequate Inferior (caudad): Adequate Right lateral: Adequate Left lateral: Adequate Plica medialis dorsalis: Medially aligned Nerve root(s): Adequate  Epidural extravasation: None observed Intrathecal: No intrathecal spread identified Subarachnoid: No subarachnoid spread pattern observed Vascular: No evidence of afferent or efferent intravascular uptake  Impression: Technically successful epidurogram. See above for details Note: Hard copies saved to EMR.  Antibiotic Prophylaxis:   Anti-infectives (From admission, onward)   None     Indication(s): None identified  Post-operative Assessment:  Post-procedure Vital Signs:  Pulse/HCG Rate: 6767 Temp: 98.2 F (36.8 C) Resp: (!) 23 BP: 112/66 SpO2: 95 %  EBL: None  Complications: No immediate post-treatment complications observed by team, or reported by patient.  Note: The patient tolerated the entire procedure well. A repeat set of vitals were taken after the procedure and the patient was kept under observation following institutional policy, for this type of  procedure. Post-procedural neurological assessment was performed, showing return to baseline, prior to discharge. The patient was provided with post-procedure discharge instructions, including a section on how to identify potential problems. Should any problems arise concerning this procedure, the patient was given instructions to immediately contact us, at any time, without hesitation. In any case, we plan to contact the patient by telephone  for a follow-up status report regarding this interventional procedure.  Comments:  No additional relevant information.  Plan of Care  Orders:  Orders Placed This Encounter  Procedures  . Caudal Epidural Injection    Scheduling Instructions:     Laterality: Midline     Level(s): Sacrococcygeal canal (Tailbone area)     Sedation: Patient's choice     Timeframe: Today    Order Specific Question:   Where will this procedure be performed?    Answer:   [facility]  . DG PAIN CLIN[facility] C-ARM 1-60 MIN NO REPORT    Intraoperative interpretation by procedural physician at [facility].    Standing Status:   Standing    Number of Occurrences:   1    Order Specific Question:   Reason for exam:    Answer:   Assistance in needle guidance and placement for procedures requiring needle placement in or near specific anatomical locations not easily accessible without such assistance.  . Provider attestation of informed consent for procedure/surgical case    I, the ordering provider, attest that I have discussed with the patient the benefits, risks, side effects, alternatives, likelihood of achieving goals and potential problems during recovery for the procedure that I have provided informed consent.    Standing Status:   Standing    Number of Occurrences:   1  . Informed Consent Details: Transcribe to consent form and obtain patient signature    Standing Status:   Standing    Number of Occurrences:   1    Order Specific Question:   Procedure     Answer:   Caudal epidural steroid injection under fluoroscopic guidance.    Order Specific Question:   Surgeon    Answer:   Kathlen Brunswick. Dossie Arbour, MD    Order Specific Question:   Indication/Reason    Answer:   Low back pain and/or lower extremity pain secondary to lumbosacral radiculitis/radiculopathy   Medications ordered for procedure: Meds ordered this encounter  Medications  . iohexol (OMNIPAQUE) 180 MG/ML injection 10 mL    Must be Myelogram-compatible. If not available, you may substitute with a water-soluble, non-ionic, hypoallergenic, myelogram-compatible radiological contrast medium.  Marland Kitchen lidocaine (XYLOCAINE) 2 % (with pres) injection 400 mg  . DISCONTD: lactated ringers infusion 1,000 mL  . DISCONTD: midazolam (VERSED) 5 MG/5ML injection 1-2 mg    Make sure Flumazenil is available in the pyxis when using this medication. If oversedation occurs, administer 0.2 mg IV over 15 sec. If after 45 sec no response, administer 0.2 mg again over 1 min; may repeat at 1 min intervals; not to exceed 4 doses (1 mg)  . DISCONTD: fentaNYL (SUBLIMAZE) injection 25-50 mcg    Make sure Narcan is available in the pyxis when using this medication. In the event of respiratory depression (RR< 8/min): Titrate NARCAN (naloxone) in increments of 0.1 to 0.2 mg IV at 2-3 minute intervals, until desired degree of reversal.  . sodium chloride flush (NS) 0.9 % injection 2 mL  . ropivacaine (PF) 2 mg/mL (0.2%) (NAROPIN) injection 2 mL  . triamcinolone acetonide (KENALOG-40) injection 40 mg   Medications administered: We administered lidocaine, sodium chloride flush, ropivacaine (PF) 2 mg/mL (0.2%), and triamcinolone acetonide.  See the medical record for exact dosing, route, and time of administration.  Disposition: Discharge home  Discharge Date & Time: 10/25/2018; 1335 hrs.   Follow-up plan:   Return in about 2 weeks (around 11/08/2018) for (Virtual), E/M, (Post-procedrure).  Future Appointments  Date  Time Provider [place]  11/07/2018  1:45 PM Milinda Pointer, MD [facility] None   Primary Care Physician: Theotis Burrow, MD Location: [facility] Note by: Gaspar Cola, MD Date: 10/25/2018; Time: 2:38 PM  Disclaimer:  Medicine is not an Chief Strategy Officer. The only guarantee in medicine is that nothing is guaranteed. It is important to note that the decision to proceed with this intervention was based on the information collected from the patient. The Data and conclusions were drawn from the patient's questionnaire, the interview, and the physical examination. Because the information was provided in large part by the patient, it cannot be guaranteed that it has not been purposely or unconsciously manipulated. Every effort has been made to obtain as much relevant data as possible for this evaluation. It is important to note that the conclusions that lead to this procedure are derived in large part from the available data. Always take into account that the treatment will also be dependent on availability of resources and existing treatment guidelines, considered by other Pain Management Practitioners as being common knowledge and practice, at the time of the intervention. For Medico-Legal purposes, it is also important to point out that variation in procedural techniques and pharmacological choices are the acceptable norm. The indications, contraindications, technique, and results of the above procedure should only be interpreted and judged by a Board-Certified Interventional Pain Specialist with extensive familiarity and expertise in the same exact procedure and technique.

## 2018-10-26 ENCOUNTER — Telehealth: Payer: Self-pay

## 2018-10-26 NOTE — Telephone Encounter (Signed)
Post procedure phone call.  Patient states he is doing good.  

## 2018-11-03 ENCOUNTER — Other Ambulatory Visit: Payer: Self-pay | Admitting: Pain Medicine

## 2018-11-03 ENCOUNTER — Telehealth: Payer: Self-pay | Admitting: *Deleted

## 2018-11-03 ENCOUNTER — Encounter: Payer: Self-pay | Admitting: Pain Medicine

## 2018-11-03 DIAGNOSIS — R208 Other disturbances of skin sensation: Secondary | ICD-10-CM

## 2018-11-03 DIAGNOSIS — G8929 Other chronic pain: Secondary | ICD-10-CM

## 2018-11-03 DIAGNOSIS — M792 Neuralgia and neuritis, unspecified: Secondary | ICD-10-CM

## 2018-11-03 NOTE — Telephone Encounter (Signed)
Attempted to call for pre appointment assessment. Message left, requesting patient return our call.

## 2018-11-07 ENCOUNTER — Ambulatory Visit: Payer: Medicare Other | Attending: Pain Medicine | Admitting: Pain Medicine

## 2018-11-07 ENCOUNTER — Other Ambulatory Visit: Payer: Self-pay

## 2018-11-07 DIAGNOSIS — M5441 Lumbago with sciatica, right side: Secondary | ICD-10-CM

## 2018-11-07 DIAGNOSIS — M25561 Pain in right knee: Secondary | ICD-10-CM | POA: Diagnosis not present

## 2018-11-07 DIAGNOSIS — G894 Chronic pain syndrome: Secondary | ICD-10-CM | POA: Diagnosis not present

## 2018-11-07 DIAGNOSIS — G8929 Other chronic pain: Secondary | ICD-10-CM

## 2018-11-07 DIAGNOSIS — M79604 Pain in right leg: Secondary | ICD-10-CM | POA: Diagnosis not present

## 2018-11-07 DIAGNOSIS — M47816 Spondylosis without myelopathy or radiculopathy, lumbar region: Secondary | ICD-10-CM

## 2018-11-07 DIAGNOSIS — M5442 Lumbago with sciatica, left side: Secondary | ICD-10-CM

## 2018-11-07 DIAGNOSIS — R208 Other disturbances of skin sensation: Secondary | ICD-10-CM

## 2018-11-07 DIAGNOSIS — M792 Neuralgia and neuritis, unspecified: Secondary | ICD-10-CM

## 2018-11-07 DIAGNOSIS — M79605 Pain in left leg: Secondary | ICD-10-CM

## 2018-11-07 MED ORDER — GABAPENTIN 300 MG PO CAPS: ORAL_CAPSULE | ORAL | 0 refills | Status: AC

## 2018-11-07 NOTE — Progress Notes (Signed)
Pain Management Virtual Encounter Note - Virtual Visit via Telephone Telehealth (real-time audio visits between healthcare provider and patient).   Patient's Phone No. & Preferred Pharmacy:  [phone] (home); There is no such number on file (mobile).; (Preferred) [phone] No e-mail address on record  [id number] - [place], [place] - [address] [address] Phone: [phone] Fax: [phone]    Pre-screening note:  Our staff contacted Jack Blanchard and offered him an "in person", "face-to-face" appointment versus a telephone encounter. He indicated preferring the telephone encounter, at this time.   Reason for Virtual Visit: COVID-19*  Social distancing based on CDC and AMA recommendations.   I contacted Jack Blanchard on 11/07/2018 via telephone.      I clearly identified myself as Jack Cola, MD. I verified that I was speaking with the correct person using two identifiers (Name: Jack Blanchard, and date of birth: [birth year]).  Advanced Informed Consent I sought verbal advanced consent from Jack Blanchard for virtual visit interactions. I informed Jack Blanchard of possible security and privacy concerns, risks, and limitations associated with providing "not-in-person" medical evaluation and management services. I also informed Jack Blanchard of the availability of "in-person" appointments. Finally, I informed him that there would be a charge for the virtual visit and that he could be  personally, fully or partially, financially responsible for it. Jack Blanchard expressed understanding and agreed to proceed.   Historic Elements   Jack Blanchard is a 68 y.o. year old, male patient evaluated today after his last encounter by our practice on 11/03/2018. Jack Blanchard  has a past medical history of Asthma, Back pain, Fibromyalgia, and History of degenerative disc disease. He also  has a past surgical history that includes Leg Surgery; Back surgery; and Colonoscopy with propofol (N/A, 08/03/2017).  Jack Blanchard has a current medication list which includes the following prescription(s): albuterol, baclofen, celecoxib, vitamin d3, cholecalciferol, duloxetine, gabapentin, magnesium, prednisone, tamsulosin, calcium carbonate, and vitamin b-12. He  reports that he has been smoking. He has never used smokeless tobacco. He reports current alcohol use. He reports current drug use. Drug: Marijuana. Mr. Diem is allergic to vancomycin and acetaminophen.   HPI  Today, he is being contacted for a post-procedure assessment.   Post-Procedure Evaluation  Procedure: Diagnostic caudal epidural steroid injection #1+ diagnostic epidurogram under fluoroscopic guidance and no sedation Pre-procedure pain level:  3/10 Post-procedure: 2/10 Limited initial benefit, possibly due to rapid discharge after no sedation procedure, without enough time to allow full onset of block.  Sedation: Please see nurses note.  Effectiveness during initial hour after procedure(Ultra-Short Term Relief): 70 %   Local anesthetic used: Long-acting (4-6 hours) Effectiveness: Defined as any analgesic benefit obtained secondary to the administration of local anesthetics. This carries significant diagnostic value as to the etiological location, or anatomical origin, of the pain. Duration of benefit is expected to coincide with the duration of the local anesthetic used.  Effectiveness during initial 4-6 hours after procedure(Short-Term Relief): 70 %   Long-term benefit: Defined as any relief past the pharmacologic duration of the local anesthetics.  Effectiveness past the initial 6 hours after procedure(Long-Term Relief): 70 %   Current benefits: Defined as benefit that persist at this time.   Analgesia:  >50% relief.  [name] indicates that his leg pain is 100% gone.  The only pain that he has remaining is the one in his lower back.  During his initial evaluation and on the last visit the  physical exam was suggestive of a significant lumbar  facet component to this pain.  Because of this, we have decided to schedule him to come in for a diagnostic lumbar facet block under fluoroscopic guidance and IV sedation.  The patient was informed of the plan and he was in complete agreement with it. Function: [name] reports improvement in function ROM: [name] reports improvement in ROM   Pertinent Labs   SAFETY SCREENING Profile Lab Results  Component Value Date   SARSCOV2NAA NOT DETECTED 10/21/2018   COVIDSOURCE NASOPHARYNGEAL 10/21/2018   Renal Function Lab Results  Component Value Date   BUN 11 08/01/2018   CREATININE 0.88 08/01/2018   BCR 13 08/01/2018   GFRAA 103 08/01/2018   GFRNONAA 90 08/01/2018   Hepatic Function Lab Results  Component Value Date   AST 18 08/01/2018   ALBUMIN 4.4 08/01/2018   UDS Summary  Date Value Ref Range Status  08/01/2018 FINAL  Final    Comment:    ==================================================================== TOXASSURE SELECT 13 (MW) ==================================================================== Test                             Result       Flag       Units Drug Present not Declared for Prescription Verification   Carboxy-THC                    301          UNEXPECTED ng/mg creat    Carboxy-THC is a metabolite of tetrahydrocannabinol  (THC).    Source of [facility] is most commonly illicit, but THC is also present    in a scheduled prescription medication. ==================================================================== Test                      Result    Flag   Units      Ref Range   Creatinine              147              mg/dL      >=20 ==================================================================== Declared Medications:  The flagging and interpretation on this report are based on the  following declared medications.  Unexpected results may arise from  inaccuracies in the declared medications.  **Note: The testing scope of this panel does not include  following  reported medications:  Albuterol  Baclofen  Celecoxib  Cholecalciferol  Duloxetine  Tamsulosin ==================================================================== For clinical consultation, please call [phone]. ====================================================================    Note: Above Lab results reviewed.  Recent imaging  MR LUMBAR SPINE WO CONTRAST CLINICAL DATA:  Low back pain and bilateral leg pain right worse than left. Patient was unable to tolerate complete imaging because of pain. All images suffer from motion degradation.  EXAM: MRI LUMBAR SPINE WITHOUT CONTRAST  TECHNIQUE: Multiplanar, multisequence MR imaging of the lumbar spine was performed. No intravenous contrast was administered.  COMPARISON:  Radiography 03/09/2016  FINDINGS: Segmentation: S1 has some transitional features. Same numbering terminology as used previously.  Alignment:  Within normal limits.  Vertebrae: Distant PLIF L5-S1. No fracture or other focal bone finding.  Conus medullaris and cauda equina: Conus extends to the L1-2 level. Conus and cauda equina appear normal.  Paraspinal and other soft tissues: Negative  Disc levels:  T12-L1: Normal.  L1-2: Bulging of the disc.  No likely compressive stenosis.  L2-3: Bulging of the disc. Mild facet hypertrophy.  Mild canal stenosis.  L3-4: Bulging of the disc. Facet and ligamentous hypertrophy. Mild canal stenosis.  L4-5: Mild bulging of the disc. Facet and ligamentous hypertrophy. Mild lateral recess and foraminal stenosis left worse than right.  L5-S1: Previous PLIF. Wide patency of the canal and foramina. No complicating feature seen.  IMPRESSION: Motion degraded and abbreviated exam because of patient pain.  Satisfactory appearance at the previous decompression and fusion level of L5-S1.  Disc bulges and facet hypertrophy at L1-2, L2-3 and L3-4. Findings at these levels could certainly be  associated with back pain. Distinct neural compression is not demonstrated however. Some potential for stenotic symptoms.  L4-5 facet degeneration and mild bulging of the disc. Mild narrowing of the lateral recesses and foramina left more than right.  Electronically Signed   By: Nelson Chimes M.D.   On: 09/25/2017 07:50  Assessment  The primary encounter diagnosis was Chronic pain syndrome. 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  Plan of Care  I have changed Welford Yohn's gabapentin. I am also having him maintain his DULoxetine, tamsulosin, cholecalciferol, albuterol, Vitamin D3, Magnesium, calcium carbonate, Vitamin B-12, baclofen, celecoxib, and predniSONE.  Pharmacotherapy (Medications Ordered): Meds ordered this encounter  Medications  . gabapentin (NEURONTIN) 300 MG capsule    Sig: Start by taking 1 cap (300 mg) PO HS. Increase by 1 cap every 7 days until taking 3 cap (900 mg) PO HS.    Dispense:  90 capsule    Refill:  0    Do not place medication on "Automatic Refill". Fill one day early if pharmacy is closed on scheduled refill date.   Orders:  Orders Placed This Encounter  Procedures  . LUMBAR FACET(MEDIAL BRANCH NERVE BLOCK) MBNB    Standing Status:   Future    Standing Expiration Date:   12/07/2018    Scheduling Instructions:     Side: Bilateral     Level: L3-4, L4-5, & L5-S1 Facets (L2, L3, L4, L5, & S1 Medial Branch Nerves)     Sedation: Patient's choice.     Timeframe: ASAA    Order Specific Question:   Where will this procedure be performed?    Answer:   [facility]   Follow-up plan:   Return for Procedure (w/  sedation): (B) L-FCT BLK #1 .    Recent Visits Date Type Provider Dept  10/25/18 Procedure visit Milinda Pointer, MD [facility]  10/12/18 Office Visit Milinda Pointer, MD [facility]  08/22/18 Office Visit Milinda Pointer, MD [facility]  Showing recent visits within past 90 days and meeting all other requirements   Today's Visits Date Type Provider Dept  11/07/18 Office Visit Milinda Pointer, MD [facility]  Showing today's visits and meeting all other requirements   Future Appointments No visits were found meeting these conditions.  Showing future appointments within next 90 days and meeting all other requirements   I discussed the assessment and treatment plan with the patient. The patient was provided an opportunity to ask questions and all were answered. The patient agreed with the plan and demonstrated an understanding of the instructions.  Patient advised to call back or seek an in-person evaluation if the symptoms or  condition worsens.  Total duration of non-face-to-face encounter: 15 minutes.  Note by: Jack Cola, MD Date: 11/07/2018; Time: 12:59 PM  Note: This dictation was prepared with Dragon dictation. Any transcriptional errors that may result from this process are unintentional.  Disclaimer:  * Given the special circumstances of the COVID-19 pandemic, the federal government has announced that the Office for Civil Rights (OCR) will exercise its enforcement discretion and will not impose penalties on physicians using telehealth in the event of noncompliance with regulatory requirements under the [place] (HIPAA) in connection with the good faith provision of telehealth during the GNFAO-13 national public health emergency. ([place])

## 2018-11-09 ENCOUNTER — Ambulatory Visit: Payer: Medicare Other | Admitting: Pain Medicine

## 2018-11-11 ENCOUNTER — Other Ambulatory Visit: Payer: Self-pay

## 2018-11-11 ENCOUNTER — Other Ambulatory Visit
Admission: RE | Admit: 2018-11-11 | Discharge: 2018-11-11 | Disposition: A | Discharge status: Home / Self Care | Payer: Medicare Other | Source: Ambulatory Visit | Attending: Pain Medicine | Admitting: Pain Medicine

## 2018-11-11 DIAGNOSIS — Z1159 Encounter for screening for other viral diseases: Secondary | ICD-10-CM | POA: Insufficient documentation

## 2018-11-12 LAB — NOVEL CORONAVIRUS, NAA (HOSP ORDER, SEND-OUT TO REF LAB; TAT 18-24 HRS): SARS-CoV-2, NAA: NOT DETECTED

## 2018-11-13 ENCOUNTER — Other Ambulatory Visit: Payer: Self-pay | Admitting: Pain Medicine

## 2018-11-13 DIAGNOSIS — M792 Neuralgia and neuritis, unspecified: Secondary | ICD-10-CM

## 2018-11-15 ENCOUNTER — Other Ambulatory Visit: Payer: Self-pay

## 2018-11-15 ENCOUNTER — Ambulatory Visit
Admission: RE | Admit: 2018-11-15 | Discharge: 2018-11-15 | Disposition: A | Discharge status: Home / Self Care | Payer: Medicare Other | Source: Ambulatory Visit | Attending: Pain Medicine | Admitting: Pain Medicine

## 2018-11-15 ENCOUNTER — Ambulatory Visit: Payer: Medicare Other | Admitting: Pain Medicine

## 2018-11-15 ENCOUNTER — Encounter: Payer: Self-pay | Admitting: Pain Medicine

## 2018-11-15 VITALS — BP 142/94 | HR 67 | Temp 98.0°F | Resp 18 | Ht 71.0 in | Wt 260.0 lb

## 2018-11-15 DIAGNOSIS — M4697 Unspecified inflammatory spondylopathy, lumbosacral region: Secondary | ICD-10-CM | POA: Insufficient documentation

## 2018-11-15 DIAGNOSIS — M4310 Spondylolisthesis, site unspecified: Secondary | ICD-10-CM | POA: Diagnosis present

## 2018-11-15 DIAGNOSIS — M5441 Lumbago with sciatica, right side: Secondary | ICD-10-CM | POA: Diagnosis present

## 2018-11-15 DIAGNOSIS — G8929 Other chronic pain: Secondary | ICD-10-CM

## 2018-11-15 DIAGNOSIS — M5137 Other intervertebral disc degeneration, lumbosacral region: Secondary | ICD-10-CM | POA: Diagnosis present

## 2018-11-15 DIAGNOSIS — M5442 Lumbago with sciatica, left side: Secondary | ICD-10-CM | POA: Diagnosis present

## 2018-11-15 DIAGNOSIS — M47816 Spondylosis without myelopathy or radiculopathy, lumbar region: Secondary | ICD-10-CM | POA: Diagnosis not present

## 2018-11-15 MED ORDER — LIDOCAINE HCL 2 % IJ SOLN
20.0000 mL | Freq: Once | INTRAMUSCULAR | Status: AC
  Administered 2018-11-15: 400 mg
  Filled 2018-11-15: qty 20

## 2018-11-15 MED ORDER — LACTATED RINGERS IV SOLN: 1000.0000 mL | Freq: Once | INTRAVENOUS | Status: DC

## 2018-11-15 MED ORDER — TRIAMCINOLONE ACETONIDE 40 MG/ML IJ SUSP
80.0000 mg | Freq: Once | INTRAMUSCULAR | Status: AC
  Administered 2018-11-15: 40 mg
  Filled 2018-11-15: qty 2

## 2018-11-15 MED ORDER — MIDAZOLAM HCL 5 MG/5ML IJ SOLN: 1.0000 mg | INTRAMUSCULAR | Status: DC | PRN

## 2018-11-15 MED ORDER — ROPIVACAINE HCL 2 MG/ML IJ SOLN
18.0000 mL | Freq: Once | INTRAMUSCULAR | Status: AC
  Administered 2018-11-15: 10 mL via PERINEURAL
  Filled 2018-11-15: qty 20

## 2018-11-15 MED ORDER — FENTANYL CITRATE (PF) 100 MCG/2ML IJ SOLN: 25.0000 ug | INTRAMUSCULAR | Status: DC | PRN

## 2018-11-15 NOTE — Patient Instructions (Signed)

## 2018-11-15 NOTE — Progress Notes (Signed)
Patient's Name: Jack Blanchard  MRN: [id number]  Referring Provider: Theotis Blanchard*  DOB: 02/13/1951  PCP: Jack Burrow, MD  DOS: 11/15/2018  Note by: Jack Cola, MD  Service setting: Ambulatory outpatient  Specialty: Interventional Pain Management  Patient type: Established  Location: [facility] (AMB) Pain Management Facility  Visit type: Interventional Procedure   Primary Reason for Visit: Interventional Pain Management Treatment. CC: Back Pain (low)  Procedure:          Anesthesia, Analgesia, Anxiolysis:  Type: Lumbar Facet, Medial Branch Block(s) #1  Primary Purpose: Diagnostic Region: Posterolateral Lumbosacral Spine Level: L2, L3, L4, L5, & S1 Medial Branch Level(s). Injecting these levels blocks the L3-4, L4-5, and L5-S1 lumbar facet joints. Laterality: Bilateral  Type: Local Anesthesia Indication(s): Analgesia         Route: Infiltration ([place]/IM) IV Access: Declined Sedation: Declined  Local Anesthetic: Lidocaine 1-2%  Position: Prone   Indications: 1. Lumbar facet syndrome (Bilateral) (R>L)   2. Lumbar facet hypertrophy   3. DDD (degenerative disc disease), lumbosacral   4. 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 Grade 1 Anterolisthesis L4/L5   6. Inflammatory spondylopathy of lumbosacral region (Jack Blanchard)   7. Osteoarthritis of facet joint of lumbar spine    Pain Score: Pre-procedure: 7 /10 Post-procedure: 7 /10  On 10/25/2018 the patient underwent a caudal epidural steroid injection which improved his pain by 70% however, a breakdown of the symptoms indicates that he attained 100% relief of the lower extremity pain with no recurrence of this pain at this point. Currently his primary and only pain is that of the lower back, suggesting the pathology for this pain to be outside of the spinal canal.  On initial evaluation the patient presented with pain on hyperextension and rotation with significant clinical  evidence of facet joint disease.  Today he comes in for his first diagnostic lumbar facet block.  Should he get good relief of the pain with this diagnostic injection, we will proceed with a second confirmatory block and then look at the possibility of radiofrequency ablation should he continue not to get long-term benefit from the diagnostic injections.  Pre-op Assessment:  Jack Blanchard is a 68 y.o. (year old), male patient, seen today for interventional treatment. He  has a past surgical history that includes Leg Surgery; Back surgery; and Colonoscopy with propofol (N/A, 08/03/2017). Jack Blanchard has a current medication list which includes the following prescription(s): albuterol, vitamin d3, cholecalciferol, duloxetine, flomax, gabapentin, magnesium, tamsulosin, baclofen, calcium carbonate, celecoxib, vitamin b-12, and prednisone. His primarily concern today is the Back Pain (low)  Initial Vital Signs:  Pulse/HCG Rate: 67ECG Heart Rate: 68 Temp: 98 F (36.7 C) Resp: 16 BP: 130/77 SpO2: 99 %  BMI: Estimated body mass index is 36.26 kg/m as calculated from the following:   Height as of this encounter: 5\' 11"  (1.803 m).   Weight as of this encounter: 260 lb (117.9 kg).  Risk Assessment: Allergies: Reviewed. He is allergic to vancomycin and acetaminophen.  Allergy Precautions: None required Coagulopathies: Reviewed. None identified.  Blood-thinner therapy: None at this time Active Infection(s): Reviewed. None identified. Jack Blanchard is afebrile  Site Confirmation: Jack Blanchard was asked to confirm the procedure and laterality before marking the site Procedure checklist: Completed Consent: Before the procedure and under the influence of no sedative(s), amnesic(s), or anxiolytics, the patient was informed of the treatment options, risks and possible complications. To fulfill our ethical and legal  obligations, as recommended by the American Medical Association's Code of Ethics, I have informed the patient of  my clinical impression; the nature and purpose of the treatment or procedure; the risks, benefits, and possible complications of the intervention; the alternatives, including doing nothing; the risk(s) and benefit(s) of the alternative treatment(s) or procedure(s); and the risk(s) and benefit(s) of doing nothing. The patient was provided information about the general risks and possible complications associated with the procedure. These may include, but are not limited to: failure to achieve desired goals, infection, bleeding, organ or nerve damage, allergic reactions, paralysis, and death. In addition, the patient was informed of those risks and complications associated to Spine-related procedures, such as failure to decrease pain; infection (i.e.: Meningitis, epidural or intraspinal abscess); bleeding (i.e.: epidural hematoma, subarachnoid hemorrhage, or any other type of intraspinal or peri-dural bleeding); organ or nerve damage (i.e.: Any type of peripheral nerve, nerve root, or spinal cord injury) with subsequent damage to sensory, motor, and/or autonomic systems, resulting in permanent pain, numbness, and/or weakness of one or several areas of the body; allergic reactions; (i.e.: anaphylactic reaction); and/or death. Furthermore, the patient was informed of those risks and complications associated with the medications. These include, but are not limited to: allergic reactions (i.e.: anaphylactic or anaphylactoid reaction(s)); adrenal axis suppression; blood sugar elevation that in diabetics may result in ketoacidosis or comma; water retention that in patients with history of congestive heart failure may result in shortness of breath, pulmonary edema, and decompensation with resultant heart failure; weight gain; swelling or edema; medication-induced neural toxicity; particulate matter embolism and blood vessel occlusion with resultant organ, and/or nervous system infarction; and/or aseptic necrosis of one or  more joints. Finally, the patient was informed that Medicine is not an exact science; therefore, there is also the possibility of unforeseen or unpredictable risks and/or possible complications that may result in a catastrophic outcome. The patient indicated having understood very clearly. We have given the patient no guarantees and we have made no promises. Enough time was given to the patient to ask questions, all of which were answered to the patient's satisfaction. Mr. Fray has indicated that he wanted to continue with the procedure. Attestation: I, the ordering provider, attest that I have discussed with the patient the benefits, risks, side-effects, alternatives, likelihood of achieving goals, and potential problems during recovery for the procedure that I have provided informed consent. Date   Time: 11/15/2018 12:47 PM  Pre-Procedure Preparation:  Monitoring: As per clinic protocol. Respiration, ETCO2, SpO2, BP, heart rate and rhythm monitor placed and checked for adequate function Safety Precautions: Patient was assessed for positional comfort and pressure points before starting the procedure. Time-out: I initiated and conducted the "Time-out" before starting the procedure, as per protocol. The patient was asked to participate by confirming the accuracy of the "Time Out" information. Verification of the correct person, site, and procedure were performed and confirmed by me, the nursing staff, and the patient. "Time-out" conducted as per Joint Commission's Universal Protocol (UP.01.01.01). Time: 1337  Description of Procedure:          Laterality: Bilateral. The procedure was performed in identical fashion on both sides. Levels:  L2, L3, L4, L5, & S1 Medial Branch Level(s) Area Prepped: Posterior Lumbosacral Region Prepping solution: DuraPrep (Iodine Povacrylex [0.7% available iodine] and Isopropyl Alcohol, 74% w/w) Safety Precautions: Aspiration looking for blood return was conducted prior to  all injections. At no point did we inject any substances, as a needle was being advanced. Before  injecting, the patient was told to immediately notify me if he was experiencing any new onset of "ringing in the ears, or metallic taste in the mouth". No attempts were made at seeking any paresthesias. Safe injection practices and needle disposal techniques used. Medications properly checked for expiration dates. SDV (single dose vial) medications used. After the completion of the procedure, all disposable equipment used was discarded in the proper designated medical waste containers. Local Anesthesia: Protocol guidelines were followed. The patient was positioned over the fluoroscopy table. The area was prepped in the usual manner. The time-out was completed. The target area was identified using fluoroscopy. A 12-in long, straight, sterile hemostat was used with fluoroscopic guidance to locate the targets for each level blocked. Once located, the skin was marked with an approved surgical skin marker. Once all sites were marked, the skin (epidermis, dermis, and hypodermis), as well as deeper tissues (fat, connective tissue and muscle) were infiltrated with a small amount of a short-acting local anesthetic, loaded on a 10cc syringe with a 25G, 1.5-in  Needle. An appropriate amount of time was allowed for local anesthetics to take effect before proceeding to the next step. Local Anesthetic: Lidocaine 2.0% The unused portion of the local anesthetic was discarded in the proper designated containers. Technical explanation of process:  L2 Medial Branch Nerve Block (MBB): The target area for the L2 medial branch is at the junction of the postero-lateral aspect of the superior articular process and the superior, posterior, and medial edge of the transverse process of L3. Under fluoroscopic guidance, a Quincke needle was inserted until contact was made with os over the superior postero-lateral aspect of the pedicular shadow  (target area). After negative aspiration for blood, 0.5 mL of the nerve block solution was injected without difficulty or complication. The needle was removed intact. L3 Medial Branch Nerve Block (MBB): The target area for the L3 medial branch is at the junction of the postero-lateral aspect of the superior articular process and the superior, posterior, and medial edge of the transverse process of L4. Under fluoroscopic guidance, a Quincke needle was inserted until contact was made with os over the superior postero-lateral aspect of the pedicular shadow (target area). After negative aspiration for blood, 0.5 mL of the nerve block solution was injected without difficulty or complication. The needle was removed intact. L4 Medial Branch Nerve Block (MBB): The target area for the L4 medial branch is at the junction of the postero-lateral aspect of the superior articular process and the superior, posterior, and medial edge of the transverse process of L5. Under fluoroscopic guidance, a Quincke needle was inserted until contact was made with os over the superior postero-lateral aspect of the pedicular shadow (target area). After negative aspiration for blood, 0.5 mL of the nerve block solution was injected without difficulty or complication. The needle was removed intact. L5 Medial Branch Nerve Block (MBB): The target area for the L5 medial branch is at the junction of the postero-lateral aspect of the superior articular process and the superior, posterior, and medial edge of the sacral ala. Under fluoroscopic guidance, a Quincke needle was inserted until contact was made with os over the superior postero-lateral aspect of the pedicular shadow (target area). After negative aspiration for blood, 0.5 mL of the nerve block solution was injected without difficulty or complication. The needle was removed intact. S1 Medial Branch Nerve Block (MBB): The target area for the S1 medial branch is at the posterior and inferior 6  o'clock position of the  L5-S1 facet joint. Under fluoroscopic guidance, the Quincke needle inserted for the L5 MBB was redirected until contact was made with os over the inferior and postero aspect of the sacrum, at the 6 o' clock position under the L5-S1 facet joint (Target area). After negative aspiration for blood, 0.5 mL of the nerve block solution was injected without difficulty or complication. The needle was removed intact.  Nerve block solution: 0.2% PF-Ropivacaine + Triamcinolone (40 mg/mL) diluted to a final concentration of 4 mg of Triamcinolone/mL of Ropivacaine The unused portion of the solution was discarded in the proper designated containers. Procedural Needles: 22-gauge, 3.5-inch, Quincke needles used for all levels.  Once the entire procedure was completed, the treated area was cleaned, making sure to leave some of the prepping solution back to take advantage of its long term bactericidal properties.   Illustration of the posterior view of the lumbar spine and the posterior neural structures. Laminae of L2 through S1 are labeled. DPRL5, dorsal primary ramus of L5; DPRS1, dorsal primary ramus of S1; DPR3, dorsal primary ramus of L3; FJ, facet (zygapophyseal) joint L3-L4; I, inferior articular process of L4; LB1, lateral branch of dorsal primary ramus of L1; IAB, inferior articular branches from L3 medial branch (supplies L4-L5 facet joint); IBP, intermediate branch plexus; MB3, medial branch of dorsal primary ramus of L3; NR3, third lumbar nerve root; S, superior articular process of L5; SAB, superior articular branches from L4 (supplies L4-5 facet joint also); TP3, transverse process of L3.  Vitals:   11/15/18 1340 11/15/18 1345 11/15/18 1350 11/15/18 1355  BP: (!) 148/80 (!) 143/78 (!) 147/95 (!) 142/94  Pulse:      Resp: 16 19 14 18   Temp:      SpO2: 100% 100% 100% 100%  Weight:      Height:         Start Time: 1338 hrs. End Time: 1355 hrs.  Imaging Guidance (Spinal):           Type of Imaging Technique: Fluoroscopy Guidance (Spinal) Indication(s): Assistance in needle guidance and placement for procedures requiring needle placement in or near specific anatomical locations not easily accessible without such assistance. Exposure Time: Please see nurses notes. Contrast: None used. Fluoroscopic Guidance: I was personally present during the use of fluoroscopy. "Tunnel Vision Technique" used to obtain the best possible view of the target area. Parallax error corrected before commencing the procedure. "Direction-depth-direction" technique used to introduce the needle under continuous pulsed fluoroscopy. Once target was reached, antero-posterior, oblique, and lateral fluoroscopic projection used confirm needle placement in all planes. Images permanently stored in EMR. Interpretation: No contrast injected. I personally interpreted the imaging intraoperatively. Adequate needle placement confirmed in multiple planes. Permanent images saved into the patient's record.  Antibiotic Prophylaxis:   Anti-infectives (From admission, onward)   None     Indication(s): None identified  Post-operative Assessment:  Post-procedure Vital Signs:  Pulse/HCG Rate: 6771 Temp: 98 F (36.7 C) Resp: 18 BP: (!) 142/94 SpO2: 100 %  EBL: None  Complications: No immediate post-treatment complications observed by team, or reported by patient.  Note: The patient tolerated the entire procedure well. A repeat set of vitals were taken after the procedure and the patient was kept under observation following institutional policy, for this type of procedure. Post-procedural neurological assessment was performed, showing return to baseline, prior to discharge. The patient was provided with post-procedure discharge instructions, including a section on how to identify potential problems. Should any problems arise concerning this procedure, the patient  was given instructions to immediately contact us, at  any time, without hesitation. In any case, we plan to contact the patient by telephone for a follow-up status report regarding this interventional procedure.  Comments:  No additional relevant information.  Plan of Care  Orders:  Orders Placed This Encounter  Procedures   LUMBAR FACET(MEDIAL BRANCH NERVE BLOCK) MBNB    Scheduling Instructions:     Side: Bilateral     Level: L3-4, L4-5, & L5-S1 Facets (L2, L3, L4, L5, & S1 Medial Branch Nerves)     Sedation: With Sedation.     Timeframe: Today    Order Specific Question:   Where will this procedure be performed?    Answer:   [facility]   DG PAIN [facility] C-ARM 1-60 MIN NO REPORT    Intraoperative interpretation by procedural physician at [place].    Standing Status:   Standing    Number of Occurrences:   1    Order Specific Question:   Reason for exam:    Answer:   Assistance in needle guidance and placement for procedures requiring needle placement in or near specific anatomical locations not easily accessible without such assistance.   Provider attestation of informed consent for procedure/surgical case    I, the ordering provider, attest that I have discussed with the patient the benefits, risks, side effects, alternatives, likelihood of achieving goals and potential problems during recovery for the procedure that I have provided informed consent.    Standing Status:   Standing    Number of Occurrences:   1   Informed Consent Details: Transcribe to consent form and obtain patient signature    Standing Status:   Standing    Number of Occurrences:   1    Order Specific Question:   Procedure    Answer:   Bilateral Lumbar facet block (medial branch block) under fluoroscopic guidance. (See notes for levels.)    Order Specific Question:   Surgeon    Answer:   Yury Schaus A. Dossie Arbour, MD    Order Specific Question:   Indication/Reason    Answer:   Bilateral low back pain with or without lower extremity pain    Medications ordered for procedure: Meds ordered this encounter  Medications   lidocaine (XYLOCAINE) 2 % (with pres) injection 400 mg   DISCONTD: lactated ringers infusion 1,000 mL   DISCONTD: midazolam (VERSED) 5 MG/5ML injection 1-2 mg    Make sure Flumazenil is available in the pyxis when using this medication. If oversedation occurs, administer 0.2 mg IV over 15 sec. If after 45 sec no response, administer 0.2 mg again over 1 min; may repeat at 1 min intervals; not to exceed 4 doses (1 mg)   DISCONTD: fentaNYL (SUBLIMAZE) injection 25-50 mcg    Make sure Narcan is available in the pyxis when using this medication. In the event of respiratory depression (RR< 8/min): Titrate NARCAN (naloxone) in increments of 0.1 to 0.2 mg IV at 2-3 minute intervals, until desired degree of reversal.   ropivacaine (PF) 2 mg/mL (0.2%) (NAROPIN) injection 18 mL   triamcinolone acetonide (KENALOG-40) injection 80 mg   Medications administered: We administered lidocaine, ropivacaine (PF) 2 mg/mL (0.2%), and triamcinolone acetonide.  See the medical record for exact dosing, route, and time of administration.  Disposition: Discharge home  Discharge Date & Time: 11/15/2018; 1400 hrs.   Follow-up plan:   No follow-ups on file.     Recent Visits Date Type Provider Dept  11/07/18  Office Visit Milinda Pointer, MD [facility]  10/25/18 Procedure visit Milinda Pointer, MD [facility]  10/12/18 Office Visit Milinda Pointer, MD [facility]  08/22/18 Office Visit Milinda Pointer, MD [facility]  Showing recent visits within past 90 days and meeting all other requirements   Today's Visits Date Type Provider Dept  11/15/18 Procedure visit Milinda Pointer, MD [facility]  Showing today's visits and meeting all other requirements   Future Appointments No visits were found meeting these conditions.  Showing future appointments within next 90  days and meeting all other requirements   Primary Care Physician: Jack Burrow, MD Location: [facility] Note by: Jack Cola, MD Date: 11/15/2018; Time: 2:03 PM  Disclaimer:  Medicine is not an exact science. The only guarantee in medicine is that nothing is guaranteed. It is important to note that the decision to proceed with this intervention was based on the information collected from the patient. The Data and conclusions were drawn from the patient's questionnaire, the interview, and the physical examination. Because the information was provided in large part by the patient, it cannot be guaranteed that it has not been purposely or unconsciously manipulated. Every effort has been made to obtain as much relevant data as possible for this evaluation. It is important to note that the conclusions that lead to this procedure are derived in large part from the available data. Always take into account that the treatment will also be dependent on availability of resources and existing treatment guidelines, considered by other Pain Management Practitioners as being common knowledge and practice, at the time of the intervention. For Medico-Legal purposes, it is also important to point out that variation in procedural techniques and pharmacological choices are the acceptable norm. The indications, contraindications, technique, and results of the above procedure should only be interpreted and judged by a Board-Certified Interventional Pain Specialist with extensive familiarity and expertise in the same exact procedure and technique.

## 2018-11-15 NOTE — Progress Notes (Signed)
Safety precautions to be maintained throughout the outpatient stay will include: orient to surroundings, keep bed in low position, maintain call bell within reach at all times, provide assistance with transfer out of bed and ambulation.  

## 2018-11-16 ENCOUNTER — Telehealth: Payer: Self-pay | Admitting: *Deleted

## 2018-11-16 NOTE — Telephone Encounter (Signed)
Voicemail left with patient to please call our office if there are questions or concerns re; procedure on yesterday.  

## 2018-11-21 ENCOUNTER — Other Ambulatory Visit: Payer: Self-pay | Admitting: Pain Medicine

## 2018-11-21 DIAGNOSIS — R208 Other disturbances of skin sensation: Secondary | ICD-10-CM

## 2018-11-21 DIAGNOSIS — M792 Neuralgia and neuritis, unspecified: Secondary | ICD-10-CM

## 2018-12-04 NOTE — Progress Notes (Signed)
Pain Management Virtual Encounter Note - Virtual Visit via Telephone Telehealth (real-time audio visits between healthcare provider and patient).   Patient's Phone No. & Preferred Pharmacy:  [phone] (home); There is no such number on file (mobile).; (Preferred) [phone] No e-mail address on record  [id number] - [place], [place] - [address] [place] [address] Phone: [phone] Fax: [phone]    Pre-screening note:  Our staff contacted Jack Blanchard and offered him an "in person", "face-to-face" appointment versus a telephone encounter. He indicated preferring the telephone encounter, at this time.   Reason for Virtual Visit: COVID-19*  Social distancing based on CDC and AMA recommendations.   I contacted Jack Blanchard on 12/06/2018 via telephone.      I clearly identified myself as Jack Cola, MD. I verified that I was speaking with the correct person using two identifiers (Name: Jack Blanchard, and date of birth: 10-04-1950).  Advanced Informed Consent I sought verbal advanced consent from Jack Blanchard for virtual visit interactions. I informed Jack Blanchard of possible security and privacy concerns, risks, and limitations associated with providing "not-in-person" medical evaluation and management services. I also informed Jack Blanchard of the availability of "in-person" appointments. Finally, I informed him that there would be a charge for the virtual visit and that he could be  personally, fully or partially, financially responsible for it. [name] expressed understanding and agreed to proceed.   Historic Elements   Jack Blanchard is a 68 y.o. year old, male patient evaluated today after his last encounter by our practice on 11/21/2018. Jack Blanchard  has a past medical history of Asthma, Back pain, Fibromyalgia, and History of degenerative disc disease. He also  has a past surgical history that includes Leg Surgery; Back surgery; and Colonoscopy with propofol (N/A, 08/03/2017).  Jack Blanchard has a current medication list which includes the following prescription(s): albuterol, baclofen, calcium carbonate, celecoxib, vitamin d3, cholecalciferol, vitamin b-12, duloxetine, flomax, gabapentin, magnesium, and prednisone. He  reports that he has been smoking. He has never used smokeless tobacco. He reports current alcohol use. He reports current drug use. Drug: Marijuana. Jack Blanchard is allergic to vancomycin and acetaminophen.   HPI  Today, he is being contacted for a post-procedure assessment.  According to the patient he did rather well with this diagnostic injection and the pain right now its much better than it was before.  He refers that now it is tolerable.  I have instructed the patient to call us back when the pain comes back to the point where he wants [place] to repeat the injection.  Should he continue to have good benefit, for the duration of the local anesthetic, but not long-term, then we will consider radiofrequency ablation.  Post-Procedure Evaluation  Procedure: Diagnostic bilateral lumbar facet block #1 under fluoroscopic guidance, no sedation Pre-procedure pain level:  7/10 Post-procedure: 7/10 No initial benefit, possibly due to rapid discharge after no sedation procedure, without enough time to allow full onset of block.  Sedation: None.  Effectiveness during initial hour after procedure(Ultra-Short Term Relief): 100 %   Local anesthetic used: Long-acting (4-6 hours) Effectiveness: Defined as any analgesic benefit obtained secondary to the administration of local anesthetics. This carries significant diagnostic value as to the etiological location, or anatomical origin, of the pain. Duration of benefit is expected to coincide with the duration of the local anesthetic used.  Effectiveness during initial 4-6 hours after procedure(Short-Term Relief): 100 %   Long-term benefit: Defined as any  relief past the pharmacologic duration of the local anesthetics.  Effectiveness  past the initial 6 hours after procedure(Long-Term Relief): 50 %   Current benefits: Defined as benefit that persist at this time.   Analgesia:  >50% relief Function: Jack Blanchard reports improvement in function ROM: Jack Blanchard reports improvement in ROM  Pharmacotherapy Assessment  Analgesic: No opioid analgesics prescribed by our practice due to positive (+), unreported THC on (08/01/18) UDS.   Pertinent Labs   SAFETY SCREENING Profile Lab Results  Component Value Date   SARSCOV2NAA NOT DETECTED 11/11/2018   COVIDSOURCE NASOPHARYNGEAL 11/11/2018   Renal Function Lab Results  Component Value Date   BUN 11 08/01/2018   CREATININE 0.88 08/01/2018   BCR 13 08/01/2018   GFRAA 103 08/01/2018   GFRNONAA 90 08/01/2018   Hepatic Function Lab Results  Component Value Date   AST 18 08/01/2018   ALBUMIN 4.4 08/01/2018   UDS Summary  Date Value Ref Range Status  08/01/2018 FINAL  Final    Comment:    ==================================================================== TOXASSURE SELECT 13 (MW) ==================================================================== Test                             Result       Flag       Units Drug Present not Declared for Prescription Verification   Carboxy-THC                    301          UNEXPECTED ng/mg creat    Carboxy-THC is a metabolite of tetrahydrocannabinol  (THC).    Source of [facility] is most commonly illicit, but THC is also present    in a scheduled prescription medication. ==================================================================== Test                      Result    Flag   Units      Ref Range   Creatinine              147              mg/dL      >=20 ==================================================================== Declared Medications:  The flagging and interpretation on this report are based on the  following declared medications.  Unexpected results may arise from  inaccuracies in the declared medications.  **Note: The  testing scope of this panel does not include following  reported medications:  Albuterol  Baclofen  Celecoxib  Cholecalciferol  Duloxetine  Tamsulosin ==================================================================== For clinical consultation, please call [phone]. ====================================================================    Note: Above Lab results reviewed.  Recent imaging  DG PAIN CLINIC C-ARM 1-60 MIN NO REPORT Fluoro was used, but no Radiologist interpretation will be provided.  Please refer to "NOTES" tab for provider progress note.      Assessment  The primary encounter diagnosis was Lumbar facet syndrome (Bilateral) (R>L). 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  Plan of Care  I have discontinued Jack Blanchard's tamsulosin. I am also having him maintain his DULoxetine, cholecalciferol, albuterol, Vitamin D3, Magnesium, calcium carbonate, Vitamin B-12, baclofen, celecoxib, predniSONE, gabapentin, and Flomax.  Pharmacotherapy (Medications Ordered): No orders of the defined types were placed in this encounter.  Orders:  Orders Placed This Encounter  Procedures  . LUMBAR FACET(MEDIAL BRANCH  NERVE BLOCK) MBNB    Standing Status:   Standing    Number of Occurrences:   5    Standing Expiration Date:   12/06/2019    Scheduling Instructions:     Purpose: Diagnostic     Indication: Axial low back pain. Lumbosacral Spondylosis (M47.897).      Side: Bilateral     Level: L3-4, L4-5, & L5-S1 Facets (L2, L3, L4, L5, & S1 Medial Branch Nerves)     Sedation: With Sedation.     TIMEFRAME: PRN procedure. (Mr. Brandow will call when needed.)    Order Specific Question:   Where will this procedure be performed?    Answer:   [facility]   Follow-up plan:   Return if symptoms  worsen or fail to improve, for PRN Procedure: (B) L-FCT BLK #2.      Interventional management options:  Considering:   Diagnostic caudal ESI + epidurogram #2 (100% LE pain + 70% LBP improvement) Possible Racz procedure  Diagnostic LESI  Diagnostic right L2-3 LESI  Diagnostic right L3-4 LESI  Diagnostic bilateral L4 TFESI  Diagnostic bilateral L5 TFESI  Diagnostic bilateral lumbar facet block #2 (100/100/50)  Possible bilateral lumbar facet RFA  Diagnostic right IA knee injection (steroid)  Diagnostic right knee Hyalgan injection series S1N1    PRN Procedures:   Palliative right caudal ESI (for LEP)    Recent Visits Date Type Provider Dept  11/15/18 Procedure visit Milinda Pointer, MD [facility]  11/07/18 Office Visit Milinda Pointer, MD [facility]  10/25/18 Procedure visit Milinda Pointer, MD [facility]  10/12/18 Office Visit Milinda Pointer, MD [facility]  Showing recent visits within past 90 days and meeting all other requirements   Today's Visits Date Type Provider Dept  12/06/18 Office Visit Milinda Pointer, MD [facility]  Showing today's visits and meeting all other requirements   Future Appointments No visits were found meeting these conditions.  Showing future appointments within next 90 days and meeting all other requirements   I discussed the assessment and treatment plan with the patient. The patient was provided an opportunity to ask questions and all were answered. The patient agreed with the plan and demonstrated an understanding of the instructions.  Patient advised to call back or seek an in-person evaluation if the symptoms or condition worsens.  Total duration of non-face-to-face encounter: 12 minutes.  Note by: Jack Cola, MD Date: 12/06/2018; Time: 12:42 PM  Note: This dictation was prepared with Dragon dictation. Any transcriptional errors that may result from this process  are unintentional.  Disclaimer:  * Given the special circumstances of the COVID-19 pandemic, the federal government has announced that the Office for Civil Rights (OCR) will exercise its enforcement discretion and will not impose penalties on physicians using telehealth in the event of noncompliance with regulatory requirements under the [place] and [place] (HIPAA) in connection with the good faith provision of telehealth during the XNTZG-01 national public health emergency. ([place])

## 2018-12-05 ENCOUNTER — Encounter: Payer: Self-pay | Admitting: Pain Medicine

## 2018-12-05 ENCOUNTER — Telehealth: Payer: Self-pay | Admitting: *Deleted

## 2018-12-05 NOTE — Telephone Encounter (Signed)
Called patient about VV appt for tomorrow.  Went to [employer], and mailbox is full.

## 2018-12-06 ENCOUNTER — Other Ambulatory Visit: Payer: Self-pay

## 2018-12-06 ENCOUNTER — Ambulatory Visit: Payer: Medicare Other | Attending: Pain Medicine | Admitting: Pain Medicine

## 2018-12-06 DIAGNOSIS — M5442 Lumbago with sciatica, left side: Secondary | ICD-10-CM

## 2018-12-06 DIAGNOSIS — M47816 Spondylosis without myelopathy or radiculopathy, lumbar region: Secondary | ICD-10-CM | POA: Diagnosis not present

## 2018-12-06 DIAGNOSIS — M5441 Lumbago with sciatica, right side: Secondary | ICD-10-CM

## 2018-12-06 DIAGNOSIS — M961 Postlaminectomy syndrome, not elsewhere classified: Secondary | ICD-10-CM

## 2018-12-06 DIAGNOSIS — M4310 Spondylolisthesis, site unspecified: Secondary | ICD-10-CM

## 2018-12-06 DIAGNOSIS — G8929 Other chronic pain: Secondary | ICD-10-CM

## 2019-01-20 ENCOUNTER — Telehealth: Payer: Self-pay | Admitting: *Deleted

## 2019-01-20 NOTE — Telephone Encounter (Signed)
Returned call about lung screening and reviewed smoking history which includes mainly cigar smoking. Cigarette smoking has been limited to <5 cigarettes per day for 49 years and now less than that. Patient is aware that he is not eligible for lung cancer screening.

## 2019-01-20 NOTE — Telephone Encounter (Signed)
Received referral for low dose lung cancer screening CT scan. Message left at phone number listed in EMR for patient to call me back to facilitate scheduling scan.  

## 2019-02-09 ENCOUNTER — Other Ambulatory Visit: Payer: Self-pay | Admitting: Pain Medicine

## 2019-02-09 DIAGNOSIS — M4697 Unspecified inflammatory spondylopathy, lumbosacral region: Secondary | ICD-10-CM

## 2019-04-02 ENCOUNTER — Other Ambulatory Visit: Payer: Self-pay | Admitting: Pain Medicine

## 2019-04-02 DIAGNOSIS — M170 Bilateral primary osteoarthritis of knee: Secondary | ICD-10-CM

## 2019-04-02 DIAGNOSIS — M47816 Spondylosis without myelopathy or radiculopathy, lumbar region: Secondary | ICD-10-CM

## 2019-04-04 ENCOUNTER — Encounter (INDEPENDENT_AMBULATORY_CARE_PROVIDER_SITE_OTHER): Payer: Self-pay

## 2019-04-04 ENCOUNTER — Encounter (INDEPENDENT_AMBULATORY_CARE_PROVIDER_SITE_OTHER): Payer: Self-pay | Admitting: Vascular Surgery

## 2019-04-04 ENCOUNTER — Other Ambulatory Visit: Payer: Self-pay

## 2019-04-04 ENCOUNTER — Ambulatory Visit (INDEPENDENT_AMBULATORY_CARE_PROVIDER_SITE_OTHER): Payer: Medicare Other | Admitting: Vascular Surgery

## 2019-04-04 VITALS — BP 153/81 | HR 73 | Resp 16 | Wt 302.0 lb

## 2019-04-04 DIAGNOSIS — M79604 Pain in right leg: Secondary | ICD-10-CM | POA: Diagnosis not present

## 2019-04-04 DIAGNOSIS — F17210 Nicotine dependence, cigarettes, uncomplicated: Secondary | ICD-10-CM | POA: Diagnosis not present

## 2019-04-04 DIAGNOSIS — G629 Polyneuropathy, unspecified: Secondary | ICD-10-CM

## 2019-04-04 DIAGNOSIS — Z716 Tobacco abuse counseling: Secondary | ICD-10-CM

## 2019-04-04 DIAGNOSIS — M5137 Other intervertebral disc degeneration, lumbosacral region: Secondary | ICD-10-CM | POA: Diagnosis not present

## 2019-04-04 DIAGNOSIS — F17200 Nicotine dependence, unspecified, uncomplicated: Secondary | ICD-10-CM | POA: Insufficient documentation

## 2019-04-04 NOTE — Assessment & Plan Note (Signed)
The patient has right lower extremity pain that is not entirely clear in its etiology.  Particularly given a previous history of vascular repair for trauma and ongoing tobacco use, arterial insufficiency would be high on the differential diagnosis.  I discussed the pathophysiology and natural history of arterial insufficiency and why causes symptoms.  I recommend noninvasive studies be performed in the near future at his convenience for further evaluation of his vascular status in the lower extremities.  I have discussed the reason and rationale for treatment and the options available for intervention and surgery should arterial insufficiency be a primary cause.  This should be done preliminary to a knee replacement as if his perfusion is markedly reduced, wound healing could be impaired.  We will see the patient back next week with noninvasive studies to determine further treatment options.

## 2019-04-04 NOTE — Assessment & Plan Note (Signed)

## 2019-04-04 NOTE — Progress Notes (Signed)
Patient ID: Jack Blanchard, male   DOB: 23-Apr-1951, 68 y.o.   MRN: [id number]  Chief Complaint  Patient presents with  . New Patient (Initial Visit)    ref Jack Blanchard for rle pain    HPI Jack Blanchard is a 68 y.o. male.  I am asked to see the patient by Dr. Harlow Blanchard for evaluation of leg pain.  The patient reports that about 4 5 years ago he had a life-threatening trauma to his right leg with major hemorrhage and had to have what sounds like a stent placed in his right thigh artery.  Within a few months of that, he has had worsening right leg pain.  He has multiple potential causes of his leg pain and this has largely been chalked up to neurogenic pain from his back as well as arthritic right knee pain.  The pain wakes him at night.  It involves cramping and tiredness in his legs with minimal amounts of walking.  He really does not have similar symptoms in his left leg.  He says his circulation has not been checked since the procedure was done several years ago.  That was done in [place] and he has not had any vascular evaluation or treatment locally.  No ulceration or infection.  No fever or chills.     Past Medical History:  Diagnosis Date  . Asthma   . Back pain   . Fibromyalgia   . History of degenerative disc disease     Past Surgical History:  Procedure Laterality Date  . BACK SURGERY     lumbar surgery with rods placed  . COLONOSCOPY WITH PROPOFOL N/A 08/03/2017   Procedure: COLONOSCOPY WITH PROPOFOL;  Surgeon: Lin Landsman, MD;  Location: [facility] ENDOSCOPY;  Service: Gastroenterology;  Laterality: N/A;  . LEG SURGERY      Family History No bleeding disorders, clotting disorders, autoimmune diseases, aneurysms, or porphyria   Social History   Tobacco Use  . Smoking status: Current Some Day Smoker  . Smokeless tobacco: Never Used  Substance Use Topics  . Alcohol use: Yes    Comment: 1/5 a week  . Drug use: Yes    Types: Marijuana    Allergies  Allergen  Reactions  . Other   . Vancomycin   . Acetaminophen Palpitations    Current Outpatient Medications  Medication Sig Dispense Refill  . albuterol (VENTOLIN HFA) 108 (90 Base) MCG/ACT inhaler Inhale 2 puffs into the lungs every 4 (four) hours as needed.    . baclofen (LIORESAL) 10 MG tablet Take 1-2 tablets (10-20 mg total) by mouth 3 (three) times daily for 30 days. 180 tablet 0  . calcium carbonate (CALCIUM 600) 600 MG TABS tablet Take 1 tablet (600 mg total) by mouth 2 (two) times daily with a meal for 30 days. 60 tablet 0  . celecoxib (CELEBREX) 200 MG capsule Take 1 capsule (200 mg total) by mouth 2 (two) times daily for 30 days. 60 capsule 0  . Cholecalciferol 125 MCG (5000 UT) capsule cholecalciferol (vitamin D3) 125 mcg (5,000 unit) capsule  PLEASE SEE ATTACHED FOR DETAILED DIRECTIONS    . Cyanocobalamin (QC VITAMIN B12) 5000 MCG SUBL Vitamin B-12 5,000 mcg sublingual tablet  PLACE 1 TABLET (5,000 MCG TOTAL) UNDER THE TONGUE DAILY FOR 30 DAYS.    Marland Kitchen Cyanocobalamin (VITAMIN B-12) 5000 MCG SUBL Place 1 tablet (5,000 mcg total) under the tongue daily for 30 days. 30 each 0  . DULoxetine (CYMBALTA) 60 MG capsule Take  60 mg by mouth daily.    Marland Kitchen FLOMAX 0.4 MG CAPS capsule     . gabapentin (NEURONTIN) 300 MG capsule Start by taking 1 cap (300 mg) PO HS. Increase by 1 cap every 7 days until taking 3 cap (900 mg) PO HS. 90 capsule 0  . Magnesium Oxide 500 MG CAPS magnesium oxide 500 mg capsule  TAKE 1 CAPSULE (500 MG TOTAL) BY MOUTH 2 (TWO) TIMES DAILY AT 8 AM AND 10 PM.    . albuterol (PROVENTIL) 2 MG tablet Take 2 mg by mouth 3 (three) times daily.    . cholecalciferol (VITAMIN D3) 25 MCG (1000 UT) tablet Take 1,000 Units by mouth daily.    . predniSONE (DELTASONE) 20 MG tablet      No current facility-administered medications for this visit.       REVIEW OF SYSTEMS (Negative unless checked)  Constitutional: [] Weight loss  [] Fever  [] Chills Cardiac: [] Chest pain   [] Chest pressure    [] Palpitations   [] Shortness of breath when laying flat   [] Shortness of breath at rest   [] Shortness of breath with exertion. Vascular:  [x] Pain in legs with walking   [x] Pain in legs at rest   [] Pain in legs when laying flat   [x] Claudication   [] Pain in feet when walking  [] Pain in feet at rest  [] Pain in feet when laying flat   [] History of DVT   [] Phlebitis   [] Swelling in legs   [] Varicose veins   [] Non-healing ulcers Pulmonary:   [] Uses home oxygen   [] Productive cough   [] Hemoptysis   [] Wheeze  [] COPD   [x] Asthma Neurologic:  [] Dizziness  [] Blackouts   [] Seizures   [] History of stroke   [] History of TIA  [] Aphasia   [] Temporary blindness   [] Dysphagia   [] Weakness or numbness in arms   [x] Weakness or numbness in legs Musculoskeletal:  [x] Arthritis   [] Joint swelling   [x] Joint pain   [x] Low back pain Hematologic:  [] Easy bruising  [] Easy bleeding   [] Hypercoagulable state   [] Anemic  [] Hepatitis Gastrointestinal:  [] Blood in stool   [] Vomiting blood  [] Gastroesophageal reflux/heartburn   [] Abdominal pain Genitourinary:  [] Chronic kidney disease   [] Difficult urination  [] Frequent urination  [] Burning with urination   [] Hematuria Skin:  [] Rashes   [] Ulcers   [] Wounds Psychological:  [] History of anxiety   []  History of major depression.    Physical Exam BP (!) 153/81 (BP Location: Right Arm)   Pulse 73   Resp 16   Wt (!) 302 lb (137 kg)   BMI 42.12 kg/m  Gen:  WD/WN, NAD. Obese  Head: [place]/AT, No temporalis wasting.  Ear/Nose/Throat: Hearing grossly intact, nares w/o erythema or drainage, oropharynx w/o Erythema/Exudate Eyes: Conjunctiva clear, sclera non-icteric  Neck: trachea midline.  No JVD.  Pulmonary:  Good air movement, respirations not labored, no use of accessory muscles  Cardiac: RRR, no JVD Vascular:  Vessel Right Left  Radial Palpable Palpable                          DP 1+ 2+  PT NP 1+   Gastrointestinal:. No masses, surgical incisions, or scars.  Musculoskeletal: M/S 5/5 throughout.  Extremities without ischemic changes.  No deformity or atrophy. Trace RLE edema. Neurologic: Sensation grossly intact in extremities.  Symmetrical.  Speech is fluent. Motor exam as listed above. Psychiatric: Judgment intact, Mood & affect appropriate for pt's clinical situation. Dermatologic: No rashes or ulcers noted.  No cellulitis or open wounds.    Radiology No results found.  Labs No results found for this or any previous visit (from the past 2160 hour(s)).  Assessment/Plan:  Peripheral neuropathy This is likely a significant cause of his lower extremity pain, vascular disease may be playing a contributing role as well.  Noninvasive studies planned as below.  DDD (degenerative disc disease), lumbosacral Previous back surgery for significant disease and neurogenic claudication from his back disease may be creating his lower extremity symptoms although evaluation for vascular disease as a cause is prudent as well given his atherosclerotic risk factors including ongoing tobacco use.  Tobacco use disorder We had a discussion for approximately 3 minutes regarding the absolute need for smoking cessation due to the deleterious nature of tobacco on the vascular system. We discussed the tobacco use would diminish patency of any intervention, and likely significantly worsen progressio of disease. We discussed multiple agents for quitting including replacement therapy or medications to reduce cravings such as Chantix. The patient voices their understanding of the importance of smoking cessation.   Pain in limb The patient has right lower extremity pain that is not entirely clear in its etiology.  Particularly given a previous history of vascular repair for trauma and ongoing tobacco use, arterial insufficiency would be high on the differential diagnosis.  I discussed the pathophysiology and natural history of arterial insufficiency and why causes symptoms.   I recommend noninvasive studies be performed in the near future at his convenience for further evaluation of his vascular status in the lower extremities.  I have discussed the reason and rationale for treatment and the options available for intervention and surgery should arterial insufficiency be a primary cause.  This should be done preliminary to a knee replacement as if his perfusion is markedly reduced, wound healing could be impaired.  We will see the patient back next week with noninvasive studies to determine further treatment options.      Leotis Pain 04/04/2019, 11:52 AM   This note was created with Dragon medical transcription system.  Any errors from dictation are unintentional.

## 2019-04-04 NOTE — Patient Instructions (Signed)
Peripheral Vascular Disease  Peripheral vascular disease (PVD) is a disease of the blood vessels that are not part of your heart and brain. A simple term for PVD is poor circulation. In most cases, PVD narrows the blood vessels that carry blood from your heart to the rest of your body. This can reduce the supply of blood to your arms, legs, and internal organs, like your stomach or kidneys. However, PVD most often affects a person's lower legs and feet. Without treatment, PVD tends to get worse. PVD can also lead to acute ischemic limb. This is when an arm or leg suddenly cannot get enough blood. This is a medical emergency. Follow these instructions at home: Lifestyle  Do not use any products that contain nicotine or tobacco, such as cigarettes and e-cigarettes. If you need help quitting, ask your doctor.  Lose weight if you are overweight. Or, stay at a healthy weight as told by your doctor.  Eat a diet that is low in fat and cholesterol. If you need help, ask your doctor.  Exercise regularly. Ask your doctor for activities that are right for you. General instructions  Take over-the-counter and prescription medicines only as told by your doctor.  Take good care of your feet: ? Wear comfortable shoes that fit well. ? Check your feet often for any cuts or sores.  Keep all follow-up visits as told by your doctor This is important. Contact a doctor if:  You have cramps in your legs when you walk.  You have leg pain when you are at rest.  You have coldness in a leg or foot.  Your skin changes.  You are unable to get or have an erection (erectile dysfunction).  You have cuts or sores on your feet that do not heal. Get help right away if:  Your arm or leg turns cold, numb, and blue.  Your arms or legs become red, warm, swollen, painful, or numb.  You have chest pain.  You have trouble breathing.  You suddenly have weakness in your face, arm, or leg.  You become very  confused or you cannot speak.  You suddenly have a very bad headache.  You suddenly cannot see. Summary  Peripheral vascular disease (PVD) is a disease of the blood vessels.  A simple term for PVD is poor circulation. Without treatment, PVD tends to get worse.  Treatment may include exercise, low fat and low cholesterol diet, and quitting smoking. This information is not intended to replace advice given to you by your health care provider. Make sure you discuss any questions you have with your health care provider. Document Released: 07/29/2009 Document Revised: 04/16/2017 Document Reviewed: 06/11/2016 Elsevier Patient Education  2020 [employer] Inc[employer]  

## 2019-04-04 NOTE — Assessment & Plan Note (Signed)
Previous back surgery for significant disease and neurogenic claudication from his back disease may be creating his lower extremity symptoms although evaluation for vascular disease as a cause is prudent as well given his atherosclerotic risk factors including ongoing tobacco use.

## 2019-04-04 NOTE — Assessment & Plan Note (Signed)
This is likely a significant cause of his lower extremity pain, vascular disease may be playing a contributing role as well.  Noninvasive studies planned as below.

## 2019-04-10 ENCOUNTER — Ambulatory Visit (INDEPENDENT_AMBULATORY_CARE_PROVIDER_SITE_OTHER): Payer: Medicare Other | Admitting: Nurse Practitioner

## 2019-04-10 ENCOUNTER — Other Ambulatory Visit: Payer: Self-pay

## 2019-04-10 ENCOUNTER — Ambulatory Visit (INDEPENDENT_AMBULATORY_CARE_PROVIDER_SITE_OTHER): Payer: Medicare Other

## 2019-04-10 ENCOUNTER — Encounter (INDEPENDENT_AMBULATORY_CARE_PROVIDER_SITE_OTHER): Payer: Self-pay | Admitting: Nurse Practitioner

## 2019-04-10 VITALS — BP 168/93 | HR 69 | Resp 16 | Wt 300.0 lb

## 2019-04-10 DIAGNOSIS — M170 Bilateral primary osteoarthritis of knee: Secondary | ICD-10-CM

## 2019-04-10 DIAGNOSIS — M79604 Pain in right leg: Secondary | ICD-10-CM | POA: Diagnosis not present

## 2019-04-10 DIAGNOSIS — F17200 Nicotine dependence, unspecified, uncomplicated: Secondary | ICD-10-CM | POA: Diagnosis not present
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
## 2019-04-17 ENCOUNTER — Encounter: Payer: Self-pay | Admitting: Cardiovascular Disease

## 2019-04-17 ENCOUNTER — Other Ambulatory Visit: Payer: Self-pay

## 2019-04-17 ENCOUNTER — Ambulatory Visit (INDEPENDENT_AMBULATORY_CARE_PROVIDER_SITE_OTHER): Payer: Medicare Other | Admitting: Cardiovascular Disease

## 2019-04-17 ENCOUNTER — Ambulatory Visit (INDEPENDENT_AMBULATORY_CARE_PROVIDER_SITE_OTHER): Payer: Medicare Other

## 2019-04-17 VITALS — BP 145/79 | HR 74 | Ht 71.0 in | Wt 302.8 lb

## 2019-04-17 DIAGNOSIS — I479 Paroxysmal tachycardia, unspecified: Secondary | ICD-10-CM

## 2019-04-17 DIAGNOSIS — R000 Tachycardia, unspecified: Secondary | ICD-10-CM

## 2019-04-17 DIAGNOSIS — F17200 Nicotine dependence, unspecified, uncomplicated: Secondary | ICD-10-CM

## 2019-04-17 DIAGNOSIS — Z01810 Encounter for preprocedural cardiovascular examination: Secondary | ICD-10-CM | POA: Diagnosis not present

## 2019-04-17 DIAGNOSIS — R0602 Shortness of breath: Secondary | ICD-10-CM

## 2019-04-17 DIAGNOSIS — M1990 Unspecified osteoarthritis, unspecified site: Secondary | ICD-10-CM
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
## 2019-04-17 NOTE — Patient Instructions (Addendum)
We will order a ZIo monitor for paroxysmal tachycardia  Medication Instructions:  No changes  If you need a refill on your cardiac medications before your next appointment, please call your pharmacy.    Lab work: No new labs needed   If you have labs (blood work) drawn today and your tests are completely normal, you will receive your results only by: Marland Kitchen MyChart Message (if you have MyChart) OR . A paper copy in the mail If you have any lab test that is abnormal or we need to change your treatment, we will call you to review the results.   Testing/Procedures: We will order an echo for shortness of breath Your physician has requested that you have an echocardiogram. Echocardiography is a painless test that uses sound waves to create images of your heart. It provides your doctor with information about the size and shape of your heart and how well your heart's chambers and valves are working. This procedure takes approximately one hour. There are no restrictions for this procedure.  We will order a ZIo monitor for paroxysmal tachycardia Your physician has recommended that you wear a Zio monitor. This monitor is a medical device that records the heart's electrical activity. Doctors most often use these monitors to diagnose arrhythmias. Arrhythmias are problems with the speed or rhythm of the heartbeat. The monitor is a small device applied to your chest. You can wear one while you do your normal daily activities. While wearing this monitor if you have any symptoms to push the button and record what you felt. Once you have worn this monitor for the period of time provider prescribed (Usually 14 days), you will return the monitor device in the postage paid box. Once it is returned they will download the data collected and provide [place] with a report which the provider will then review and we will call you with those results. Important tips:  1. Avoid showering during the first 24 hours of wearing  the monitor. 2. Avoid excessive sweating to help maximize wear time. 3. Do not submerge the device, no hot tubs, and no swimming pools. 4. Keep any lotions or oils away from the patch. 5. After 24 hours you may shower with the patch on. Take brief showers with your back facing the shower head.  6. Do not remove patch once it has been placed because that will interrupt data and decrease adhesive wear time. 7. Push the button when you have any symptoms and write down what you were feeling. 8. Once you have completed wearing your monitor, remove and place into box which has postage paid and place in your outgoing mailbox.  9. If for some reason you have misplaced your box then call our office and we can provide another box and/or mail it off for you.         Follow-Up: At [facility], you and your health needs are our priority.  As part of our continuing mission to provide you with exceptional heart care, we have created designated Provider Care Teams.  These Care Teams include your primary Cardiologist (physician) and Advanced Practice Providers (APPs -  Physician Assistants and Nurse Practitioners) who all work together to provide you with the care you need, when you need it.  . You will need a follow up appointment in 1 month   . Providers on your designated Care Team:   . Murray Hodgkins, NP . Christell Faith, PA-C . Marrianne Mood, PA-C  Any Other Special Instructions  Will Be Listed Below (If Applicable).  For educational health videos Log in to : [email or web address] Or : [email or web address], password : [email or web address]

## 2019-04-18 ENCOUNTER — Other Ambulatory Visit: Payer: Self-pay | Admitting: Orthopedic Surgery

## 2019-04-18 ENCOUNTER — Telehealth: Payer: Self-pay | Admitting: Cardiovascular Disease

## 2019-04-18 NOTE — Telephone Encounter (Signed)
Patient would like to know when he can shower , he is currently wearing a monitor.

## 2019-04-18 NOTE — Telephone Encounter (Signed)
Spoke with patient and reviewed that it should be 24 hours before he can shower and that orange card should have that information as well as the number if he should have any further questions. He was appreciative for the call, verbalized understanding of instructions, and had no further questions at this time.

## 2019-04-26 ENCOUNTER — Other Ambulatory Visit: Admission: RE | Admit: 2019-04-26 | Payer: Medicare Other | Source: Ambulatory Visit

## 2019-04-28 ENCOUNTER — Other Ambulatory Visit: Payer: Medicare Other

## 2019-05-03 ENCOUNTER — Ambulatory Visit: Admission: RE | Admit: 2019-05-03 | Payer: Medicare Other | Source: Home / Self Care | Admitting: Orthopedic Surgery

## 2019-05-03 ENCOUNTER — Encounter: Admission: RE | Payer: Self-pay | Source: Home / Self Care

## 2019-05-03 SURGERY — ARTHROPLASTY, KNEE, TOTAL
Anesthesia: Spinal | Site: Knee | Laterality: Right

## 2019-05-08 ENCOUNTER — Other Ambulatory Visit: Payer: Medicare Other

## 2019-05-09 ENCOUNTER — Other Ambulatory Visit: Payer: Self-pay | Admitting: Family Medicine

## 2019-05-09 ENCOUNTER — Ambulatory Visit (INDEPENDENT_AMBULATORY_CARE_PROVIDER_SITE_OTHER): Payer: Medicare Other

## 2019-05-09 ENCOUNTER — Other Ambulatory Visit: Payer: Self-pay

## 2019-05-09 DIAGNOSIS — R0602 Shortness of breath: Secondary | ICD-10-CM | POA: Diagnosis not present

## 2019-05-09 DIAGNOSIS — I479 Paroxysmal tachycardia, unspecified: Secondary | ICD-10-CM

## 2019-05-09 DIAGNOSIS — M79641 Pain in right hand: Secondary | ICD-10-CM

## 2019-05-09 DIAGNOSIS — M79642 Pain in left hand: Secondary | ICD-10-CM
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
## 2019-05-23 ENCOUNTER — Telehealth: Payer: Self-pay

## 2019-05-23 ENCOUNTER — Ambulatory Visit: Payer: Medicare Other | Admitting: Cardiovascular Disease

## 2019-05-23 NOTE — Telephone Encounter (Signed)
-----   Message from Minna Merritts, MD sent at 05/18/2019  2:20 PM EST ----- Echocardiogram Normal LV function Normal RV size and function No significant valve disease Unable to assess right heart pressures

## 2019-05-23 NOTE — Telephone Encounter (Signed)
Call to patient to discuss echo results and POC>   No further orders at this time. Confirmed upcoming appt next week.   No further questions at this time.
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
## 2019-05-31 ENCOUNTER — Other Ambulatory Visit: Payer: Self-pay

## 2019-05-31 ENCOUNTER — Ambulatory Visit (INDEPENDENT_AMBULATORY_CARE_PROVIDER_SITE_OTHER): Payer: Medicare Other | Admitting: Cardiovascular Disease

## 2019-05-31 ENCOUNTER — Encounter: Payer: Self-pay | Admitting: Cardiovascular Disease

## 2019-05-31 VITALS — BP 120/70 | HR 79 | Ht 71.0 in | Wt 306.8 lb

## 2019-05-31 DIAGNOSIS — G894 Chronic pain syndrome: Secondary | ICD-10-CM | POA: Diagnosis not present

## 2019-05-31 DIAGNOSIS — I479 Paroxysmal tachycardia, unspecified: Secondary | ICD-10-CM | POA: Diagnosis not present

## 2019-05-31 DIAGNOSIS — F17200 Nicotine dependence, unspecified, uncomplicated: Secondary | ICD-10-CM | POA: Diagnosis not present

## 2019-05-31 DIAGNOSIS — R0602 Shortness of breath: Secondary | ICD-10-CM

## 2019-05-31 NOTE — Patient Instructions (Signed)

## 2019-08-11 ENCOUNTER — Other Ambulatory Visit: Payer: Self-pay | Admitting: Family Medicine

## 2019-08-11 DIAGNOSIS — Z13820 Encounter for screening for osteoporosis: Secondary | ICD-10-CM

## 2019-08-11 DIAGNOSIS — Z0000 Encounter for general adult medical examination without abnormal findings: Secondary | ICD-10-CM

## 2019-08-30 ENCOUNTER — Other Ambulatory Visit: Payer: Medicare Other

## 2019-09-27 IMAGING — CR DG KNEE COMPLETE 4+V*R*
5 series · 5 of 5 positions shown · non-contrast
Comparison: None.

CLINICAL DATA: Bilateral knee pain for many years, worse recently,
no injury

EXAM:
RIGHT KNEE - COMPLETE 4+ VIEW

[knee obl (1 of 2)]
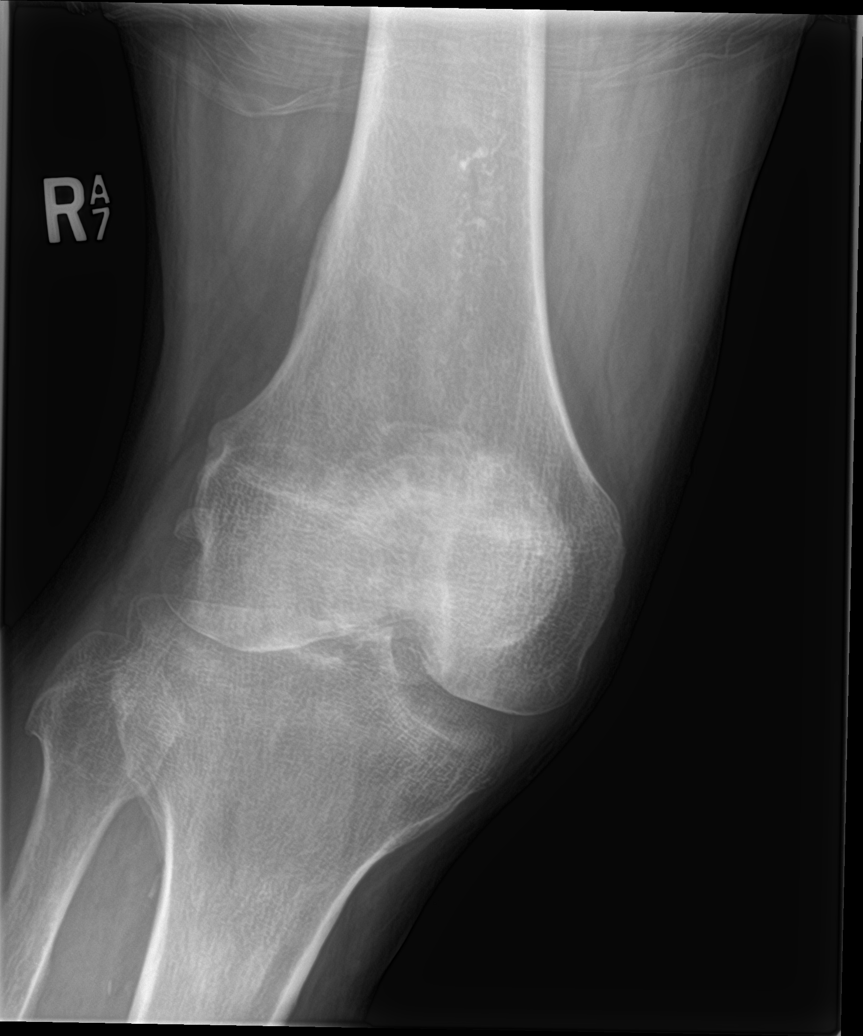

[knee lat]
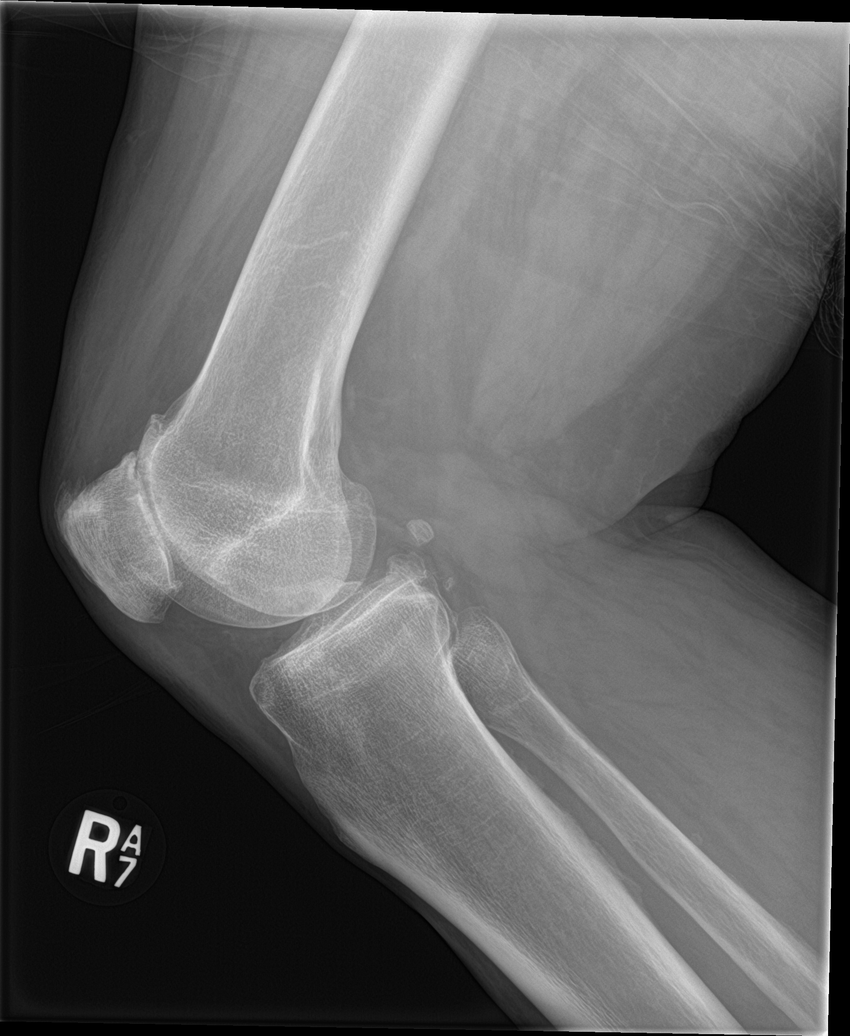

[knee ap]
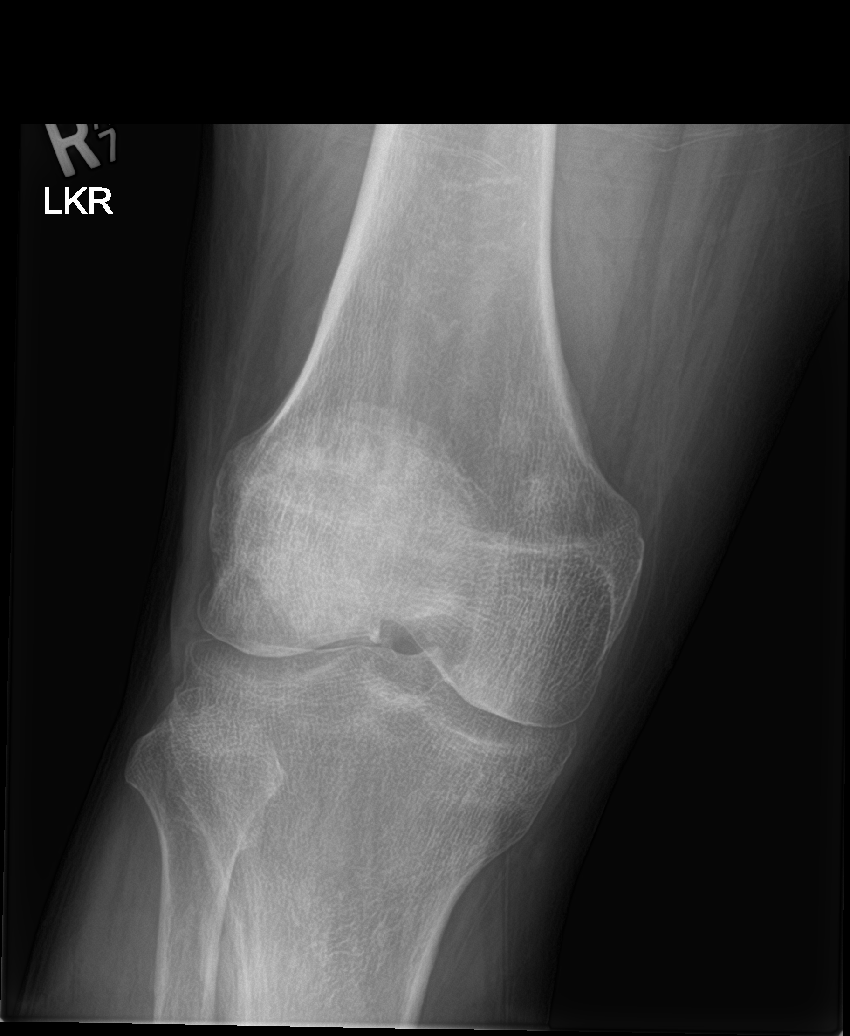

[sunrise]
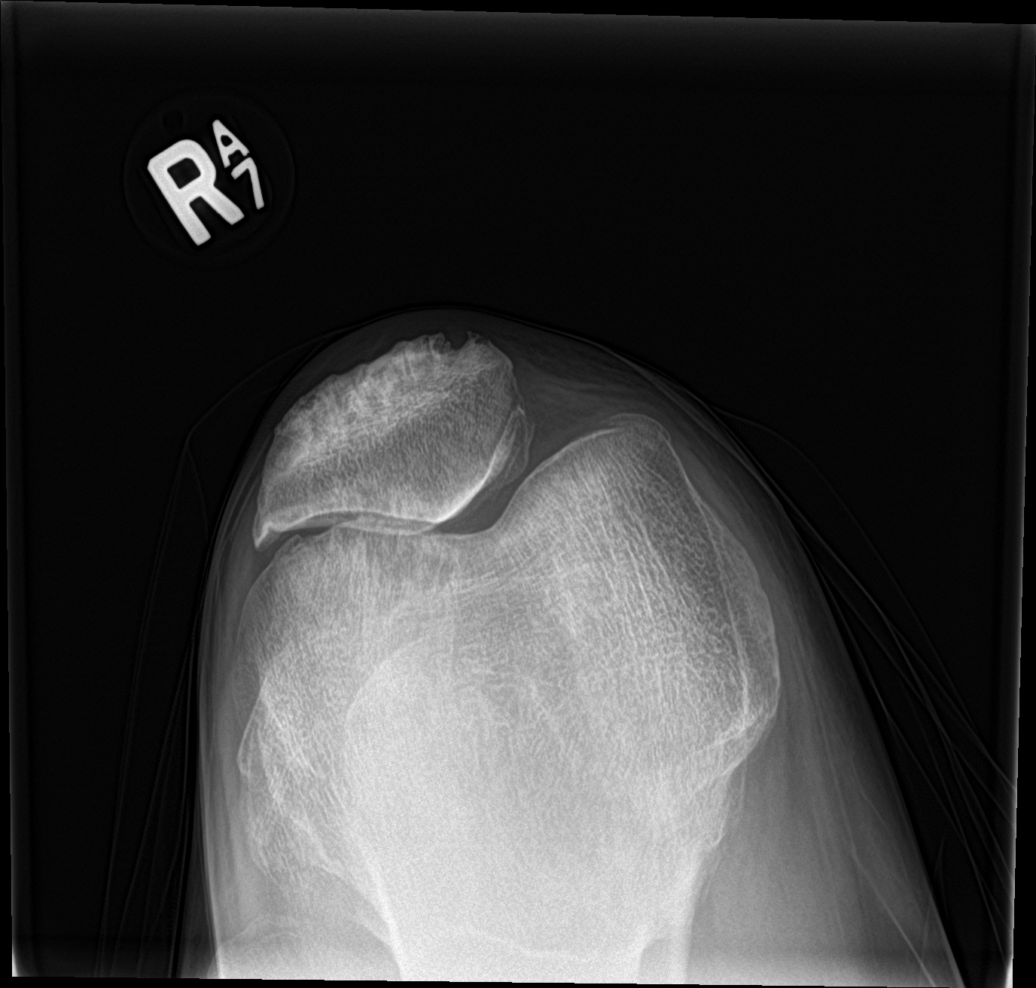

[knee obl (2 of 2)]
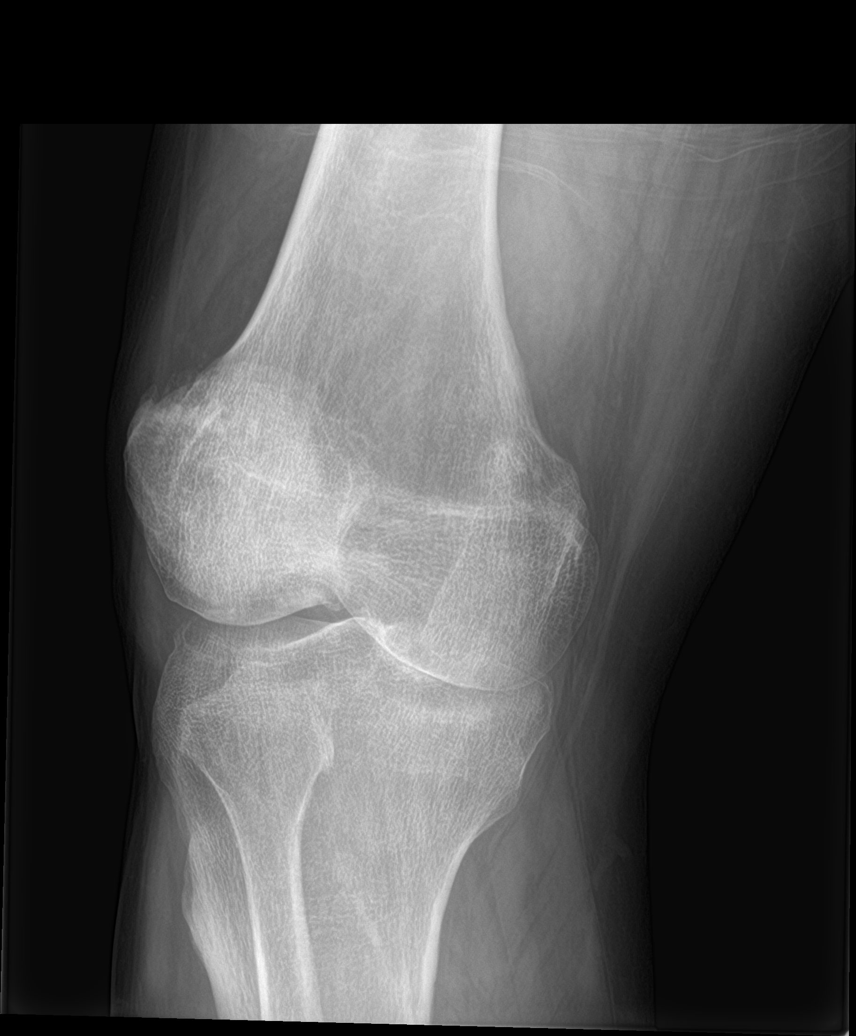

[5 of 5 positions shown; findings below may reference images not displayed]

FINDINGS: There is tricompartmental degenerative joint disease of the right
knee with some spurring from each compartment appear however primary
loss of joint space involves the patellofemoral compartment with
almost complete loss of joint space and spurring. No acute fracture
is seen and no joint effusion is noted. The patella is slightly
laterally positioned.
IMPRESSION: 1. Tricompartmental degenerative joint disease of the right knee
primarily involving the patellofemoral compartment.
2. No fracture or effusion.
3. The patella is laterally positioned.

## 2020-06-20 ENCOUNTER — Other Ambulatory Visit: Payer: Self-pay

## 2020-07-09 ENCOUNTER — Telehealth: Payer: Self-pay

## 2020-07-09 NOTE — Telephone Encounter (Signed)
Returned patients call. Informed patient his appt will be a telephone call. Pt verbalized understanding.

## 2020-07-11 ENCOUNTER — Other Ambulatory Visit: Payer: Self-pay

## 2020-07-11 ENCOUNTER — Telehealth (INDEPENDENT_AMBULATORY_CARE_PROVIDER_SITE_OTHER): Payer: Self-pay | Admitting: Gastroenterology

## 2020-07-11 DIAGNOSIS — Z86010 Personal history of colonic polyps: Secondary | ICD-10-CM

## 2020-07-11 MED ORDER — NA SULFATE-K SULFATE-MG SULF 17.5-3.13-1.6 GM/177ML PO SOLN: 354.0000 mL | Freq: Once | ORAL | 0 refills | Status: AC

## 2020-07-11 NOTE — Progress Notes (Signed)
Gastroenterology Pre-Procedure Review  Request Date: 08/06/2020 Requesting Physician: Dr. Marius Ditch  PATIENT REVIEW QUESTIONS: The patient responded to the following health history questions as indicated:    1. Are you having any GI issues? no 2. Do you have a personal history of Polyps? Yes 2 years ago  3. Do you have a family history of Colon Cancer or Polyps? no 4. Diabetes Mellitus? no 5. Joint replacements in the past 12 months?yes 2 knee replacement  6. Major health problems in the past 3 months?no 7. Any artificial heart valves, MVP, or defibrillator?no    MEDICATIONS & ALLERGIES:    Patient reports the following regarding taking any anticoagulation/antiplatelet therapy:   Plavix, Coumadin, Eliquis, Xarelto, Lovenox, Pradaxa, Brilinta, or Effient? No   Aspirin? no  Patient confirms/reports the following medications:  Current Outpatient Medications  Medication Sig Dispense Refill  . albuterol (PROVENTIL) (2.5 MG/3ML) 0.083% nebulizer solution Take 2.5 mg by nebulization every 4 (four) hours as needed for wheezing or shortness of breath.    Marland Kitchen albuterol (VENTOLIN HFA) 108 (90 Base) MCG/ACT inhaler Inhale 2 puffs into the lungs every 4 (four) hours as needed for wheezing or shortness of breath.     Marland Kitchen atorvastatin (LIPITOR) 20 MG tablet Take 20 mg by mouth at bedtime.    . baclofen (LIORESAL) 10 MG tablet Take 1-2 tablets (10-20 mg total) by mouth 3 (three) times daily for 30 days. (Patient taking differently: Take 10 mg by mouth 3 (three) times daily. ) 180 tablet 0  . CALCIUM PO Take 1 tablet by mouth daily.    . celecoxib (CELEBREX) 200 MG capsule Take 1 capsule (200 mg total) by mouth 2 (two) times daily for 30 days. (Patient taking differently: Take 400 mg by mouth 2 (two) times daily. ) 60 capsule 0  . Cyanocobalamin (B-12 PO) Take 1 tablet by mouth daily.    Marland Kitchen FLOMAX 0.4 MG CAPS capsule Take 0.4 mg by mouth daily.     Marland Kitchen gabapentin (NEURONTIN) 300 MG capsule Start by taking 1 cap  (300 mg) PO HS. Increase by 1 cap every 7 days until taking 3 cap (900 mg) PO HS. (Patient taking differently: Take 600 mg by mouth at bedtime. ) 90 capsule 0  . MAGNESIUM PO Take 1 tablet by mouth daily.    Marland Kitchen OVER THE COUNTER MEDICATION Apply 1 application topically daily as needed (pain). CBD cream     No current facility-administered medications for this visit.    Patient confirms/reports the following allergies:  Allergies  Allergen Reactions  . Vancomycin Itching and Swelling    angioedema  . Acetaminophen Palpitations    No orders of the defined types were placed in this encounter.   AUTHORIZATION INFORMATION Primary Insurance: 1D#: Group #:  Secondary Insurance: 1D#: Group #:  SCHEDULE INFORMATION: Date:  Time: Location:

## 2020-08-02 ENCOUNTER — Other Ambulatory Visit
Admission: RE | Admit: 2020-08-02 | Discharge: 2020-08-02 | Disposition: A | Discharge status: Home / Self Care | Payer: Medicare Other | Source: Ambulatory Visit | Attending: Gastroenterology | Admitting: Gastroenterology

## 2020-08-02 ENCOUNTER — Other Ambulatory Visit: Payer: Self-pay

## 2020-08-02 DIAGNOSIS — Z20822 Contact with and (suspected) exposure to covid-19: Secondary | ICD-10-CM | POA: Diagnosis not present

## 2020-08-02 DIAGNOSIS — Z01812 Encounter for preprocedural laboratory examination: Secondary | ICD-10-CM | POA: Diagnosis present

## 2020-08-03 LAB — SARS CORONAVIRUS 2 (TAT 6-24 HRS): SARS Coronavirus 2: NEGATIVE

## 2020-08-06 ENCOUNTER — Ambulatory Visit
Admission: RE | Admit: 2020-08-06 | Discharge: 2020-08-06 | Disposition: A | Discharge status: Home / Self Care | Payer: Medicare Other | Attending: Gastroenterology | Admitting: Gastroenterology

## 2020-08-06 ENCOUNTER — Ambulatory Visit: Payer: Medicare Other | Admitting: Certified Registered Nurse Anesthetist

## 2020-08-06 ENCOUNTER — Other Ambulatory Visit: Payer: Self-pay

## 2020-08-06 ENCOUNTER — Encounter
Admission: RE | Disposition: A | Discharge status: Home / Self Care | Payer: Self-pay | Source: Home / Self Care | Attending: Gastroenterology

## 2020-08-06 ENCOUNTER — Encounter: Payer: Self-pay | Admitting: Gastroenterology

## 2020-08-06 DIAGNOSIS — K635 Polyp of colon: Secondary | ICD-10-CM

## 2020-08-06 DIAGNOSIS — Z79899 Other long term (current) drug therapy: Secondary | ICD-10-CM | POA: Insufficient documentation

## 2020-08-06 DIAGNOSIS — Z886 Allergy status to analgesic agent status: Secondary | ICD-10-CM | POA: Insufficient documentation

## 2020-08-06 DIAGNOSIS — Z860100 Personal history of colon polyps, unspecified: Secondary | ICD-10-CM

## 2020-08-06 DIAGNOSIS — Z8349 Family history of other endocrine, nutritional and metabolic diseases: Secondary | ICD-10-CM | POA: Insufficient documentation

## 2020-08-06 DIAGNOSIS — Z800 Family history of malignant neoplasm of digestive organs: Secondary | ICD-10-CM | POA: Diagnosis not present

## 2020-08-06 DIAGNOSIS — Z791 Long term (current) use of non-steroidal anti-inflammatories (NSAID): Secondary | ICD-10-CM | POA: Insufficient documentation

## 2020-08-06 DIAGNOSIS — D120 Benign neoplasm of cecum: Secondary | ICD-10-CM | POA: Diagnosis not present

## 2020-08-06 DIAGNOSIS — Z1211 Encounter for screening for malignant neoplasm of colon: Secondary | ICD-10-CM | POA: Diagnosis not present

## 2020-08-06 DIAGNOSIS — Z881 Allergy status to other antibiotic agents status: Secondary | ICD-10-CM | POA: Insufficient documentation

## 2020-08-06 DIAGNOSIS — F17290 Nicotine dependence, other tobacco product, uncomplicated: Secondary | ICD-10-CM | POA: Diagnosis not present

## 2020-08-06 HISTORY — PX: COLONOSCOPY WITH PROPOFOL: SHX5780

## 2020-08-06 SURGERY — COLONOSCOPY WITH PROPOFOL
Anesthesia: General

## 2020-08-06 MED ORDER — LIDOCAINE HCL (CARDIAC) PF 100 MG/5ML IV SOSY
PREFILLED_SYRINGE | INTRAVENOUS | Status: DC | PRN
  Administered 2020-08-06: 50 mg via INTRAVENOUS

## 2020-08-06 MED ORDER — PROPOFOL 10 MG/ML IV BOLUS
INTRAVENOUS | Status: DC | PRN
  Administered 2020-08-06: 20 mg via INTRAVENOUS
  Administered 2020-08-06: 70 mg via INTRAVENOUS
  Administered 2020-08-06: 30 mg via INTRAVENOUS
  Administered 2020-08-06: 20 mg via INTRAVENOUS

## 2020-08-06 MED ORDER — DEXMEDETOMIDINE (PRECEDEX) IN NS 20 MCG/5ML (4 MCG/ML) IV SYRINGE
PREFILLED_SYRINGE | INTRAVENOUS | Status: AC
  Filled 2020-08-06: qty 5

## 2020-08-06 MED ORDER — SODIUM CHLORIDE 0.9 % IV SOLN: INTRAVENOUS | Status: DC

## 2020-08-06 MED ORDER — PROPOFOL 500 MG/50ML IV EMUL
INTRAVENOUS | Status: DC | PRN
  Administered 2020-08-06: 200 ug/kg/min via INTRAVENOUS

## 2020-08-06 MED ORDER — PROPOFOL 500 MG/50ML IV EMUL
INTRAVENOUS | Status: AC
  Filled 2020-08-06: qty 50

## 2020-08-06 MED ORDER — DEXMEDETOMIDINE HCL 200 MCG/2ML IV SOLN
INTRAVENOUS | Status: DC | PRN
  Administered 2020-08-06 (×2): 8 ug via INTRAVENOUS

## 2020-08-06 MED ORDER — MIDAZOLAM HCL 2 MG/2ML IJ SOLN
INTRAMUSCULAR | Status: DC | PRN
  Administered 2020-08-06: 2 mg via INTRAVENOUS

## 2020-08-06 MED ORDER — MIDAZOLAM HCL 2 MG/2ML IJ SOLN
INTRAMUSCULAR | Status: AC
  Filled 2020-08-06: qty 2

## 2020-08-06 NOTE — H&P (Signed)
Jack Darby, MD [address]  [place]  [place], [place] [address]  Main: [phone]  Fax: [phone] Pager: [phone]  Primary Care Physician:  Theotis Burrow, MD Primary Gastroenterologist:  Dr. Cephas Blanchard  Pre-Procedure History & Physical: HPI:  Jack Blanchard is a 70 y.o. male is here for an colonoscopy.   Past Medical History:  Diagnosis Date  . Asthma   . Back pain   . Fibromyalgia   . History of degenerative disc disease     Past Surgical History:  Procedure Laterality Date  . BACK SURGERY     lumbar surgery with rods placed  . COLONOSCOPY WITH PROPOFOL N/A 08/03/2017   Procedure: COLONOSCOPY WITH PROPOFOL;  Surgeon: Lin Landsman, MD;  Location: [facility] ENDOSCOPY;  Service: Gastroenterology;  Laterality: N/A;  . JOINT REPLACEMENT Bilateral   . LEG SURGERY    . TOTAL KNEE ARTHROPLASTY Right     Prior to Admission medications   Medication Sig Start Date End Date Taking? Authorizing Provider  albuterol (PROVENTIL) (2.5 MG/3ML) 0.083% nebulizer solution Take 2.5 mg by nebulization every 4 (four) hours as needed for wheezing or shortness of breath.   Yes [provider]  albuterol (VENTOLIN HFA) 108 (90 Base) MCG/ACT inhaler Inhale 2 puffs into the lungs every 4 (four) hours as needed for wheezing or shortness of breath.  12/28/18  Yes [provider]  atorvastatin (LIPITOR) 20 MG tablet Take 20 mg by mouth at bedtime. 04/03/19  Yes [provider]  baclofen (LIORESAL) 10 MG tablet Take 1-2 tablets (10-20 mg total) by mouth 3 (three) times daily for 30 days. Patient taking differently: Take 10 mg by mouth 3 (three) times daily. 10/12/18 05/30/28 Yes Milinda Pointer, MD  celecoxib (CELEBREX) 200 MG capsule Take 1 capsule (200 mg total) by mouth 2 (two) times daily for 30 days. Patient taking differently: Take 400 mg by mouth 2 (two) times daily. 10/12/18 05/30/28 Yes Milinda Pointer, MD  FLOMAX 0.4 MG CAPS capsule  Take 0.4 mg by mouth daily.  11/03/18  Yes [provider]  gabapentin (NEURONTIN) 300 MG capsule Start by taking 1 cap (300 mg) PO HS. Increase by 1 cap every 7 days until taking 3 cap (900 mg) PO HS. Patient taking differently: Take 600 mg by mouth at bedtime. 11/07/18 05/30/28 Yes Milinda Pointer, MD  CALCIUM PO Take 1 tablet by mouth daily. Patient not taking: Reported on 08/06/2020    [provider]  Cyanocobalamin (B-12 PO) Take 1 tablet by mouth daily. Patient not taking: Reported on 08/06/2020    [provider]  MAGNESIUM PO Take 1 tablet by mouth daily. Patient not taking: Reported on 08/06/2020    [provider]  OVER THE COUNTER MEDICATION Apply 1 application topically daily as needed (pain). CBD cream Patient not taking: Reported on 08/06/2020    [provider]    Allergies as of 07/11/2020 - Review Complete 05/31/2019  Allergen Reaction Noted  . Vancomycin Itching and Swelling 08/03/2017  . Acetaminophen Palpitations 04/24/2016    Family History  Problem Relation Age of Onset  . Cancer Mother   . Hyperlipidemia Son   . Cancer Maternal Uncle   . Cancer Maternal Grandfather     Social History   Socioeconomic History  . Marital status: Divorced    Spouse name: Not on file  . Number of children: Not on file  . Years of education: Not on file  . Highest education level: Not on  file  Occupational History  . Not on file  Tobacco Use  . Smoking status: Current Some Day Smoker    Types: Cigars  . Smokeless tobacco: Never Used  Vaping Use  . Vaping Use: Never used  Substance and Sexual Activity  . Alcohol use: Yes    Comment: 1/5 a week  . Drug use: Yes    Types: Marijuana    Comment: 08/05/20  . Sexual activity: Not on file  Other Topics Concern  . Not on file  Social History Narrative  . Not on file   Social Determinants of Health   Financial Resource Strain: Not on file  Food Insecurity: Not on file   Transportation Needs: Not on file  Physical Activity: Not on file  Stress: Not on file  Social Connections: Not on file  Intimate Partner Violence: Not on file    Review of Systems: See HPI, otherwise negative ROS  Physical Exam: BP (!) 178/81   Pulse 63   Temp (!) 95.8 F (35.4 C) (Temporal)   Resp 20   Ht 5\' 11"  (1.803 m)   Wt 127 kg   SpO2 100%   BMI 39.05 kg/m  General:   Alert,  pleasant and cooperative in NAD Head:  Normocephalic and atraumatic. Neck:  Supple; no masses or thyromegaly. Lungs:  Clear throughout to auscultation.    Heart:  Regular rate and rhythm. Abdomen:  Soft, nontender and nondistended. Normal bowel sounds, without guarding, and without rebound.   Neurologic:  Alert and  oriented x4;  grossly normal neurologically.  Impression/Plan: Jack Blanchard is here for an colonoscopy to be performed for h/o colon polyps  Risks, benefits, limitations, and alternatives regarding  colonoscopy have been reviewed with the patient.  Questions have been answered.  All parties agreeable.   Sherri Sear, MD  08/06/2020, 10:39 AM

## 2020-08-06 NOTE — Transfer of Care (Signed)
Immediate Anesthesia Transfer of Care Note  Patient: Jack Blanchard  Procedure(s) Performed: COLONOSCOPY WITH PROPOFOL (N/A )  Patient Location: Endoscopy Unit  Anesthesia Type:General  Level of Consciousness: drowsy  Airway & Oxygen Therapy: Patient Spontanous Breathing  Post-op Assessment: Report given to RN and Post -op Vital signs reviewed and stable  Post vital signs: Reviewed and stable  Last Vitals:  Vitals Value Taken Time  BP 133/81 08/06/20 1133  Temp 35.4 C 08/06/20 1130  Pulse 81 08/06/20 1133  Resp 12 08/06/20 1133  SpO2 99 % 08/06/20 1133  Vitals shown include unvalidated device data.  Last Pain:  Vitals:   08/06/20 1130  TempSrc: Temporal  PainSc:          Complications: No complications documented.

## 2020-08-06 NOTE — Anesthesia Preprocedure Evaluation (Signed)
Anesthesia Evaluation  Patient identified by MRN, date of birth, ID band Patient awake    Reviewed: Allergy & Precautions, H&P , NPO status , Patient's Chart, lab work & pertinent test results  History of Anesthesia Complications Negative for: history of anesthetic complications  Airway Mallampati: III  TM Distance: >3 FB     Dental  (+) Chipped   Pulmonary asthma , neg sleep apnea, neg COPD, Current Smoker,    breath sounds clear to auscultation       Cardiovascular (-) angina(-) Past MI and (-) Cardiac Stents negative cardio ROS  (-) dysrhythmias  Rhythm:regular Rate:Normal     Neuro/Psych Chronic pain negative neurological ROS  negative psych ROS   GI/Hepatic negative GI ROS, Neg liver ROS,   Endo/Other    Renal/GU negative Renal ROS  negative genitourinary   Musculoskeletal   Abdominal   Peds  Hematology negative hematology ROS (+)   Anesthesia Other Findings Obesity  Past Medical History: No date: Asthma No date: Back pain No date: Fibromyalgia No date: History of degenerative disc disease  Past Surgical History: No date: BACK SURGERY     Comment:  lumbar surgery with rods placed 08/03/2017: COLONOSCOPY WITH PROPOFOL; N/A     Comment:  Procedure: COLONOSCOPY WITH PROPOFOL;  Surgeon: Lin Landsman, MD;  Location: [facility] ENDOSCOPY;  Service:               Gastroenterology;  Laterality: N/A; No date: JOINT REPLACEMENT; Bilateral No date: LEG SURGERY No date: TOTAL KNEE ARTHROPLASTY; Right  BMI    Body Mass Index: 39.05 kg/m      Reproductive/Obstetrics negative OB ROS                             Anesthesia Physical Anesthesia Plan  ASA: II  Anesthesia Plan: General   Post-op Pain Management:    Induction:   PONV Risk Score and Plan: Propofol infusion and TIVA  Airway Management Planned:   Additional Equipment:   Intra-op Plan:    Post-operative Plan:   Informed Consent: I have reviewed the patients History and Physical, chart, labs and discussed the procedure including the risks, benefits and alternatives for the proposed anesthesia with the patient or authorized representative who has indicated his/her understanding and acceptance.     Dental Advisory Given  Plan Discussed with: Anesthesiologist, CRNA and Surgeon  Anesthesia Plan Comments:         Anesthesia Quick Evaluation

## 2020-08-06 NOTE — Op Note (Signed)
[name] Procedure Date: 08/06/2020 10:41 AM MRN: [id number] Account #: [id number] Date of Birth: December 28, 1950 Admit Type: Outpatient Age: 70 Room: [facility] ENDO ROOM 3 Gender: Male Note Status: Finalized Procedure:             Colonoscopy Indications:           Surveillance: Personal history of adenomatous polyps                         on last colonoscopy 3 years ago, High risk colon                         cancer surveillance: Personal history of multiple (3                         or more) adenomas, Last colonoscopy: March 2019 Providers:             Lin Landsman MD, MD Referring MD:          Elyse Jarvis Revelo (Referring MD) Medicines:             General Anesthesia Complications:         No immediate complications. Estimated blood loss: None. Procedure:             Pre-Anesthesia Assessment:                        - Prior to the procedure, a History and Physical was                         performed, and patient medications and allergies were                         reviewed. The patient is competent. The risks and                         benefits of the procedure and the sedation options and                         risks were discussed with the patient. All questions                         were answered and informed consent was obtained.                         Patient identification and proposed procedure were                         verified by the physician, the nurse, the                         anesthesiologist, the anesthetist and the technician                         in the pre-procedure area in the procedure room in the                         endoscopy suite. Mental Status Examination: alert and  oriented. Airway Examination: normal oropharyngeal                         airway and neck mobility. Respiratory Examination:                         clear to auscultation. CV  Examination: normal.                         Prophylactic Antibiotics: The patient does not require                         prophylactic antibiotics. Prior Anticoagulants: The                         patient has taken no previous anticoagulant or                         antiplatelet agents. ASA Grade Assessment: III - A                         patient with severe systemic disease. After reviewing                         the risks and benefits, the patient was deemed in                         satisfactory condition to undergo the procedure. The                         anesthesia plan was to use general anesthesia.                         Immediately prior to administration of medications,                         the patient was re-assessed for adequacy to receive                         sedatives. The heart rate, respiratory rate, oxygen                         saturations, blood pressure, adequacy of pulmonary                         ventilation, and response to care were monitored                         throughout the procedure. The physical status of the                         patient was re-assessed after the procedure.                        After obtaining informed consent, the colonoscope was                         passed under direct vision. Throughout the procedure,  the patient's blood pressure, pulse, and oxygen                         saturations were monitored continuously. The                         Colonoscope was introduced through the anus and                         advanced to the the cecum, identified by appendiceal                         orifice and ileocecal valve. The colonoscopy was                         unusually difficult due to significant looping and the                         patient's body habitus and abdominal breathing. The                         quality of the bowel preparation was adequate. Findings:      The perianal and  digital rectal examinations were normal. Pertinent       negatives include normal sphincter tone and no palpable rectal lesions.      A diminutive polyp was found in the ileocecal valve. The polyp was       sessile. The polyp was removed with a cold biopsy forceps. Resection and       retrieval were complete.      The retroflexed view of the distal rectum and anal verge was normal and       showed no anal or rectal abnormalities.      The exam was otherwise without abnormality. Impression:            - One diminutive polyp at the ileocecal valve, removed                         with a cold biopsy forceps. Resected and retrieved.                        - The distal rectum and anal verge are normal on                         retroflexion view.                        - The examination was otherwise normal. Recommendation:        - Discharge patient to home (with escort).                        - Resume previous diet today.                        - Continue present medications.                        - Await pathology results.                        -  Repeat colonoscopy in 5 years for surveillance. Procedure Code(s):     --- Professional ---                        [phone], Colonoscopy, flexible; with biopsy, single or                         multiple Diagnosis Code(s):     --- Professional ---                        K63.5, Polyp of colon                        Z86.010, Personal history of colonic polyps CPT copyright 2019 [employer]. All rights reserved. The codes documented in this report are preliminary and upon coder review may  be revised to meet current compliance requirements. Dr. Ulyess Mort Lin Landsman MD, MD 08/06/2020 11:29:54 AM This report has been signed electronically. Number of Addenda: 0 Note Initiated On: 08/06/2020 10:41 AM Scope Withdrawal Time: 0 hours 18 minutes 10 seconds  Total Procedure Duration: 0 hours 24 minutes 30 seconds  Estimated  Blood Loss:  Estimated blood loss: none.      [facility]

## 2020-08-06 NOTE — Anesthesia Postprocedure Evaluation (Signed)
Anesthesia Post Note  Patient: Jack Blanchard  Procedure(s) Performed: COLONOSCOPY WITH PROPOFOL (N/A )  Patient location during evaluation: PACU Anesthesia Type: General Level of consciousness: awake and alert Pain management: pain level controlled Vital Signs Assessment: post-procedure vital signs reviewed and stable Respiratory status: spontaneous breathing, nonlabored ventilation and respiratory function stable Cardiovascular status: blood pressure returned to baseline and stable Postop Assessment: no apparent nausea or vomiting Anesthetic complications: no   No complications documented.   Last Vitals:  Vitals:   08/06/20 1140 08/06/20 1150  BP: 133/81 (!) 151/86  Pulse: 72 68  Resp: 15 19  Temp:    SpO2: 99% 100%    Last Pain:  Vitals:   08/06/20 1130  TempSrc: Temporal  PainSc:                  Tera Mater

## 2020-08-07 ENCOUNTER — Encounter: Payer: Self-pay | Admitting: Gastroenterology

## 2020-08-07 LAB — SURGICAL PATHOLOGY

## 2020-09-03 ENCOUNTER — Encounter: Payer: Self-pay | Admitting: Student in an Organized Health Care Education/Training Program

## 2020-09-03 ENCOUNTER — Ambulatory Visit
Payer: Medicare Other | Attending: Student in an Organized Health Care Education/Training Program | Admitting: Student in an Organized Health Care Education/Training Program

## 2020-09-03 ENCOUNTER — Other Ambulatory Visit: Payer: Self-pay

## 2020-09-03 VITALS — BP 138/79 | HR 74 | Temp 97.2°F | Resp 16 | Ht 71.0 in | Wt 295.0 lb

## 2020-09-03 DIAGNOSIS — Z96653 Presence of artificial knee joint, bilateral: Secondary | ICD-10-CM | POA: Diagnosis present

## 2020-09-03 DIAGNOSIS — G894 Chronic pain syndrome: Secondary | ICD-10-CM | POA: Diagnosis present

## 2020-09-03 DIAGNOSIS — M47816 Spondylosis without myelopathy or radiculopathy, lumbar region: Secondary | ICD-10-CM | POA: Diagnosis present

## 2020-09-03 DIAGNOSIS — M4310 Spondylolisthesis, site unspecified: Secondary | ICD-10-CM | POA: Diagnosis present

## 2020-09-03 DIAGNOSIS — M170 Bilateral primary osteoarthritis of knee: Secondary | ICD-10-CM | POA: Diagnosis present

## 2020-09-03 DIAGNOSIS — M5416 Radiculopathy, lumbar region: Secondary | ICD-10-CM | POA: Diagnosis not present

## 2020-09-03 DIAGNOSIS — M4697 Unspecified inflammatory spondylopathy, lumbosacral region: Secondary | ICD-10-CM | POA: Diagnosis not present

## 2020-09-03 DIAGNOSIS — M5442 Lumbago with sciatica, left side: Secondary | ICD-10-CM | POA: Insufficient documentation

## 2020-09-03 DIAGNOSIS — M961 Postlaminectomy syndrome, not elsewhere classified: Secondary | ICD-10-CM | POA: Diagnosis not present

## 2020-09-03 DIAGNOSIS — G8929 Other chronic pain: Secondary | ICD-10-CM | POA: Insufficient documentation

## 2020-09-03 DIAGNOSIS — M5441 Lumbago with sciatica, right side: Secondary | ICD-10-CM | POA: Insufficient documentation

## 2020-09-03 NOTE — Progress Notes (Signed)
Safety precautions to be maintained throughout the outpatient stay will include: orient to surroundings, keep bed in low position, maintain call bell within reach at all times, provide assistance with transfer out of bed and ambulation.  

## 2020-09-03 NOTE — Progress Notes (Signed)
Patient: Jack Blanchard  Service Category: E/M  Provider: Gillis Santa, MD  DOB: [birth year]  DOS: 09/03/2020  Referring Provider: Lovell Sheehan, MD  MRN: [id number]  Setting: Ambulatory outpatient  PCP: Theotis Burrow, MD  Type: New Patient  Specialty: Interventional Pain Management    Location: Office  Delivery: Face-to-face     Primary Reason(s) for Visit: Encounter for initial evaluation of one or more chronic problems (new to examiner) potentially causing chronic pain, and posing a threat to normal musculoskeletal function. (Level of risk: High) CC: Knee Pain (Bilateral s/p joint replacements 2021) and Back Pain (Lumbar bilateral s/p surgery with hardware )  HPI  Jack Blanchard is a 70 y.o. year old, male patient, who comes for the first time to our practice referred by Lovell Sheehan, MD for our initial evaluation of his chronic pain. He has Special screening for malignant neoplasms, colon; Chronic midline low back pain with right-sided sciatica; Bilateral primary osteoarthritis of knee; Chronic pain syndrome; Pharmacologic therapy; Disorder of skeletal system; Problems influencing health status; Chronic low back pain (Secondary Area of Pain) (Bilateral) (R>L) w/ sciatica (Bilateral); Pain in limb; Chronic knee pain [facility] Area of Pain) (Right); Vitamin D insufficiency; Marijuana use; DDD (degenerative disc disease), lumbosacral; Lumbar facet arthropathy; Lumbar facet syndrome (Bilateral) (R>L); Failed back surgical syndrome; Osteoarthritis of facet joint of lumbar spine; Grade 1 Anterolisthesis L4/L5; Lumbar facet hypertrophy; Osteoarthritis of hip (Right); Tricompartment osteoarthritis of knees (Bilateral); Osteoarthritis of patellofemoral joints of knees (Bilateral); Neurogenic pain; Burning sensation of lower extremity; Peripheral neuropathy; Chronic musculoskeletal pain; Fibromyalgia; Inflammatory spondylopathy of lumbosacral region [facility]); Preop testing; Tobacco use disorder; Hx of colonic  polyps; Chronic radicular lumbar pain; and History of bilateral knee replacement on their problem list. Today he comes in for evaluation of his Knee Pain (Bilateral s/p joint replacements 2021) and Back Pain (Lumbar bilateral s/p surgery with hardware )  Pain Assessment: Location: Lower,Left,Right Knee (back) Radiating: knee pain goes down to ankles and back pain goes down into hips and legs Onset: More than a month ago Duration: Chronic pain Quality: Burning,Sharp,Numbness Severity: 4 /10 (subjective, self-reported pain score)  Effect on ADL: pain is limiting, creates instability especially after joint replacement. Timing: Constant Modifying factors: marijuana relaxes him and helps manage the pain. BP: 138/79  HR: 74  Onset and Duration: Gradual and Date of onset: 1994 Cause of pain: Unknown Severity: Getting worse, NAS-11 at its worse: 10/10, NAS-11 at its best: 7/10, NAS-11 now: 6/10 and NAS-11 on the average: 7/10 Timing: Morning, Night and During activity or exercise Aggravating Factors: Bending, Lifiting, Prolonged sitting, Prolonged standing, Walking, Walking uphill and Walking downhill Alleviating Factors: Cold packs, Hot packs, Lying down, Medications, Resting, Sitting, Sleeping, Standing and Warm showers or baths Associated Problems: Day-time cramps, Inability to control bladder (urine), Numbness, Sadness, Spasms, Swelling, Tingling, Weakness, Pain that wakes patient up and Pain that does not allow patient to sleep Quality of Pain: Aching, Agonizing, Annoying, Burning, Constant, Cramping, Deep, Disabling, Distressing, Exhausting, Hot, Itching, Sharp, Shooting, Stabbing, Throbbing, Tingling and Uncomfortable Previous Examinations or Tests: CT scan, MRI scan and Orthopedic evaluation Previous Treatments: Epidural steroid injections, Narcotic medications and Steroid treatments by mouth   Jack Blanchard is a pleasant 70 year old male who presents with low back pain that radiates to his  bilateral legs, left greater than right in a dermatomal fashion as well as bilateral knee pain.  Patient has a history of extensive lumbar spine surgery with multilevel lumbar spinal fusion.  Patient also has  a history of bilateral knee replacement.  He was seen at this pain clinic by my colleague, Dr. Dossie Arbour almost 2 years ago for a caudal epidural steroid injection as well as lumbar facet medial branch nerve blocks which were effective for his sciatica pain and low back pain respectively.  He states that he has been having an increase in his sciatica pain and is interested in repeating a caudal injection that he had previously.  For his bilateral knee pain that has been refractory to medication management, physical therapy, heat, we discussed genicular nerve block, diagnostic and possible radiofrequency ablation thereafter.  Risks and benefits of this were reviewed in detail.  Meds   Current Outpatient Medications:  .  albuterol (PROVENTIL) (2.5 MG/3ML) 0.083% nebulizer solution, Take 2.5 mg by nebulization every 4 (four) hours as needed for wheezing or shortness of breath., Disp: , Rfl:  .  albuterol (VENTOLIN HFA) 108 (90 Base) MCG/ACT inhaler, Inhale 2 puffs into the lungs every 4 (four) hours as needed for wheezing or shortness of breath. , Disp: , Rfl:  .  atorvastatin (LIPITOR) 20 MG tablet, Take 20 mg by mouth at bedtime., Disp: , Rfl:  .  baclofen (LIORESAL) 10 MG tablet, Take 1-2 tablets (10-20 mg total) by mouth 3 (three) times daily for 30 days. (Patient taking differently: Take 10 mg by mouth 3 (three) times daily.), Disp: 180 tablet, Rfl: 0 .  CALCIUM PO, Take 1 tablet by mouth daily., Disp: , Rfl:  .  celecoxib (CELEBREX) 200 MG capsule, Take 1 capsule (200 mg total) by mouth 2 (two) times daily for 30 days. (Patient taking differently: Take 400 mg by mouth 2 (two) times daily.), Disp: 60 capsule, Rfl: 0 .  Cyanocobalamin (B-12 PO), Take 1 tablet by mouth daily., Disp: , Rfl:  .   DULoxetine (CYMBALTA) 60 MG capsule, Take 60 mg by mouth daily., Disp: , Rfl:  .  FLOMAX 0.4 MG CAPS capsule, Take 0.4 mg by mouth daily. , Disp: , Rfl:  .  furosemide (LASIX) 20 MG tablet, Take 20 mg by mouth daily., Disp: , Rfl:  .  gabapentin (NEURONTIN) 300 MG capsule, Start by taking 1 cap (300 mg) PO HS. Increase by 1 cap every 7 days until taking 3 cap (900 mg) PO HS. (Patient taking differently: Take 600 mg by mouth at bedtime.), Disp: 90 capsule, Rfl: 0 .  MAGNESIUM PO, Take 1 tablet by mouth daily., Disp: , Rfl:  .  methocarbamol (ROBAXIN) 750 MG tablet, Take 750 mg by mouth at bedtime., Disp: , Rfl:  .  OVER THE COUNTER MEDICATION, Apply 1 application topically daily as needed (pain). CBD cream, Disp: , Rfl:   Imaging Review  Lumbosacral Imaging: Lumbar MR wo contrast: Results for orders placed during the hospital encounter of 09/24/17  MR LUMBAR SPINE WO CONTRAST  Narrative CLINICAL DATA:  Low back pain and bilateral leg pain right worse than left. Patient was unable to tolerate complete imaging because of pain. All images suffer from motion degradation.  EXAM: MRI LUMBAR SPINE WITHOUT CONTRAST  TECHNIQUE: Multiplanar, multisequence MR imaging of the lumbar spine was performed. No intravenous contrast was administered.  COMPARISON:  Radiography 03/09/2016  FINDINGS: Segmentation: S1 has some transitional features. Same numbering terminology as used previously.  Alignment:  Within normal limits.  Vertebrae: Distant PLIF L5-S1. No fracture or other focal bone finding.  Conus medullaris and cauda equina: Conus extends to the L1-2 level. Conus and cauda equina appear normal.  Paraspinal and  other soft tissues: Negative  Disc levels:  T12-L1: Normal.  L1-2: Bulging of the disc.  No likely compressive stenosis.  L2-3: Bulging of the disc. Mild facet hypertrophy. Mild canal stenosis.  L3-4: Bulging of the disc. Facet and ligamentous hypertrophy. Mild canal  stenosis.  L4-5: Mild bulging of the disc. Facet and ligamentous hypertrophy. Mild lateral recess and foraminal stenosis left worse than right.  L5-S1: Previous PLIF. Wide patency of the canal and foramina. No complicating feature seen.  IMPRESSION: Motion degraded and abbreviated exam because of patient pain.  Satisfactory appearance at the previous decompression and fusion level of L5-S1.  Disc bulges and facet hypertrophy at L1-2, L2-3 and L3-4. Findings at these levels could certainly be associated with back pain. Distinct neural compression is not demonstrated however. Some potential for stenotic symptoms.  L4-5 facet degeneration and mild bulging of the disc. Mild narrowing of the lateral recesses and foramina left more than right.   Electronically Signed By: Nelson Chimes M.D. On: 09/25/2017 07:50  Narrative CLINICAL DATA:  Back pain for 7 months, increased today  EXAM: LUMBAR SPINE - 2-3 VIEW  COMPARISON:  None.  FINDINGS: L5-S1 solid fusion.  Rod and pedicle screws are unremarkable.  Degenerative disc disease with diffuse narrowing, focally advanced at L2-3 and L3-4. Lumbar facet arthropathy with spurring greatest at L4-5, where there is slight anterolisthesis. No evidence of fracture or focal bone lesion. Right hip osteoarthritis with joint narrowing, spurring, and subchondral cysts.  IMPRESSION: 1. No acute finding. 2. Diffuse lumbar disc and facet degeneration as described. 3. L5-S1 solid bony fusion.   Electronically Signed By: Monte Fantasia M.D. On: 03/09/2016 13:58  Narrative CLINICAL DATA:  Pt reports increasing left hip pain over the past three days. Pt says that his ankle has been swollen and his toes feel numb. Denies any injury, able to bear weight but states that he cannot do it for an extended period of time  EXAM: DG HIP (WITH OR WITHOUT PELVIS) 2-3V LEFT  COMPARISON:  03/09/2016  FINDINGS: There is no evidence of hip fracture  or dislocation. There is no evidence of arthropathy or other focal bone abnormality. Lower lumbar fixation hardware noted.  IMPRESSION: Negative.   Electronically Signed By: Lucrezia Europe M.D. On: 09/03/2017 13:57 Narrative CLINICAL DATA:  Bilateral knee pain for many years, worse recently, no injury  EXAM: RIGHT KNEE - COMPLETE 4+ VIEW  COMPARISON:  None.  FINDINGS: There is tricompartmental degenerative joint disease of the right knee with some spurring from each compartment appear however primary loss of joint space involves the patellofemoral compartment with almost complete loss of joint space and spurring. No acute fracture is seen and no joint effusion is noted. The patella is slightly laterally positioned.  IMPRESSION: 1. 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 2. No fracture or effusion. 3. The patella is laterally positioned.   Electronically Signed By: Ivar Drape M.D. On: 08/10/2017 17:25  Knee-L DG 4 views: Results for orders placed during the hospital encounter of 08/10/17  DG Knee Complete 4 Views Left  Narrative CLINICAL DATA:  Bilateral knee pain for over 10 years, worse recently, no injury  EXAM: LEFT KNEE - COMPLETE 4+ VIEW  COMPARISON:  None.  FINDINGS: There is tricompartmental degenerative joint disease of the left knee. There is spurring from each compartment, but the primary loss of joint space involves the patellofemoral compartment with almost complete loss of joint space and spurring. No  fracture is seen and no joint effusion is noted. However there is a loose body in the posterior joint space.  IMPRESSION: 1. Tricompartmental degenerative joint disease of the left knee primarily involving the patellofemoral compartment. 2. No fracture or effusion. 3. Loose bodies in the joint space.   Electronically Signed By: Ivar Drape M.D. On: 08/10/2017  17:24  Complexity Note: Imaging results reviewed. Results shared with Jack Blanchard, using Layman's terms.                         ROS  Cardiovascular: No reported cardiovascular signs or symptoms such as High blood pressure, coronary artery disease, abnormal heart rate or rhythm, heart attack, blood thinner therapy or heart weakness and/or failure Pulmonary or Respiratory: Lung problems, Wheezing and difficulty taking a deep full breath (Asthma), Shortness of breath, Smoking, Snoring  and Coughing up mucus (Bronchitis) Neurological: Incontinence:  Urinary Psychological-Psychiatric: Anxiousness and Difficulty sleeping and or falling asleep Gastrointestinal: No reported gastrointestinal signs or symptoms such as vomiting or evacuating blood, reflux, heartburn, alternating episodes of diarrhea and constipation, inflamed or scarred liver, or pancreas or irrregular and/or infrequent bowel movements Genitourinary: No reported renal or genitourinary signs or symptoms such as difficulty voiding or producing urine, peeing blood, non-functioning kidney, kidney stones, difficulty emptying the bladder, difficulty controlling the flow of urine, or chronic kidney disease Hematological: Brusing easily and Bleeding easily Endocrine: No reported endocrine signs or symptoms such as high or low blood sugar, rapid heart rate due to high thyroid levels, obesity or weight gain due to slow thyroid or thyroid disease Rheumatologic: No reported rheumatological signs and symptoms such as fatigue, joint pain, tenderness, swelling, redness, heat, stiffness, decreased range of motion, with or without associated rash Musculoskeletal: Negative for myasthenia gravis, muscular dystrophy, multiple sclerosis or malignant hyperthermia Work History: Unemployed  Allergies  Jack Blanchard is allergic to vancomycin and acetaminophen.  Laboratory Chemistry Profile   Renal Lab Results  Component Value Date   BUN 11 08/01/2018   CREATININE  0.88 08/01/2018   BCR 13 08/01/2018   GFRAA 103 08/01/2018   GFRNONAA 90 08/01/2018     Electrolytes Lab Results  Component Value Date   NA 144 08/01/2018   K 4.0 08/01/2018   CL 104 08/01/2018   CALCIUM 8.9 08/01/2018   MG 1.8 08/01/2018     Hepatic Lab Results  Component Value Date   AST 18 08/01/2018   ALBUMIN 4.4 08/01/2018   ALKPHOS 78 08/01/2018     ID Lab Results  Component Value Date   SARSCOV2NAA NEGATIVE 08/02/2020     Bone Lab Results  Component Value Date   25OHVITD1 28 (L) 08/01/2018   25OHVITD2 <1.0 08/01/2018   25OHVITD3 28 08/01/2018     Endocrine Lab Results  Component Value Date   GLUCOSE 75 08/01/2018     Neuropathy Lab Results  Component Value Date   VITAMINB12 250 08/01/2018     CNS No results found for: COLORCSF, APPEARCSF, RBCCOUNTCSF, WBCCSF, POLYSCSF, LYMPHSCSF, EOSCSF, PROTEINCSF, GLUCCSF, JCVIRUS, CSFOLI, IGGCSF, LABACHR, ACETBL, LABACHR, ACETBL   Inflammation (CRP: Acute  ESR: Chronic) Lab Results  Component Value Date   CRP 2 08/01/2018   ESRSEDRATE 14 08/01/2018     Rheumatology Lab Results  Component Value Date   LABURIC 6.2 09/03/2017     Coagulation Lab Results  Component Value Date   PLT 192 09/03/2017     Cardiovascular Lab Results  Component Value Date   HGB  14.5 09/03/2017   HCT 42.4 09/03/2017     Screening Lab Results  Component Value Date   SARSCOV2NAA NEGATIVE 08/02/2020   COVIDSOURCE NASOPHARYNGEAL 11/11/2018     Cancer No results found for: CEA, CA125, LABCA2   Allergens No results found for: ALMOND, APPLE, ASPARAGUS, AVOCADO, BANANA, BARLEY, BASIL, BAYLEAF, GREENBEAN, LIMABEAN, WHITEBEAN, BEEFIGE, REDBEET, BLUEBERRY, BROCCOLI, CABBAGE, MELON, CARROT, CASEIN, CASHEWNUT, CAULIFLOWER, CELERY     Note: Lab results reviewed.  [place]  Drug: Jack Blanchard  reports current drug use. Drug: Marijuana. Alcohol:  reports current alcohol use. Tobacco:  reports that he has been smoking cigars. He has  never used smokeless tobacco. Medical:  has a past medical history of Asthma, Back pain, Fibromyalgia, and History of degenerative disc disease. Family: family history includes Cancer in his maternal grandfather, maternal uncle, and mother; Hyperlipidemia in his son.  Past Surgical History:  Procedure Laterality Date  . BACK SURGERY     lumbar surgery with rods placed  . COLONOSCOPY WITH PROPOFOL N/A 08/03/2017   Procedure: COLONOSCOPY WITH PROPOFOL;  Surgeon: Lin Landsman, MD;  Location: [facility] ENDOSCOPY;  Service: Gastroenterology;  Laterality: N/A;  . COLONOSCOPY WITH PROPOFOL N/A 08/06/2020   Procedure: COLONOSCOPY WITH PROPOFOL;  Surgeon: Lin Landsman, MD;  Location: [facility] ENDOSCOPY;  Service: Gastroenterology;  Laterality: N/A;  . JOINT REPLACEMENT Bilateral   . LEG SURGERY    . TOTAL KNEE ARTHROPLASTY Right    Active Ambulatory Problems    Diagnosis Date Noted  . Special screening for malignant neoplasms, colon   . Chronic midline low back pain with right-sided sciatica 04/24/2016  . Bilateral primary osteoarthritis of knee 09/22/2017  . Chronic pain syndrome 08/01/2018  . Pharmacologic therapy 08/01/2018  . Disorder of skeletal system 08/01/2018  . Problems influencing health status 08/01/2018  . Chronic low back pain (Secondary Area of Pain) (Bilateral) (R>L) w/ sciatica (Bilateral) 08/01/2018  . Pain in limb 08/01/2018  . Chronic knee pain [facility] Area of Pain) (Right) 08/01/2018  . Vitamin D insufficiency 08/04/2018  . Marijuana use 08/08/2018  . DDD (degenerative disc disease), lumbosacral 08/22/2018  . Lumbar facet arthropathy 08/22/2018  . Lumbar facet syndrome (Bilateral) (R>L) 08/22/2018  . Failed back surgical syndrome 08/22/2018  . Osteoarthritis of facet joint of lumbar spine 08/22/2018  . Grade 1 Anterolisthesis L4/L5 08/22/2018  . Lumbar facet hypertrophy 08/22/2018  . Osteoarthritis of hip (Right) 08/22/2018  . Tricompartment osteoarthritis of  knees (Bilateral) 08/22/2018  . Osteoarthritis of patellofemoral joints of knees (Bilateral) 08/22/2018  . Neurogenic pain 08/22/2018  . Burning sensation of lower extremity 08/22/2018  . Peripheral neuropathy 08/22/2018  . Chronic musculoskeletal pain 08/22/2018  . Fibromyalgia 08/22/2018  . Inflammatory spondylopathy of lumbosacral region ([place]) 10/12/2018  . Preop testing 10/13/2018  . Tobacco use disorder 04/04/2019  . Hx of colonic polyps   . Chronic radicular lumbar pain 09/03/2020  . History of bilateral knee replacement 09/03/2020   Resolved Ambulatory Problems    Diagnosis Date Noted  . No Resolved Ambulatory Problems   Past Medical History:  Diagnosis Date  . Asthma   . Back pain   . History of degenerative disc disease    Constitutional Exam  General appearance: Well nourished, well developed, and well hydrated. In no apparent acute distress Vitals:   09/03/20 1004  BP: 138/79  Pulse: 74  Resp: 16  Temp: (!) 97.2 F (36.2 C)  TempSrc: Temporal  SpO2: 100%  Weight: 295 lb (133.8 kg)  Height: '5\' 11"'  (1.803  m)   BMI Assessment: Estimated body mass index is 41.14 kg/m as calculated from the following:   Height as of this encounter: '5\' 11"'  (1.803 m).   Weight as of this encounter: 295 lb (133.8 kg).  BMI interpretation table: BMI level Category Range association with higher incidence of chronic pain  <18 kg/m2 Underweight   18.5-24.9 kg/m2 Ideal body weight   25-29.9 kg/m2 Overweight Increased incidence by 20%  30-34.9 kg/m2 Obese (Class I) Increased incidence by 68%  35-39.9 kg/m2 Severe obesity (Class II) Increased incidence by 136%  >40 kg/m2 Extreme obesity (Class III) Increased incidence by 254%   Patient's current BMI Ideal Body weight  Body mass index is 41.14 kg/m. Ideal body weight: 75.3 kg (166 lb 0.1 oz) Adjusted ideal body weight: 98.7 kg (217 lb 9.7 oz)   BMI Readings from Last 4 Encounters:  09/03/20 41.14 kg/m  08/06/20 39.05 kg/m   05/31/19 42.78 kg/m  04/17/19 42.23 kg/m   Wt Readings from Last 4 Encounters:  09/03/20 295 lb (133.8 kg)  08/06/20 280 lb (127 kg)  05/31/19 (!) 306 lb 12 oz (139.1 kg)  04/17/19 (!) 302 lb 12 oz (137.3 kg)    Psych/Mental status: Alert, oriented x 3 (person, place, & time)       Eyes: PERLA Respiratory: No evidence of acute respiratory distress  Cervical Spine Exam  Skin & Axial Inspection: No masses, redness, edema, swelling, or associated skin lesions Alignment: Symmetrical Functional ROM: Unrestricted ROM      Stability: No instability detected Muscle Tone/Strength: Functionally intact. No obvious neuro-muscular anomalies detected. Sensory (Neurological): Unimpaired Palpation: No palpable anomalies              Upper Extremity (UE) Exam    Side: Right upper extremity  Side: Left upper extremity  Skin & Extremity Inspection: Skin color, temperature, and hair growth are WNL. No peripheral edema or cyanosis. No masses, redness, swelling, asymmetry, or associated skin lesions. No contractures.  Skin & Extremity Inspection: Skin color, temperature, and hair growth are WNL. No peripheral edema or cyanosis. No masses, redness, swelling, asymmetry, or associated skin lesions. No contractures.  Functional ROM: Unrestricted ROM          Functional ROM: Unrestricted ROM          Muscle Tone/Strength: Functionally intact. No obvious neuro-muscular anomalies detected.   Muscle Tone/Strength: Functionally intact. No obvious neuro-muscular anomalies detected.  Sensory (Neurological): Unimpaired          Sensory (Neurological): Unimpaired          Palpation: No palpable anomalies              Palpation: No palpable anomalies              Provocative Test(s):  Phalen's test: deferred Tinel's test: deferred Apley's scratch test (touch opposite shoulder):  Action 1 (Across chest): deferred Action 2 (Overhead): deferred Action 3 (LB reach): deferred   Provocative Test(s):  Phalen's test:  deferred Tinel's test: deferred Apley's scratch test (touch opposite shoulder):  Action 1 (Across chest): deferred Action 2 (Overhead): deferred Action 3 (LB reach): deferred    Thoracic Spine Area Exam  Skin & Axial Inspection: No masses, redness, or swelling Alignment: Symmetrical Functional ROM: Pain restricted ROM Stability: No instability detected Muscle Tone/Strength: Functionally intact. No obvious neuro-muscular anomalies detected. Sensory (Neurological): Musculoskeletal pain pattern Muscle strength & Tone: No palpable anomalies Carnett's test*: deferred (*Carnett's sign is a clinical examination finding that is useful for confirming  whether pain originates from the abdominal viscera or from the abdominal wall. During testing for Carnett's sign, the investigator identifies the point of maximal abdominal pain by deeply palpating with a finger; the patient is then asked to tense the abdominal muscles while the fingertip is released, followed again by deep palpation. If both stages of the test are painful, the source of the pain is the abdominal wall. In contrast, pain originating from the abdominal organs is associated with just a painful first stage) Lumbar Exam  Skin & Axial Inspection: Well healed scar from previous spine surgery detected Alignment: Symmetrical Functional ROM: Pain restricted ROM       Stability: No instability detected Muscle Tone/Strength: Functionally intact. No obvious neuro-muscular anomalies detected. Sensory (Neurological): Dermatomal pain pattern Palpation: No palpable anomalies       Provocative Tests: Hyperextension/rotation test: Unable to perform due to fusion restriction. Lumbar quadrant test (Kemp's test): (+) bilateral for foraminal stenosis Lateral bending test: (+) ipsilateral radicular pain, bilaterally. Positive for bilateral foraminal stenosis. Patrick's Maneuver: (+) for bilateral S-I arthralgia             FABER* test: (+) for bilateral  S-I arthralgia             S-I anterior distraction/compression test: (+)   S-I arthralgia/arthropathy  Gait & Posture Assessment  Ambulation: Limited Gait: Antalgic Posture: Difficulty standing up straight, due to pain   Lower Extremity Exam    Side: Right lower extremity  Side: Left lower extremity  Stability: No instability observed          Stability: No instability observed          Skin & Extremity Inspection: Skin color, temperature, and hair growth are WNL. No peripheral edema or cyanosis. No masses, redness, swelling, asymmetry, or associated skin lesions. No contractures.  Skin & Extremity Inspection: Skin color, temperature, and hair growth are WNL. No peripheral edema or cyanosis. No masses, redness, swelling, asymmetry, or associated skin lesions. No contractures.  Functional ROM: Pain restricted ROM for hip and knee joints          Functional ROM: Pain restricted ROM for hip and knee joints          Muscle Tone/Strength: Functionally intact. No obvious neuro-muscular anomalies detected.  Muscle Tone/Strength: Functionally intact. No obvious neuro-muscular anomalies detected.  Sensory (Neurological): Dermatomal pain pattern        Sensory (Neurological): Dermatomal pain pattern        DTR: Patellar: 0: absent Achilles: deferred today Plantar: deferred today  DTR: Patellar: 0: absent Achilles: deferred today Plantar: deferred today  Palpation: No palpable anomalies  Palpation: No palpable anomalies   Assessment  Primary Diagnosis & Pertinent Problem List: The primary encounter diagnosis was Failed back surgical syndrome. 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  Visit Diagnosis (New problems to examiner): 1. Failed back surgical syndrome   2. Inflammatory spondylopathy of lumbosacral region ([place])   3. Chronic radicular lumbar pain   4. Bilateral primary osteoarthritis of knee   5. History of bilateral knee replacement   6. Lumbar facet syndrome (Bilateral) (R>L)   7. Lumbar facet hypertrophy   8. Grade 1 Anterolisthesis L4/L5  9. 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 Chronic pain syndrome    Plan of Care (Initial workup plan)  -Chronic lumbar radicular pain: History of multilevel lumbar spinal fusion done in 2020.  Failed back surgical syndrome.  Has found benefit from caudal ESI in the past so we will plan on repeating.  Future considerations include spinal cord stimulator trial/implant for failed back surgical syndrome.  We will evaluate the patient's interlaminar windows during his caudal injection.  -Chronic low back pain related to lumbar facet arthropathy, lumbar spondylosis: Has found benefit from diagnostic L2-L5 lumbar facet medial branch nerve blocks in the past done with Dr. Dossie Arbour on 11/15/2018.  Can repeat in future.  -Bilateral knee pain in the context of bilateral knee osteoarthritis, status post bilateral knee replacement: Discussed diagnostic genicular nerve block.  Risks and benefits reviewed and patient would like to proceed.   Procedure Orders     GENICULAR NERVE BLOCK     Caudal Epidural Injection  Interventional management options: Jack Blanchard was informed that there is no guarantee that he would be a candidate for interventional therapies. The decision will be based on the results of diagnostic studies, as well as Jack Blanchard risk profile.  Procedure(s) under consideration:  Genicular nerve block, genicular RFA Caudal ESI SI joint injection Lumbar facet medial branch nerve block, lumbar  RFA Spinal cord stimulator trial   Provider-requested follow-up: Return in about 2 weeks (around 09/17/2020) for B/L GNB without sedation.  Future Appointments  Date Time Provider [place]  09/18/2020 11:00 AM Gillis Santa, MD [facility] None  10/02/2020 10:40 AM Gillis Santa, MD [facility] None    Note by: Gillis Santa, MD Date: 09/03/2020; Time: 11:11 AM

## 2020-09-06 ENCOUNTER — Other Ambulatory Visit: Payer: Self-pay | Admitting: Physician Assistant

## 2020-09-18 ENCOUNTER — Ambulatory Visit: Payer: Medicare Other | Admitting: Student in an Organized Health Care Education/Training Program

## 2020-09-18 ENCOUNTER — Ambulatory Visit: Payer: Medicare Other | Admitting: Urology

## 2020-09-18 NOTE — Progress Notes (Deleted)
09/18/2020 7:20 AM   [name] 07/13/1950 [id number]  Referring provider: Felicity Coyer, [place] [place],  [place] [id number]  No chief complaint on file.   HPI: Reeves Musick is a 70 y.o. male referred for lower urinary tract symptoms and an elevated PSA.   Seen 08/29/2020 In [facility] for a wellness visit and complained of sensation of incomplete bladder emptying and nocturia x2-3  IPSS  On tamsulosin X  Prior PSA results 3.26 May 2017 and 5.19 July 2019  PSA repeated 08/29/2020 and remained elevated at 5.6   PMH: Past Medical History:  Diagnosis Date  . Asthma   . Back pain   . Fibromyalgia   . History of degenerative disc disease     Surgical History: Past Surgical History:  Procedure Laterality Date  . BACK SURGERY     lumbar surgery with rods placed  . COLONOSCOPY WITH PROPOFOL N/A 08/03/2017   Procedure: COLONOSCOPY WITH PROPOFOL;  Surgeon: Lin Landsman, MD;  Location: [facility] ENDOSCOPY;  Service: Gastroenterology;  Laterality: N/A;  . COLONOSCOPY WITH PROPOFOL N/A 08/06/2020   Procedure: COLONOSCOPY WITH PROPOFOL;  Surgeon: Lin Landsman, MD;  Location: [facility] ENDOSCOPY;  Service: Gastroenterology;  Laterality: N/A;  . JOINT REPLACEMENT Bilateral   . LEG SURGERY    . TOTAL KNEE ARTHROPLASTY Right     Home Medications:  Allergies as of 09/18/2020      Reactions   Vancomycin Itching, Swelling   angioedema   Acetaminophen Palpitations      Medication List       Accurate as of Sep 18, 2020  7:20 AM. If you have any questions, ask your nurse or doctor.        albuterol (2.5 MG/3ML) 0.083% nebulizer solution Commonly known as: PROVENTIL Take 2.5 mg by nebulization every 4 (four) hours as needed for wheezing or shortness of breath.   albuterol 108 (90 Base) MCG/ACT inhaler Commonly known as: VENTOLIN HFA Inhale 2 puffs into the lungs every 4 (four) hours as needed for wheezing or shortness of breath.    atorvastatin 20 MG tablet Commonly known as: LIPITOR Take 20 mg by mouth at bedtime.   B-12 PO Take 1 tablet by mouth daily.   baclofen 10 MG tablet Commonly known as: LIORESAL Take 1-2 tablets (10-20 mg total) by mouth 3 (three) times daily for 30 days. What changed: how much to take   CALCIUM PO Take 1 tablet by mouth daily.   celecoxib 200 MG capsule Commonly known as: CELEBREX Take 1 capsule (200 mg total) by mouth 2 (two) times daily for 30 days. What changed: how much to take   DULoxetine 60 MG capsule Commonly known as: CYMBALTA Take 60 mg by mouth daily.   Flomax 0.4 MG Caps capsule Generic drug: tamsulosin Take 0.4 mg by mouth daily.   furosemide 20 MG tablet Commonly known as: LASIX Take 20 mg by mouth daily.   gabapentin 300 MG capsule Commonly known as: Neurontin Start by taking 1 cap (300 mg) PO HS. Increase by 1 cap every 7 days until taking 3 cap (900 mg) PO HS. What changed:   how much to take  how to take this  when to take this  additional instructions   MAGNESIUM PO Take 1 tablet by mouth daily.   methocarbamol 750 MG tablet Commonly known as: ROBAXIN Take 750 mg by mouth at bedtime.   OVER THE COUNTER MEDICATION Apply 1 application topically daily as needed (pain).  CBD cream       Allergies:  Allergies  Allergen Reactions  . Vancomycin Itching and Swelling    angioedema  . Acetaminophen Palpitations    Family History: Family History  Problem Relation Age of Onset  . Cancer Mother   . Hyperlipidemia Son   . Cancer Maternal Uncle   . Cancer Maternal Grandfather     Social History:  reports that he has been smoking cigars. He has never used smokeless tobacco. He reports current alcohol use. He reports current drug use. Drug: Marijuana.   Physical Exam: There were no vitals taken for this visit.  Constitutional:  Alert and oriented, No acute distress. HEENT: [place] AT, moist mucus membranes.  Trachea midline, no  masses. Cardiovascular: No clubbing, cyanosis, or edema. Respiratory: Normal respiratory effort, no increased work of breathing. GI: Abdomen is soft, nontender, nondistended, no abdominal masses GU: No CVA tenderness Lymph: No cervical or inguinal lymphadenopathy. Skin: No rashes, bruises or suspicious lesions. Neurologic: Grossly intact, no focal deficits, moving all 4 extremities. Psychiatric: Normal mood and affect.  Laboratory Data: Lab Results  Component Value Date   WBC 6.1 09/03/2017   HGB 14.5 09/03/2017   HCT 42.4 09/03/2017   MCV 97.0 09/03/2017   PLT 192 09/03/2017    Lab Results  Component Value Date   CREATININE 0.88 08/01/2018    No results found for: PSA  No results found for: TESTOSTERONE  No results found for: HGBA1C  Urinalysis No results found for: COLORURINE, APPEARANCEUR, LABSPEC, PHURINE, GLUCOSEU, HGBUR, BILIRUBINUR, KETONESUR, PROTEINUR, UROBILINOGEN, NITRITE, LEUKOCYTESUR  No results found for: LABMICR, WBCUA, RBCUA, LABEPIT, MUCUS, BACTERIA   Assessment & Plan:    1.  Elevated PSA  Benign DRE  Although PSA is a prostate cancer screening test he was informed that cancer is not the most common cause of an elevated PSA. Other potential causes including BPH and inflammation were discussed. He was informed that the only way to adequately diagnose prostate cancer would be a transrectal ultrasound and biopsy of the prostate. The procedure was discussed including potential risks of bleeding and infection/sepsis. He was also informed that a negative biopsy does not conclusively rule out the possibility that prostate cancer may be present and that continued monitoring is required. The use of newer adjunctive blood tests including PHI and 4kScore were discussed. The use of multiparametric prostate MRI to evaluate for lesions suspicious for high-grade prostate cancer as well as periodic surveillance were also discussed.  After discussing these options he has  elected   Abbie Sons, MD  [facility] [address], [place], [place] [address] [phone]

## 2020-09-20 ENCOUNTER — Encounter: Payer: Self-pay | Admitting: Urology

## 2020-10-02 ENCOUNTER — Other Ambulatory Visit: Payer: Self-pay

## 2020-10-02 ENCOUNTER — Ambulatory Visit
Admission: RE | Admit: 2020-10-02 | Discharge: 2020-10-02 | Disposition: A | Discharge status: Home / Self Care | Payer: Medicare Other | Source: Ambulatory Visit | Attending: Student in an Organized Health Care Education/Training Program | Admitting: Student in an Organized Health Care Education/Training Program

## 2020-10-02 ENCOUNTER — Encounter: Payer: Self-pay | Admitting: Student in an Organized Health Care Education/Training Program

## 2020-10-02 ENCOUNTER — Ambulatory Visit (HOSPITAL_BASED_OUTPATIENT_CLINIC_OR_DEPARTMENT_OTHER): Payer: Medicare Other | Admitting: Student in an Organized Health Care Education/Training Program

## 2020-10-02 VITALS — BP 166/78 | HR 61 | Temp 96.9°F | Resp 19 | Ht 71.0 in | Wt 280.0 lb

## 2020-10-02 DIAGNOSIS — M4697 Unspecified inflammatory spondylopathy, lumbosacral region: Secondary | ICD-10-CM | POA: Insufficient documentation

## 2020-10-02 DIAGNOSIS — M170 Bilateral primary osteoarthritis of knee: Secondary | ICD-10-CM | POA: Insufficient documentation

## 2020-10-02 DIAGNOSIS — G8929 Other chronic pain: Secondary | ICD-10-CM | POA: Diagnosis not present

## 2020-10-02 DIAGNOSIS — Z96653 Presence of artificial knee joint, bilateral: Secondary | ICD-10-CM | POA: Diagnosis present

## 2020-10-02 DIAGNOSIS — G894 Chronic pain syndrome: Secondary | ICD-10-CM

## 2020-10-02 DIAGNOSIS — M5416 Radiculopathy, lumbar region: Secondary | ICD-10-CM | POA: Insufficient documentation

## 2020-10-02 DIAGNOSIS — M961 Postlaminectomy syndrome, not elsewhere classified: Secondary | ICD-10-CM | POA: Insufficient documentation

## 2020-10-02 MED ORDER — SODIUM CHLORIDE 0.9% FLUSH
2.0000 mL | Freq: Once | INTRAVENOUS | Status: AC
  Administered 2020-10-02: 2 mL

## 2020-10-02 MED ORDER — IOHEXOL 180 MG/ML  SOLN
10.0000 mL | Freq: Once | INTRAMUSCULAR | Status: AC
  Administered 2020-10-02: 10 mL via EPIDURAL

## 2020-10-02 MED ORDER — SODIUM CHLORIDE (PF) 0.9 % IJ SOLN
INTRAMUSCULAR | Status: AC
  Filled 2020-10-02: qty 10

## 2020-10-02 MED ORDER — DEXAMETHASONE SODIUM PHOSPHATE 10 MG/ML IJ SOLN
10.0000 mg | Freq: Once | INTRAMUSCULAR | Status: AC
  Administered 2020-10-02: 10 mg
  Filled 2020-10-02: qty 1

## 2020-10-02 MED ORDER — LIDOCAINE HCL 2 % IJ SOLN
20.0000 mL | Freq: Once | INTRAMUSCULAR | Status: AC
  Administered 2020-10-02: 400 mg
  Filled 2020-10-02: qty 40

## 2020-10-02 MED ORDER — ROPIVACAINE HCL 2 MG/ML IJ SOLN
2.0000 mL | Freq: Once | INTRAMUSCULAR | Status: AC
  Administered 2020-10-02: 2 mL via EPIDURAL
  Filled 2020-10-02: qty 10

## 2020-10-02 NOTE — Progress Notes (Signed)
PROVIDER NOTE: Information contained herein reflects review and annotations entered in association with encounter. Interpretation of such information and data should be left to medically-trained personnel. Information provided to patient can be located elsewhere in the medical record under "Patient Instructions". Document created using STT-dictation technology, any transcriptional errors that may result from process are unintentional.    Patient: Jack Blanchard  Service Category: Procedure  Provider: Gillis Santa, MD  DOB: Apr 17, 1951  DOS: 10/02/2020  Location: [facility]  MRN: [id number]  Setting: Ambulatory - outpatient  Referring Provider: Theotis Burrow*  Type: Established Patient  Specialty: Interventional Pain Management  PCP: Theotis Burrow, MD   Primary Reason for Visit: Interventional Pain Management Treatment. CC:low back pain  Procedure:          Anesthesia, Analgesia, Anxiolysis:  Type: Diagnostic Epidural Steroid Injection #1  Region: Caudal Level: Sacrococcygeal   Laterality: Midline       Type: Local Anesthesia  Local Anesthetic: Lidocaine 1-2%  Position: Prone   Indications: 1. Failed back surgical syndrome   2. Inflammatory spondylopathy of lumbosacral region (Central)   3. Chronic radicular lumbar pain   4. Bilateral primary osteoarthritis of knee   5. History of bilateral knee replacement   6. Chronic pain syndrome    Pain Score: Pre-procedure: 5 /10 Post-procedure: 0-No pain/10   Pre-op H&P Assessment:  Mr. States is a 70 y.o. (year old), male patient, seen today for interventional treatment. He  has a past surgical history that includes Leg Surgery; Back surgery; Colonoscopy with propofol (N/A, 08/03/2017); Total knee arthroplasty (Right); Joint replacement (Bilateral); and Colonoscopy with propofol (N/A, 08/06/2020). Jack Blanchard has a current medication list which includes the following prescription(s): albuterol, albuterol, atorvastatin,  baclofen, calcium, celecoxib, cyanocobalamin, duloxetine, flomax, gabapentin, magnesium, methocarbamol, OVER THE COUNTER MEDICATION, and furosemide. His primarily concern today is the Knee Pain (bilat)  Initial Vital Signs:  Pulse/HCG Rate: 61ECG Heart Rate: (!) 53 Temp: (!) 96.9 F (36.1 C) Resp: 16 BP: (!) 157/79 SpO2: 100 %  BMI: Estimated body mass index is 39.05 kg/m as calculated from the following:   Height as of this encounter: 5\' 11"  (1.803 m).   Weight as of this encounter: 280 lb (127 kg).  Risk Assessment: Allergies: Reviewed. He is allergic to vancomycin and acetaminophen.  Allergy Precautions: None required Coagulopathies: Reviewed. None identified.  Blood-thinner therapy: None at this time Active Infection(s): Reviewed. None identified. [name] is afebrile  Site Confirmation: Jack Blanchard was asked to confirm the procedure and laterality before marking the site Procedure checklist: Completed Consent: Before the procedure and under the influence of no sedative(s), amnesic(s), or anxiolytics, the patient was informed of the treatment options, risks and possible complications. To fulfill our ethical and legal obligations, as recommended by the American Medical Association's Code of Ethics, I have informed the patient of my clinical impression; the nature and purpose of the treatment or procedure; the risks, benefits, and possible complications of the intervention; the alternatives, including doing nothing; the risk(s) and benefit(s) of the alternative treatment(s) or procedure(s); and the risk(s) and benefit(s) of doing nothing. The patient was provided information about the general risks and possible complications associated with the procedure. These may include, but are not limited to: failure to achieve desired goals, infection, bleeding, organ or nerve damage, allergic reactions, paralysis, and death. In addition, the patient was informed of those risks and complications  associated to Spine-related procedures, such as failure to decrease pain; infection (i.e.: Meningitis, epidural or  intraspinal abscess); bleeding (i.e.: epidural hematoma, subarachnoid hemorrhage, or any other type of intraspinal or peri-dural bleeding); organ or nerve damage (i.e.: Any type of peripheral nerve, nerve root, or spinal cord injury) with subsequent damage to sensory, motor, and/or autonomic systems, resulting in permanent pain, numbness, and/or weakness of one or several areas of the body; allergic reactions; (i.e.: anaphylactic reaction); and/or death. Furthermore, the patient was informed of those risks and complications associated with the medications. These include, but are not limited to: allergic reactions (i.e.: anaphylactic or anaphylactoid reaction(s)); adrenal axis suppression; blood sugar elevation that in diabetics may result in ketoacidosis or comma; water retention that in patients with history of congestive heart failure may result in shortness of breath, pulmonary edema, and decompensation with resultant heart failure; weight gain; swelling or edema; medication-induced neural toxicity; particulate matter embolism and blood vessel occlusion with resultant organ, and/or nervous system infarction; and/or aseptic necrosis of one or more joints. Finally, the patient was informed that Medicine is not an exact science; therefore, there is also the possibility of unforeseen or unpredictable risks and/or possible complications that may result in a catastrophic outcome. The patient indicated having understood very clearly. We have given the patient no guarantees and we have made no promises. Enough time was given to the patient to ask questions, all of which were answered to the patient's satisfaction. Jack Blanchard has indicated that he wanted to continue with the procedure. Attestation: I, the ordering provider, attest that I have discussed with the patient the benefits, risks, side-effects,  alternatives, likelihood of achieving goals, and potential problems during recovery for the procedure that I have provided informed consent. Date  Time: 10/02/2020 10:05 AM  Pre-Procedure Preparation:  Monitoring: As per clinic protocol. Respiration, ETCO2, SpO2, BP, heart rate and rhythm monitor placed and checked for adequate function Safety Precautions: Patient was assessed for positional comfort and pressure points before starting the procedure. Time-out: I initiated and conducted the "Time-out" before starting the procedure, as per protocol. The patient was asked to participate by confirming the accuracy of the "Time Out" information. Verification of the correct person, site, and procedure were performed and confirmed by me, the nursing staff, and the patient. "Time-out" conducted as per Joint Commission's Universal Protocol (UP.01.01.01). Time: 1058  Description of Procedure:          Target Area: Caudal Epidural Canal. Approach: Midline approach. Area Prepped: Entire Posterior Sacrococcygeal Region DuraPrep (Iodine Povacrylex [0.7% available iodine] and Isopropyl Alcohol, 74% w/w) Safety Precautions: Aspiration looking for blood return was conducted prior to all injections. At no point did we inject any substances, as a needle was being advanced. No attempts were made at seeking any paresthesias. Safe injection practices and needle disposal techniques used. Medications properly checked for expiration dates. SDV (single dose vial) medications used. Description of the Procedure: Protocol guidelines were followed. The patient was placed in position over the fluoroscopy table. The target area was identified and the area prepped in the usual manner. Skin & deeper tissues infiltrated with local anesthetic. Appropriate amount of time allowed to pass for local anesthetics to take effect. The procedure needles were then advanced to the target area. Proper needle placement secured. Negative aspiration  confirmed. Solution injected in intermittent fashion, asking for systemic symptoms every 0.5cc of injectate. The needles were then removed and the area cleansed, making sure to leave some of the prepping solution back to take advantage of its long term bactericidal properties. Vitals:   10/02/20 1010 10/02/20 1100 10/02/20  1105  BP: (!) 157/79 (!) 162/90 (!) 166/78  Pulse: 61    Resp: 16 12 19   Temp: (!) 96.9 F (36.1 C)    TempSrc: Temporal    SpO2: 100% 100% 100%  Weight: 280 lb (127 kg)    Height: 5\' 11"  (1.803 m)      Start Time: 1058 hrs. End Time: 1105 hrs. Materials:  Needle(s) Type: Epidural needle Gauge: 22G Length: 3.5-in Medication(s): Please see orders for medications and dosing details. 4 cc solution made of 2 cc of preservative-free saline, 1 cc of 0.2% ropivacaine, 1 cc of Decadron 10 mg/cc.  Imaging Guidance (Spinal):          Type of Imaging Technique: Fluoroscopy Guidance (Spinal) Indication(s): Assistance in needle guidance and placement for procedures requiring needle placement in or near specific anatomical locations not easily accessible without such assistance. Exposure Time: Please see nurses notes. Contrast: Before injecting any contrast, we confirmed that the patient did not have an allergy to iodine, shellfish, or radiological contrast. Once satisfactory needle placement was completed at the desired level, radiological contrast was injected. Contrast injected under live fluoroscopy. No contrast complications. See chart for type and volume of contrast used. Fluoroscopic Guidance: I was personally present during the use of fluoroscopy. "Tunnel Vision Technique" used to obtain the best possible view of the target area. Parallax error corrected before commencing the procedure. "Direction-depth-direction" technique used to introduce the needle under continuous pulsed fluoroscopy. Once target was reached, antero-posterior, oblique, and lateral fluoroscopic projection  used confirm needle placement in all planes. Images permanently stored in EMR. Interpretation: I personally interpreted the imaging intraoperatively. Adequate needle placement confirmed in multiple planes. Appropriate spread of contrast into desired area was observed. No evidence of afferent or efferent intravascular uptake. No intrathecal or subarachnoid spread observed. Permanent images saved into the patient's record.   Post-operative Assessment:  Post-procedure Vital Signs:  Pulse/HCG Rate: 6161 Temp: (!) 96.9 F (36.1 C) Resp: 19 BP: (!) 166/78 SpO2: 100 %  EBL: None  Complications: No immediate post-treatment complications observed by team, or reported by patient.  Note: The patient tolerated the entire procedure well. A repeat set of vitals were taken after the procedure and the patient was kept under observation following institutional policy, for this type of procedure. Post-procedural neurological assessment was performed, showing return to baseline, prior to discharge. The patient was provided with post-procedure discharge instructions, including a section on how to identify potential problems. Should any problems arise concerning this procedure, the patient was given instructions to immediately contact us, at any time, without hesitation. In any case, we plan to contact the patient by telephone for a follow-up status report regarding this interventional procedure.  Comments:  No additional relevant information.  Plan of Care   Plan for bilateral diagnostic genicular nerve block for bilateral knee pain status post bilateral knee replacements.  Orders:  Orders Placed This Encounter  Procedures  . GENICULAR NERVE BLOCK    Indication(s):  Sub-acute knee pain    Standing Status:   Future    Standing Expiration Date:   01/02/2021    Scheduling Instructions:     Side: Bilateral     Sedation: Patient's choice.     Timeframe: As soon as schedule allows    Order Specific  Question:   Where will this procedure be performed?    Answer:   [facility]  . DG PA[facility] C-ARM 1-60 MIN NO REPORT    Intraoperative interpretation by procedural  physician at [facility].    Standing Status:   Standing    Number of Occurrences:   1    Order Specific Question:   Reason for exam:    Answer:   Assistance in needle guidance and placement for procedures requiring needle placement in or near specific anatomical locations not easily accessible without such assistance.   Medications ordered for procedure: Meds ordered this encounter  Medications  . iohexol (OMNIPAQUE) 180 MG/ML injection 10 mL    Must be Myelogram-compatible. If not available, you may substitute with a water-soluble, non-ionic, hypoallergenic, myelogram-compatible radiological contrast medium.  Marland Kitchen lidocaine (XYLOCAINE) 2 % (with pres) injection 400 mg  . ropivacaine (PF) 2 mg/mL (0.2%) (NAROPIN) injection 2 mL  . sodium chloride flush (NS) 0.9 % injection 2 mL  . dexamethasone (DECADRON) injection 10 mg   Medications administered: We administered iohexol, lidocaine, ropivacaine (PF) 2 mg/mL (0.2%), sodium chloride flush, and dexamethasone.  See the medical record for exact dosing, route, and time of administration.  Follow-up plan:   Return in about 4 weeks (around 10/30/2020) for post procedure evaluation VV.     Recent Visits Date Type Provider Dept  09/03/20 Office Visit Gillis Santa, MD [facility]  Showing recent visits within past 90 days and meeting all other requirements Today's Visits Date Type Provider Dept  10/02/20 Procedure visit Gillis Santa, MD [facility]  Showing today's visits and meeting all other requirements Future Appointments Date Type Provider Dept  10/21/20 Appointment Gillis Santa, MD [facility]  Showing future appointments within next 90 days and meeting all other requirements  Disposition: Discharge home  Discharge  (Date  Time): 10/02/2020; 1109 hrs.   Primary Care Physician: Theotis Burrow, MD Location: [facility] Note by: Gillis Santa, MD Date: 10/02/2020; Time: 1:06 PM  Disclaimer:  Medicine is not an exact science. The only guarantee in medicine is that nothing is guaranteed. It is important to note that the decision to proceed with this intervention was based on the information collected from the patient. The Data and conclusions were drawn from the patient's questionnaire, the interview, and the physical examination. Because the information was provided in large part by the patient, it cannot be guaranteed that it has not been purposely or unconsciously manipulated. Every effort has been made to obtain as much relevant data as possible for this evaluation. It is important to note that the conclusions that lead to this procedure are derived in large part from the available data. Always take into account that the treatment will also be dependent on availability of resources and existing treatment guidelines, considered by other Pain Management Practitioners as being common knowledge and practice, at the time of the intervention. For Medico-Legal purposes, it is also important to point out that variation in procedural techniques and pharmacological choices are the acceptable norm. The indications, contraindications, technique, and results of the above procedure should only be interpreted and judged by a Board-Certified Interventional Pain Specialist with extensive familiarity and expertise in the same exact procedure and technique.

## 2020-10-02 NOTE — Progress Notes (Signed)
Safety precautions to be maintained throughout the outpatient stay will include: orient to surroundings, keep bed in low position, maintain call bell within reach at all times, provide assistance with transfer out of bed and ambulation.  

## 2020-10-02 NOTE — Patient Instructions (Signed)

## 2020-10-03 ENCOUNTER — Telehealth: Payer: Self-pay | Admitting: *Deleted

## 2020-10-03 NOTE — Telephone Encounter (Signed)
No problems post procedure. 

## 2020-10-16 ENCOUNTER — Ambulatory Visit: Payer: Medicare Other | Admitting: Urology

## 2020-10-18 ENCOUNTER — Encounter: Payer: Self-pay | Admitting: Urology

## 2020-10-21 ENCOUNTER — Other Ambulatory Visit: Payer: Self-pay

## 2020-10-21 ENCOUNTER — Ambulatory Visit (HOSPITAL_BASED_OUTPATIENT_CLINIC_OR_DEPARTMENT_OTHER): Payer: Medicare Other | Admitting: Student in an Organized Health Care Education/Training Program

## 2020-10-21 ENCOUNTER — Ambulatory Visit
Admission: RE | Admit: 2020-10-21 | Discharge: 2020-10-21 | Disposition: A | Discharge status: Home / Self Care | Payer: Medicare Other | Source: Ambulatory Visit | Attending: Student in an Organized Health Care Education/Training Program | Admitting: Student in an Organized Health Care Education/Training Program

## 2020-10-21 ENCOUNTER — Encounter: Payer: Self-pay | Admitting: Student in an Organized Health Care Education/Training Program

## 2020-10-21 VITALS — BP 134/71 | HR 68 | Temp 97.3°F | Resp 19 | Ht 71.0 in | Wt 280.0 lb

## 2020-10-21 DIAGNOSIS — M170 Bilateral primary osteoarthritis of knee: Secondary | ICD-10-CM | POA: Diagnosis not present

## 2020-10-21 DIAGNOSIS — G894 Chronic pain syndrome: Secondary | ICD-10-CM

## 2020-10-21 DIAGNOSIS — Z96653 Presence of artificial knee joint, bilateral: Secondary | ICD-10-CM

## 2020-10-21 MED ORDER — ROPIVACAINE HCL 2 MG/ML IJ SOLN
9.0000 mL | Freq: Once | INTRAMUSCULAR | Status: AC
  Administered 2020-10-21: 20 mL via PERINEURAL

## 2020-10-21 MED ORDER — LIDOCAINE HCL 2 % IJ SOLN
20.0000 mL | Freq: Once | INTRAMUSCULAR | Status: AC
  Administered 2020-10-21: 400 mg
  Filled 2020-10-21: qty 20

## 2020-10-21 MED ORDER — DEXAMETHASONE SODIUM PHOSPHATE 10 MG/ML IJ SOLN
10.0000 mg | Freq: Once | INTRAMUSCULAR | Status: AC
  Administered 2020-10-21: 10 mg
  Filled 2020-10-21: qty 1

## 2020-10-21 MED ORDER — ROPIVACAINE HCL 2 MG/ML IJ SOLN
INTRAMUSCULAR | Status: AC
  Filled 2020-10-21: qty 20

## 2020-10-21 NOTE — Progress Notes (Signed)
Safety precautions to be maintained throughout the outpatient stay will include: orient to surroundings, keep bed in low position, maintain call bell within reach at all times, provide assistance with transfer out of bed and ambulation.  

## 2020-10-21 NOTE — Patient Instructions (Addendum)
____________________________________________________________________________________________  Post-procedure Information What to expect: Most procedures involve the use of a local anesthetic (numbing medicine), and a steroid (anti-inflammatory medicine).  The local anesthetics may cause temporary numbness and weakness of the legs or arms, depending on the location of the block. This numbness/weakness may last 4-6 hours, depending on the local anesthetic used. In rare instances, it can last up to 24 hours. While numb, you must be very careful not to injure the extremity.  After any procedure, you could expect the pain to get better within 15-20 minutes. This relief is temporary and may last 4-6 hours. Once the local anesthetics wears off, you could experience discomfort, possibly more than usual, for up to 10 (ten) days. In the case of radiofrequencies, it may last up to 6 weeks. Surgeries may take up to 8 weeks for the healing process. The discomfort is due to the irritation caused by needles going through skin and muscle. To minimize the discomfort, we recommend using ice the first day, and heat from then on. The ice should be applied for 15 minutes on, and 15 minutes off. Keep repeating this cycle until bedtime. Avoid applying the ice directly to the skin, to prevent frostbite. Heat should be used daily, until the pain improves (4-10 days). Be careful not to burn yourself.  Occasionally you may experience muscle spasms or cramps. These occur as a consequence of the irritation caused by the needle sticks to the muscle and the blood that will inevitably be lost into the surrounding muscle tissue. Blood tends to be very irritating to tissues, which tend to react by going into spasm. These spasms may start the same day of your procedure, but they may also take days to develop. This late onset type of spasm or cramp is usually caused by electrolyte imbalances triggered by the steroids, at the level of the  kidney. Cramps and spasms tend to respond well to muscle relaxants, multivitamins (some are triggered by the procedure, but may have their origins in vitamin deficiencies), and "Gatorade", or any sports drinks that can replenish any electrolyte imbalances. (If you are a diabetic, ask your pharmacist to get you a sugar-free brand.) Warm showers or baths may also be helpful. Stretching exercises are highly recommended.  General Instructions:  Be alert for signs of possible infection: redness, swelling, heat, red streaks, elevated temperature, and/or fever. These typically appear 4 to 6 days after the procedure. Immediately notify your doctor if you experience unusual bleeding, difficulty breathing, or loss of bowel or bladder control. If you experience increased pain, do not increase your pain medicine intake, unless instructed by your pain physician.  Post-Procedure Care:  Be careful in moving about. Muscle spasms in the area of the injection may occur. Applying ice or heat to the area is often helpful. The incidence of spinal headaches after epidural injections ranges between 1.4% and 6%. If you develop a headache that does not seem to respond to conservative therapy, please let your physician know. This can be treated with an epidural blood patch.   Post-procedure numbness or redness is to be expected, however it should average 4 to 6 hours. If numbness and weakness of your extremities begins to develop 4 to 6 hours after your procedure, and is felt to be progressing and worsening, immediately contact your physician.  Diet:  If you experience nausea, do not eat until this sensation goes away. If you had a "Stellate Ganglion Block" for upper extremity "Reflex Sympathetic Dystrophy", do not eat or   drink until your hoarseness goes away. In any case, always start with liquids first and if you tolerate them well, then slowly progress to more solid foods.  Activity:  For the first 4 to 6 hours after the  procedure, use caution in moving about as you may experience numbness and/or weakness. Use caution in cooking, using household electrical appliances, and climbing steps. If you need to reach your Doctor call our office: [phone] (During business hours) or [phone] (After business hours).  Business Hours: Monday-Thursday 8:00 am - 4:00 PM    Fridays: Closed     In case of an emergency: In case of emergency, call 911 or go to the nearest emergency room and have the physician there call us.  Interpretation of Procedure Every nerve block has two components: a diagnostic component, and a treatment component. Unrealistic expectations are the most common causes of "perceived failure".  In a perfect world, a single nerve block should be able to completely and permanently eliminate the pain. Sadly, the world is not perfect.  Most pain management nerve blocks are performed using local anesthetics and steroids. Steroids are responsible for any long-term benefit that you may experience. Their purpose is to decrease any chronic swelling that may exist in the area. Steroids begin to work immediately after being injected. However, most patients will not experience any benefits until 5 to 10 days after the injection, when the swelling has come down to the point where they can tell a difference. Steroids will only help if there is swelling to be treated. As such, they can assist with the diagnosis. If effective, they suggest an inflammatory component to the pain, and if ineffective, they rule out inflammation as the main cause or component of the problem. If the problem is one of mechanical compression, you will get no benefit from those steroids.   In the case of local anesthetics, they have a crucial role in the diagnosis of your condition. Most will begin to work within15 to 20 minutes after injection. The duration will depend on the type used (short- vs. Long-acting). It is of outmost importance that  patients keep tract of their pain, after the procedure. To assist with this matter, a "Post-procedure Pain Diary" is provided. Make sure to complete it and to bring it back to your follow-up appointment.  As long as the patient keeps accurate, detailed records of their symptoms after every procedure, and returns to have those interpreted, every procedure will provide us with invaluable information. Even a block that does not provide the patient with any relief, will always provide us with information about the mechanism and the origin of the pain. The only time a nerve block can be considered a waste of time is when patients do not keep track of the results, or do not keep their post-procedure appointment.  Reporting the results back to your physician The Pain Score  Pain is a subjective complaint. It cannot be seen, touched, or measured. We depend entirely on the patient's report of the pain in order to assess your condition and treatment. To evaluate the pain, we use a pain scale, where "0" means "No Pain", and a "10" is "the worst possible pain that you can even imagine" (i.e. something like been eaten alive by a shark or being torn apart by a lion).   Use the Pain Scale provided. You will frequently be asked to rate your pain. Please be accurate, remember that medical decisions will be based on your   responses. Please do not rate your pain above a 10. Doing so is actually interpreted as "symptom magnification" (exaggeration). To put this into perspective, when you tell us that your pain is at a 10 (ten), what you are saying is that there is nothing we can do to make this pain any worse. (Carefully think about that.) ____________________________________________________________________________________________   ____________________________________________________________________________________________  Genicular Nerve Block  What is a genicular nerve block? A genicular nerve block is the injection  of a local anesthetic to block the nerves that transmits pain from the knee.  What is the purpose of a facet nerve block? A genicular nerve block is a diagnostic procedure to determine if the pathologic changes (i.e. arthritis, meniscal tears, etc) and inflammation within the knee joint is the source of your knee pain. It also confirms that the knee pain will respond well to the actual treatment procedure. If a genicular nerve block works, it will give you relief for several hours. After that, the pain is expected to return to normal. This test is always performed twice (usually a week or two apart) because two successful tests are required to move onto treatment. If both diagnostic tests are positive, then we schedule a treatment called radiofrequency (RF) ablation. In this procedure, the same nerves are cauterized, which typically leads to pain relief for 4 -18 months. If this process works well for one knee, it can be performed on the other knee if needed.  How is the procedure performed? You will be placed on the procedure table. The injection site is sterilized with either iodine or chlorhexadine. The site to be injected is numbed with a local anesthetic, and a needle is directed to the target area. X-ray guidance is used to ensure proper placement and positioning of the needle. When the needle is properly positioned near the genicular nerve, local anesthetic is injected to numb that nerve. This will be repeated at multiple sites around the knee to block all genicular nerves.  Will the procedure be painful? The injection can be painful and we therefore provide the option of receiving IV sedation. IV sedation, combined with local anesthetic, can make the injection nearly pain free. It allows you to remain very still during the procedure, which can also make the injection easier, faster, and more successful. If you decide to have IV sedation, you must have a driver to get you home safely afterwards. In  addition, you cannot have anything to eat or drink within 8 hours of your appointment (clear liquids are allowed until 3 hours before the procedure). If you take medications for diabetes, these medications may need to be adjusted the morning of the procedure. Your primary care physician can help you with this adjustment.  What are the discharge instructions? If you received IV sedation do not drive or operate machinery for at least 24 hours after the procedure. You may return to work the next day following your procedure. You may resume your normal diet immediately. Do not engage in any strenuous activity for 24 hours. You should, however, engage in moderate activity that typically causes your ususal pain. If the block works, those activities should not be painful for several hours after the injection. Do not take a bath, swim, or use a hot tub for 24 hours (you may take a shower). Call the office if you have any of the following: severe pain afterwards (different than your usual symptoms), redness/swelling/discharge at the injection site(s), fevers/chills, difficulty with bowel or bladder functions.  What  are the risks and side effects? The complication rate for this procedure is very low. Whenever a needle enters the skin, bleeding or infection can occur. Some other serious but extremely rare risks include paralysis and death. You may have an allergic reaction to any of the medications used. If you have a known allergy to any medications, especially local anesthetics, notify our staff before the procedure takes place. You may experience any of the following side effects up to 4 - 6 hours after the procedure: . Leg muscle weakness or numbness may occur due to the local anesthetic affecting the nerves that control your legs (this is a temporary affect and it is not paralysis). If you have any leg weakness or numbness, walk only with assistance in order to prevent falls and injury. Your leg strength will  return slowly and completely. . Dizziness may occur due to a decrease in your blood pressure. If this occurs, remain in a seated or lying position. Gradually sit up, and then stand after at least 10 minutes of sitting. . Mild headaches may occur. Drink fluids and take pain medications if needed. If the headaches persist or become severe, call the office. . Mild discomfort at the injection site can occur. This typically lasts for a few hours but can persist for a couple days. If this occurs, take anti-inflammatories or pain medications, apply ice to the area the day of the procedure. If it persists, apply moist heat in the day(s) following.  The side effects listed above can be normal. They are not dangerous and will resolve on their own. If, however, you experience any of the following, a complication may have occurred and you should either contact your doctor. If he is not readily available, then you should proceed to the closest urgent care center for evaluation: . Severe or progressive pain at the injection site(s) . Arm or leg weakness that progressively worsens or persists for longer than 8 hours . Severe or progressive redness, swelling, or discharge from the injections site(s) . Fevers, chills, nausea, or vomiting . Bowel or bladder dysfunction (i.e. inability to urinate or pass stool or difficulty controlling either)  How long does it take for the procedure to work? You should feel relief from your usual pain within the first hour. Again, this is only expected to last for several hours, at the most. Remember, you may be sore in the middle part of your back from the needles, and you must distinguish this from your usual pain. ____________________________________________________________________________________________

## 2020-10-21 NOTE — Progress Notes (Signed)
PROVIDER NOTE: Information contained herein reflects review and annotations entered in association with encounter. Interpretation of such information and data should be left to medically-trained personnel. Information provided to patient can be located elsewhere in the medical record under "Patient Instructions". Document created using STT-dictation technology, any transcriptional errors that may result from process are unintentional.    Patient: Jack Blanchard  Service Category: Procedure  Provider: Gillis Santa, MD  DOB: March 10, 1951  DOS: 10/21/2020  Location: [facility]  MRN: [id number]  Setting: Ambulatory - outpatient  Referring Provider: Theotis Burrow*  Type: Established Patient  Specialty: Interventional Pain Management  PCP: Theotis Burrow, MD   Primary Reason for Visit: Interventional Pain Management Treatment. CC: Knee Pain (Bilateral )  Procedure:          Anesthesia, Analgesia, Anxiolysis:  Type: Genicular Nerves Block (Superolateral, Superomedial, and Inferomedial Genicular Nerves) #1  CPT: 61443      Primary Purpose: Diagnostic Region: Lateral, Anterior, and Medial aspects of the knee joint, above and below the knee joint proper. Level: Superior and inferior to the knee joint. Target Area: For Genicular Nerve block(s), the targets are: the superolateral genicular nerve, located in the lateral distal portion of the femoral shaft as it curves to form the lateral epicondyle, in the region of the distal femoral metaphysis; the superomedial genicular nerve, located in the medial distal portion of the femoral shaft as it curves to form the medial epicondyle; and the inferomedial genicular nerve, located in the medial, proximal portion of the tibial shaft, as it curves to form the medial epicondyle, in the region of the proximal tibial metaphysis. Approach: Anterior, percutaneous, ipsilateral approach. Laterality: Bilateral  Type: Local Anesthesia  Local  Anesthetic: Lidocaine 1-2%  Position: Modified Fowler's position with pillows under the targeted knee(s).   Indications: 1. Bilateral primary osteoarthritis of knee   2. History of bilateral knee replacement   3. Chronic pain syndrome    Pain Score: Pre-procedure: 4 /10 Post-procedure: 4 /10   Pre-op H&P Assessment:  Mr. Severns is a 70 y.o. (year old), male patient, seen today for interventional treatment. He  has a past surgical history that includes Leg Surgery; Back surgery; Colonoscopy with propofol (N/A, 08/03/2017); Total knee arthroplasty (Right); Joint replacement (Bilateral); and Colonoscopy with propofol (N/A, 08/06/2020). Mr. Sigmon has a current medication list which includes the following prescription(s): albuterol, albuterol, atorvastatin, baclofen, calcium, celecoxib, cyanocobalamin, duloxetine, flomax, gabapentin, magnesium, methocarbamol, OVER THE COUNTER MEDICATION, and furosemide. His primarily concern today is the Knee Pain (Bilateral )  Initial Vital Signs:  Pulse/HCG Rate: 68ECG Heart Rate: 69 Temp: (!) 97.3 F (36.3 C) Resp: 16 BP: (!) 143/79 SpO2: 100 %  BMI: Estimated body mass index is 39.05 kg/m as calculated from the following:   Height as of this encounter: 5\' 11"  (1.803 m).   Weight as of this encounter: 280 lb (127 kg).  Risk Assessment: Allergies: Reviewed. He is allergic to vancomycin and acetaminophen.  Allergy Precautions: None required Coagulopathies: Reviewed. None identified.  Blood-thinner therapy: None at this time Active Infection(s): Reviewed. None identified. Mr. Gosse is afebrile  Site Confirmation: Mr. Durr was asked to confirm the procedure and laterality before marking the site Procedure checklist: Completed Consent: Before the procedure and under the influence of no sedative(s), amnesic(s), or anxiolytics, the patient was informed of the treatment options, risks and possible complications. To fulfill our ethical and legal obligations, as  recommended by the American Medical Association's Code of Ethics, I have informed  the patient of my clinical impression; the nature and purpose of the treatment or procedure; the risks, benefits, and possible complications of the intervention; the alternatives, including doing nothing; the risk(s) and benefit(s) of the alternative treatment(s) or procedure(s); and the risk(s) and benefit(s) of doing nothing. The patient was provided information about the general risks and possible complications associated with the procedure. These may include, but are not limited to: failure to achieve desired goals, infection, bleeding, organ or nerve damage, allergic reactions, paralysis, and death. In addition, the patient was informed of those risks and complications associated to the procedure, such as failure to decrease pain; infection; bleeding; organ or nerve damage with subsequent damage to sensory, motor, and/or autonomic systems, resulting in permanent pain, numbness, and/or weakness of one or several areas of the body; allergic reactions; (i.e.: anaphylactic reaction); and/or death. Furthermore, the patient was informed of those risks and complications associated with the medications. These include, but are not limited to: allergic reactions (i.e.: anaphylactic or anaphylactoid reaction(s)); adrenal axis suppression; blood sugar elevation that in diabetics may result in ketoacidosis or comma; water retention that in patients with history of congestive heart failure may result in shortness of breath, pulmonary edema, and decompensation with resultant heart failure; weight gain; swelling or edema; medication-induced neural toxicity; particulate matter embolism and blood vessel occlusion with resultant organ, and/or nervous system infarction; and/or aseptic necrosis of one or more joints. Finally, the patient was informed that Medicine is not an exact science; therefore, there is also the possibility of unforeseen or  unpredictable risks and/or possible complications that may result in a catastrophic outcome. The patient indicated having understood very clearly. We have given the patient no guarantees and we have made no promises. Enough time was given to the patient to ask questions, all of which were answered to the patient's satisfaction. Mr. Schools has indicated that he wanted to continue with the procedure. Attestation: I, the ordering provider, attest that I have discussed with the patient the benefits, risks, side-effects, alternatives, likelihood of achieving goals, and potential problems during recovery for the procedure that I have provided informed consent. Date  Time: 10/21/2020 11:04 AM  Pre-Procedure Preparation:  Monitoring: As per clinic protocol. Respiration, ETCO2, SpO2, BP, heart rate and rhythm monitor placed and checked for adequate function Safety Precautions: Patient was assessed for positional comfort and pressure points before starting the procedure. Time-out: I initiated and conducted the "Time-out" before starting the procedure, as per protocol. The patient was asked to participate by confirming the accuracy of the "Time Out" information. Verification of the correct person, site, and procedure were performed and confirmed by me, the nursing staff, and the patient. "Time-out" conducted as per Joint Commission's Universal Protocol (UP.01.01.01). Time: 1147  Description of Procedure:          Area Prepped: Entire knee area, from mid-thigh to mid-shin, lateral, anterior, and medial aspects. DuraPrep (Iodine Povacrylex [0.7% available iodine] and Isopropyl Alcohol, 74% w/w) Safety Precautions: Aspiration looking for blood return was conducted prior to all injections. At no point did we inject any substances, as a needle was being advanced. No attempts were made at seeking any paresthesias. Safe injection practices and needle disposal techniques used. Medications properly checked for expiration  dates. SDV (single dose vial) medications used. Description of the Procedure: Protocol guidelines were followed. The patient was placed in position over the procedure table. The target area was identified and the area prepped in the usual manner. Skin & deeper tissues infiltrated with  local anesthetic. Appropriate amount of time allowed to pass for local anesthetics to take effect. The procedure needles were then advanced to the target area. Proper needle placement secured. Negative aspiration confirmed. Solution injected in intermittent fashion, asking for systemic symptoms every 0.5cc of injectate. The needles were then removed and the area cleansed, making sure to leave some of the prepping solution back to take advantage of its long term bactericidal properties.  Vitals:   10/21/20 1106 10/21/20 1148 10/21/20 1155 10/21/20 1159  BP: (!) 143/79 135/76 134/71   Pulse: 68     Resp: 16 20 20 19   Temp: (!) 97.3 F (36.3 C)     TempSrc: Temporal     SpO2: 100% 100% 99% 99%  Weight: 280 lb (127 kg)     Height: 5\' 11"  (1.803 m)       Start Time: 1147 hrs. End Time: 1200 hrs. Materials:  Needle(s) Type: Spinal Needle Gauge: 22G Length: 3.5-in Medication(s): Please see orders for medications and dosing details. 9 cc solution made of 8 cc of 0.2% Ropivacaine, 1 cc of Decadron 10 mg/cc. 3 cc injected at each level for the left knee 9 cc solution made of 8 cc of 0.2% Ropivacaine, 1 cc of Decadron 10 mg/cc. 3 cc injected at each level for the right knee Total steroid dose= 20 mg Decadron  Imaging Guidance (Non-Spinal):          Type of Imaging Technique: Fluoroscopy Guidance (Non-Spinal) Indication(s): Assistance in needle guidance and placement for procedures requiring needle placement in or near specific anatomical locations not easily accessible without such assistance. Exposure Time: Please see nurses notes. Contrast: None used. Fluoroscopic Guidance: I was personally present during the use  of fluoroscopy. "Tunnel Vision Technique" used to obtain the best possible view of the target area. Parallax error corrected before commencing the procedure. "Direction-depth-direction" technique used to introduce the needle under continuous pulsed fluoroscopy. Once target was reached, antero-posterior, oblique, and lateral fluoroscopic projection used confirm needle placement in all planes. Images permanently stored in EMR. Interpretation: No contrast injected. I personally interpreted the imaging intraoperatively. Adequate needle placement confirmed in multiple planes. Permanent images saved into the patient's record.  Post-operative Assessment:  Post-procedure Vital Signs:  Pulse/HCG Rate: 6866 Temp: (!) 97.3 F (36.3 C) Resp: 19 BP: 134/71 SpO2: 99 %  EBL: None  Complications: No immediate post-treatment complications observed by team, or reported by patient.  Note: The patient tolerated the entire procedure well. A repeat set of vitals were taken after the procedure and the patient was kept under observation following institutional policy, for this type of procedure. Post-procedural neurological assessment was performed, showing return to baseline, prior to discharge. The patient was provided with post-procedure discharge instructions, including a section on how to identify potential problems. Should any problems arise concerning this procedure, the patient was given instructions to immediately contact us, at any time, without hesitation. In any case, we plan to contact the patient by telephone for a follow-up status report regarding this interventional procedure.  Comments:  No additional relevant information.  Plan of Care  Orders:  Orders Placed This Encounter  Procedures  . [facility] C-ARM 1-60 MIN NO REPORT    Intraoperative interpretation by procedural physician at [place].    Standing Status:   Standing    Number of Occurrences:   1    Order Specific  Question:   Reason for exam:    Answer:   Assistance in needle guidance  and placement for procedures requiring needle placement in or near specific anatomical locations not easily accessible without such assistance.   Medications ordered for procedure: Meds ordered this encounter  Medications  . lidocaine (XYLOCAINE) 2 % (with pres) injection 400 mg  . ropivacaine (PF) 2 mg/mL (0.2%) (NAROPIN) injection 9 mL  . ropivacaine (PF) 2 mg/mL (0.2%) (NAROPIN) injection 9 mL  . dexamethasone (DECADRON) injection 10 mg  . dexamethasone (DECADRON) injection 10 mg   Medications administered: We administered lidocaine, ropivacaine (PF) 2 mg/mL (0.2%), ropivacaine (PF) 2 mg/mL (0.2%), dexamethasone, and dexamethasone.  See the medical record for exact dosing, route, and time of administration.  Follow-up plan:   Return in about 4 weeks (around 11/18/2020) for Post Procedure Evaluation, in person.      Recent Visits Date Type Provider Dept  10/02/20 Procedure visit Gillis Santa, MD [facility]  09/03/20 Office Visit Gillis Santa, MD [facility]  Showing recent visits within past 90 days and meeting all other requirements Today's Visits Date Type Provider Dept  10/21/20 Procedure visit Gillis Santa, MD [facility]  Showing today's visits and meeting all other requirements Future Appointments Date Type Provider Dept  12/12/20 Appointment Gillis Santa, MD [facility]  Showing future appointments within next 90 days and meeting all other requirements  Disposition: Discharge home  Discharge (Date  Time): 10/21/2020;   hrs.   Primary Care Physician: Theotis Burrow, MD Location: [facility] Note by: Gillis Santa, MD Date: 10/21/2020; Time: 12:13 PM  Disclaimer:  Medicine is not an exact science. The only guarantee in medicine is that nothing is guaranteed. It is important to note that the decision to proceed with this  intervention was based on the information collected from the patient. The Data and conclusions were drawn from the patient's questionnaire, the interview, and the physical examination. Because the information was provided in large part by the patient, it cannot be guaranteed that it has not been purposely or unconsciously manipulated. Every effort has been made to obtain as much relevant data as possible for this evaluation. It is important to note that the conclusions that lead to this procedure are derived in large part from the available data. Always take into account that the treatment will also be dependent on availability of resources and existing treatment guidelines, considered by other Pain Management Practitioners as being common knowledge and practice, at the time of the intervention. For Medico-Legal purposes, it is also important to point out that variation in procedural techniques and pharmacological choices are the acceptable norm. The indications, contraindications, technique, and results of the above procedure should only be interpreted and judged by a Board-Certified Interventional Pain Specialist with extensive familiarity and expertise in the same exact procedure and technique.

## 2020-10-22 ENCOUNTER — Telehealth: Payer: Self-pay | Admitting: *Deleted

## 2020-10-22 NOTE — Telephone Encounter (Signed)
Attempted to call for post procedure follow-up. Message left. 

## 2020-10-30 ENCOUNTER — Telehealth: Payer: Medicare Other | Admitting: Student in an Organized Health Care Education/Training Program

## 2020-12-12 ENCOUNTER — Ambulatory Visit: Payer: Medicare Other | Admitting: Student in an Organized Health Care Education/Training Program

## 2020-12-25 ENCOUNTER — Emergency Department
Admission: EM | Admit: 2020-12-25 | Discharge: 2020-12-25 | Disposition: A | Discharge status: Home / Self Care | Payer: Medicare Other | Attending: Emergency Medicine | Admitting: Emergency Medicine

## 2020-12-25 ENCOUNTER — Other Ambulatory Visit: Payer: Self-pay

## 2020-12-25 ENCOUNTER — Emergency Department: Payer: Medicare Other

## 2020-12-25 ENCOUNTER — Encounter: Payer: Self-pay | Admitting: Emergency Medicine

## 2020-12-25 DIAGNOSIS — R079 Chest pain, unspecified: Secondary | ICD-10-CM | POA: Diagnosis present

## 2020-12-25 DIAGNOSIS — J45909 Unspecified asthma, uncomplicated: Secondary | ICD-10-CM | POA: Diagnosis not present

## 2020-12-25 DIAGNOSIS — Z96653 Presence of artificial knee joint, bilateral: Secondary | ICD-10-CM | POA: Insufficient documentation

## 2020-12-25 DIAGNOSIS — F17290 Nicotine dependence, other tobacco product, uncomplicated: Secondary | ICD-10-CM | POA: Diagnosis not present

## 2020-12-25 DIAGNOSIS — R0789 Other chest pain: Secondary | ICD-10-CM

## 2020-12-25 DIAGNOSIS — J441 Chronic obstructive pulmonary disease with (acute) exacerbation: Secondary | ICD-10-CM | POA: Diagnosis not present

## 2020-12-25 DIAGNOSIS — R001 Bradycardia, unspecified: Secondary | ICD-10-CM | POA: Insufficient documentation

## 2020-12-25 LAB — CBC
HCT: 40.2 % (ref 39.0–52.0)
Hemoglobin: 13.9 g/dL (ref 13.0–17.0)
MCH: 33 pg (ref 26.0–34.0)
MCHC: 34.6 g/dL (ref 30.0–36.0)
MCV: 95.5 fL (ref 80.0–100.0)
Platelets: 254 10*3/uL (ref 150–400)
RBC: 4.21 MIL/uL — ABNORMAL LOW (ref 4.22–5.81)
RDW: 15.9 % — ABNORMAL HIGH (ref 11.5–15.5)
WBC: 9.9 10*3/uL (ref 4.0–10.5)
nRBC: 0 % (ref 0.0–0.2)

## 2020-12-25 LAB — COMPREHENSIVE METABOLIC PANEL
ALT: 17 U/L (ref 0–44)
AST: 19 U/L (ref 15–41)
Albumin: 4.4 g/dL (ref 3.5–5.0)
Alkaline Phosphatase: 67 U/L (ref 38–126)
Anion gap: 7 (ref 5–15)
BUN: 20 mg/dL (ref 8–23)
CO2: 30 mmol/L (ref 22–32)
Calcium: 9.4 mg/dL (ref 8.9–10.3)
Chloride: 106 mmol/L (ref 98–111)
Creatinine, Ser: 0.87 mg/dL (ref 0.61–1.24)
GFR, Estimated: >60 mL/min (ref >60)
Glucose, Bld: 67 mg/dL — ABNORMAL LOW (ref 70–99)
Potassium: 3.5 mmol/L (ref 3.5–5.1)
Sodium: 143 mmol/L (ref 135–145)
Total Bilirubin: 1 mg/dL (ref 0.3–1.2)
Total Protein: 7.6 g/dL (ref 6.5–8.1)

## 2020-12-25 LAB — TROPONIN I (HIGH SENSITIVITY)
Troponin I (High Sensitivity): 5 ng/L (ref <18)
Troponin I (High Sensitivity): 5 ng/L (ref <18)

## 2020-12-25 MED ORDER — ALBUTEROL SULFATE HFA 108 (90 BASE) MCG/ACT IN AERS: 2.0000 | INHALATION_SPRAY | RESPIRATORY_TRACT | 0 refills | Status: AC | PRN

## 2020-12-25 MED ORDER — PREDNISONE 20 MG PO TABS: 20.0000 mg | ORAL_TABLET | Freq: Two times a day (BID) | ORAL | 0 refills | Status: AC

## 2020-12-25 MED ORDER — AZITHROMYCIN 250 MG PO TABS: ORAL_TABLET | ORAL | 0 refills | Status: DC

## 2020-12-25 MED ORDER — IPRATROPIUM-ALBUTEROL 0.5-2.5 (3) MG/3ML IN SOLN
3.0000 mL | Freq: Once | RESPIRATORY_TRACT | Status: AC
  Administered 2020-12-25: 3 mL via RESPIRATORY_TRACT
  Filled 2020-12-25: qty 3

## 2020-12-25 NOTE — ED Triage Notes (Signed)
Pt comes into the ED via [facility] from home his [place] office c/o left side chest pain that radiates into his neck.  Pt states the pain has been intermittent x 1 week.  Pt currently has even and unlabored respirations and is in NAD.  Pt denies any known cardiac history.

## 2020-12-25 NOTE — ED Notes (Signed)
See triage note  Presents via EMS   States he developed some c/p while at [employer] office    States he has had intermittent chest pain for about 1 week  No fever or cough

## 2020-12-25 NOTE — ED Notes (Signed)
First nurse note: c/o CP during check up today. [id number] systolic. 1 nitro given SL. x4 aspirin. 12 lead WNL.

## 2020-12-25 NOTE — ED Notes (Signed)
Troponin hemolyzed. Phlebotomy called for redraw at this time.

## 2020-12-25 NOTE — ED Provider Notes (Signed)
[facility] Emergency Department Provider Note  ____________________________________________  Time seen: Approximately 4:11 PM  I have reviewed the triage vital signs and the nursing notes.   HISTORY  Chief Complaint Chest Pain    HPI Jack Blanchard is a 70 y.o. male with a history of chronic pain, obesity, COPD who comes ED complaining of left-sided chest pain that feels like tightness, radiates to the neck.  Not exertional, not pleuritic.  He had 1 episode a week ago while at rest at home which self resolved.  He started having recurrent symptoms this morning at 9:00 AM which have been persistent since onset, no aggravating or alleviating factors.  He has been compliant with his medications.    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   Osteoarthritis of facet joint of lumbar spine 08/22/2018   Grade 1 Anterolisthesis L4/L5 08/22/2018   Lumbar facet hypertrophy 08/22/2018   Osteoarthritis of hip (Right) 08/22/2018   Tricompartment osteoarthritis of knees (Bilateral) 08/22/2018   Osteoarthritis of patellofemoral joints of knees (Bilateral) 08/22/2018   Neurogenic pain 08/22/2018   Burning sensation of lower extremity 08/22/2018   Peripheral neuropathy 08/22/2018   Chronic musculoskeletal pain 08/22/2018   Fibromyalgia 08/22/2018   Marijuana use 08/08/2018   Vitamin D insufficiency  08/04/2018   Chronic pain syndrome 08/01/2018   Pharmacologic therapy 08/01/2018   Disorder of skeletal system 08/01/2018   Problems influencing health status 08/01/2018   Chronic low back pain (Secondary Area of Pain) (Bilateral) (R>L) w/ sciatica (Bilateral) 08/01/2018   Pain in limb 08/01/2018   Chronic knee pain (Tertiary Area of Pain) (Right) 08/01/2018   Bilateral primary osteoarthritis of knee 09/22/2017   Special screening for malignant neoplasms, colon    Chronic midline low back pain with right-sided sciatica 04/24/2016     Past Surgical History:  Procedure Laterality Date   BACK SURGERY     lumbar surgery with rods placed   COLONOSCOPY WITH PROPOFOL N/A 08/03/2017   Procedure: COLONOSCOPY WITH PROPOFOL;  Surgeon: Lin Landsman, MD;  Location: [facility] ENDOSCOPY;  Service: Gastroenterology;  Laterality: N/A;   COLONOSCOPY WITH PROPOFOL N/A 08/06/2020   Procedure: COLONOSCOPY WITH PROPOFOL;  Surgeon: Lin Landsman, MD;  Location: [facility] ENDOSCOPY;  Service: Gastroenterology;  Laterality: N/A;   JOINT REPLACEMENT Bilateral    LEG SURGERY     TOTAL KNEE ARTHROPLASTY Right      Prior to Admission medications   Medication Sig Start Date End Date Taking? Authorizing Provider  azithromycin (ZITHROMAX Z-PAK) 250 MG tablet Take 2 tablets (500 mg) on  Day 1,  followed by 1 tablet (250 mg) once daily on Days 2 through 5. 12/25/20  Yes Carrie Mew, MD  predniSONE (DELTASONE) 20 MG tablet Take 1 tablet (20 mg total) by mouth 2 (two) times daily with a meal for 3 days. 12/25/20 12/28/20 Yes Carrie Mew, MD  albuterol (PROVENTIL) (2.5  MG/3ML) 0.083% nebulizer solution Take 2.5 mg by nebulization every 4 (four) hours as needed for wheezing or shortness of breath.    [provider]  albuterol (VENTOLIN HFA) 108 (90 Base) MCG/ACT inhaler Inhale 2 puffs into the lungs every 4 (four) hours as needed for wheezing or shortness of breath. 12/25/20 01/24/21  Carrie Mew,  MD  atorvastatin (LIPITOR) 20 MG tablet Take 20 mg by mouth at bedtime. 04/03/19   [provider]  baclofen (LIORESAL) 10 MG tablet Take 1-2 tablets (10-20 mg total) by mouth 3 (three) times daily for 30 days. Patient taking differently: Take 10 mg by mouth 3 (three) times daily. 10/12/18 05/30/28  Milinda Pointer, MD  CALCIUM PO Take 1 tablet by mouth daily.    [provider]  celecoxib (CELEBREX) 200 MG capsule Take 1 capsule (200 mg total) by mouth 2 (two) times daily for 30 days. Patient taking differently: Take 400 mg by mouth 2 (two) times daily. 10/12/18 05/30/28  Milinda Pointer, MD  Cyanocobalamin (B-12 PO) Take 1 tablet by mouth daily.    [provider]  DULoxetine (CYMBALTA) 60 MG capsule Take 60 mg by mouth daily. 07/22/20   [provider]  FLOMAX 0.4 MG CAPS capsule Take 0.4 mg by mouth daily.  11/03/18   [provider]  furosemide (LASIX) 20 MG tablet Take 20 mg by mouth daily. Patient not taking: Reported on 10/21/2020    [provider]  gabapentin (NEURONTIN) 300 MG capsule Start by taking 1 cap (300 mg) PO HS. Increase by 1 cap every 7 days until taking 3 cap (900 mg) PO HS. Patient taking differently: Take 600 mg by mouth at bedtime. 11/07/18 05/30/28  Milinda Pointer, MD  MAGNESIUM PO Take 1 tablet by mouth daily.    [provider]  methocarbamol (ROBAXIN) 750 MG tablet Take 750 mg by mouth at bedtime. 08/08/20   [provider]  OVER THE COUNTER MEDICATION Apply 1 application topically daily as needed (pain). CBD cream    [provider]     Allergies Vancomycin and Acetaminophen   Family History  Problem Relation Age of Onset   Cancer Mother    Hyperlipidemia Son    Cancer Maternal Uncle    Cancer Maternal Grandfather     Social History Social History   Tobacco Use   Smoking status: Some Days    Types: Cigars   Smokeless tobacco: Never  Vaping Use   Vaping Use: Never used   Substance Use Topics   Alcohol use: Yes    Comment: 1/5 a week   Drug use: Yes    Types: Marijuana    Comment: 08/05/20    Review of Systems  Constitutional:   No fever or chills.  ENT:   No sore throat. No rhinorrhea. Cardiovascular:   Positive chest pain as above without syncope. Respiratory:   Positive shortness of breath without cough. Gastrointestinal:   Negative for abdominal pain, vomiting and diarrhea.  Musculoskeletal:   Negative for focal pain or swelling All other systems reviewed and are negative except as documented above in ROS and HPI.  ____________________________________________   PHYSICAL EXAM:  VITAL SIGNS: ED Triage Vitals  Enc Vitals Group     BP 12/25/20 1033 (!) 147/77     Pulse Rate 12/25/20 1033 (!) 56     Resp 12/25/20 1033 17     Temp 12/25/20 1033 98.2 F (36.8 C)     Temp Source 12/25/20 1033 Oral  SpO2 12/25/20 1033 96 %     Weight 12/25/20 1039 279 lb 15.8 oz (127 kg)     Height 12/25/20 1039 '5\' 11"'$  (1.803 m)     Head Circumference --      Peak Flow --      Pain Score 12/25/20 1038 4     Pain Loc --      Pain Edu? --      Excl. in [place]? --     Vital signs reviewed, nursing assessments reviewed.   Constitutional:   Alert and oriented. Non-toxic appearance. Eyes:   Conjunctivae are normal. EOMI. PERRL. ENT      Head:   Normocephalic and atraumatic.      Nose:   Wearing a mask.      Mouth/Throat:   Wearing a mask.      Neck:   No meningismus. Full ROM. Hematological/Lymphatic/Immunilogical:   No cervical lymphadenopathy. Cardiovascular:   RRR. Symmetric bilateral radial and DP pulses.  No murmurs. Cap refill less than 2 seconds. Respiratory:   Normal respiratory effort without tachypnea/retractions.  There is diffuse expiratory wheezing.  FEV1 maneuver provokes accentuated wheezing and coughing.. Gastrointestinal:   Soft and nontender. Non distended. There is no CVA tenderness.  No rebound, rigidity, or guarding. Genitourinary:    deferred Musculoskeletal:   Normal range of motion in all extremities. No joint effusions.  No lower extremity tenderness.  No edema. Neurologic:   Normal speech and language.  Motor grossly intact. No acute focal neurologic deficits are appreciated.  Skin:    Skin is warm, dry and intact. No rash noted.  No petechiae, purpura, or bullae.  ____________________________________________    LABS (pertinent positives/negatives) (all labs ordered are listed, but only abnormal results are displayed) Labs Reviewed  CBC - Abnormal; Notable for the following components:      Result Value   RBC 4.21 (*)    RDW 15.9 (*)    All other components within normal limits  COMPREHENSIVE METABOLIC PANEL - Abnormal; Notable for the following components:   Glucose, Bld 67 (*)    All other components within normal limits  TROPONIN I (HIGH SENSITIVITY)  TROPONIN I (HIGH SENSITIVITY)   ____________________________________________   EKG  Interpreted by me Sinus bradycardia rate of 55, normal axis and intervals.  Poor R wave progression.  Normal ST segments and T waves.  No ischemic changes.  ____________________________________________    RADIOLOGY  DG Chest 2 View  Result Date: 12/25/2020 CLINICAL DATA:  Chest pain.  History of asthma. EXAM: CHEST - 2 VIEW COMPARISON:  Report 04/03/1995 FINDINGS: Patient rotated right. Midline trachea. Mild cardiomegaly. Mediastinal contours otherwise within normal limits. No pleural effusion or pneumothorax. Diffuse peribronchial thickening. EKG lead or calcified granuloma projecting over the left lung base. IMPRESSION: No acute cardiopulmonary disease. Mild cardiomegaly. Peribronchial thickening which may relate to chronic bronchitis or smoking. Electronically Signed   By: Abigail Miyamoto M.D.   On: 12/25/2020 11:19    ____________________________________________   PROCEDURES Procedures  ____________________________________________  DIFFERENTIAL DIAGNOSIS    COPD exacerbation, pneumonia, non-STEMI, GERD  CLINICAL IMPRESSION / ASSESSMENT AND PLAN / ED COURSE  Medications ordered in the ED: Medications  ipratropium-albuterol (DUONEB) 0.5-2.5 (3) MG/3ML nebulizer solution 3 mL (3 mLs Nebulization Given 12/25/20 1310)    Pertinent labs & imaging results that were available during my care of the patient were reviewed by me and considered in my medical decision making (see chart for details).  Jack Blanchard was evaluated in Emergency  Department on 12/25/2020 for the symptoms described in the history of present illness. He was evaluated in the context of the global COVID-19 pandemic, which necessitated consideration that the patient might be at risk for infection with the SARS-CoV-2 virus that causes COVID-19. Institutional protocols and algorithms that pertain to the evaluation of patients at risk for COVID-19 are in a state of rapid change based on information released by regulatory bodies including the CDC and federal and state organizations. These policies and algorithms were followed during the patient's care in the ED.   Patient presents with atypical chest pain.  Doubt ACS PE dissection or aneurysm.  He is nontoxic.  Clinically this appears to be due to bronchospasm.  Patient given a DuoNeb and feeling much better.  Wheezing improved afterward and shortness of breath and chest pain resolved.  Serial troponins are normal.  Presentation is not compatible with ACS, he stable for discharge home with COPD treatment.  We will refill his albuterol, prednisone and azithromycin..      ____________________________________________   FINAL CLINICAL IMPRESSION(S) / ED DIAGNOSES    Final diagnoses:  Atypical chest pain  COPD exacerbation [facility])     ED Discharge Orders          Ordered    albuterol (VENTOLIN HFA) 108 (90 Base) MCG/ACT inhaler  Every 4 hours PRN        12/25/20 1610    azithromycin (ZITHROMAX Z-PAK) 250 MG tablet        12/25/20 1610     predniSONE (DELTASONE) 20 MG tablet  2 times daily with meals        12/25/20 1610            Portions of this note were generated with dragon dictation software. Dictation errors may occur despite best attempts at proofreading.    Carrie Mew, MD 12/25/20 1623

## 2021-01-15 ENCOUNTER — Other Ambulatory Visit: Payer: Self-pay

## 2021-01-15 ENCOUNTER — Encounter: Payer: Self-pay | Admitting: Urology

## 2021-01-15 ENCOUNTER — Ambulatory Visit (INDEPENDENT_AMBULATORY_CARE_PROVIDER_SITE_OTHER): Payer: Medicare Other | Admitting: Urology

## 2021-01-15 VITALS — BP 133/81 | HR 81 | Ht 71.0 in | Wt 280.0 lb

## 2021-01-15 DIAGNOSIS — R339 Retention of urine, unspecified: Secondary | ICD-10-CM

## 2021-01-15 DIAGNOSIS — N138 Other obstructive and reflux uropathy: Secondary | ICD-10-CM

## 2021-01-15 DIAGNOSIS — N401 Enlarged prostate with lower urinary tract symptoms: Secondary | ICD-10-CM

## 2021-01-15 DIAGNOSIS — R972 Elevated prostate specific antigen [PSA]: Secondary | ICD-10-CM

## 2021-01-15 DIAGNOSIS — R351 Nocturia: Secondary | ICD-10-CM

## 2021-01-15 LAB — BLADDER SCAN AMB NON-IMAGING: SCA Result: 307

## 2021-01-15 MED ORDER — FLOMAX 0.4 MG PO CAPS: 0.4000 mg | ORAL_CAPSULE | Freq: Two times a day (BID) | ORAL | 0 refills | Status: DC

## 2021-01-15 NOTE — Progress Notes (Signed)
01/15/2021 12:56 PM   Jack Blanchard Alveta Heimlich 1951-01-01 [id number]  Referring provider: Theotis Burrow, MD [address] [place],  [place] [address]  Chief Complaint  Patient presents with   Elevated PSA    HPI: Jack Blanchard is a 70 y.o. male referred for evaluation of incomplete bladder emptying.  On tamsulosin ~ for years for BPH Worsening lower urinary tract symptoms the past 2 years consisting of urinary frequency, urgency, weak urinary stream, hesitancy and occasional urge incontinence IPSS 23/25 Denies prior urologic evaluation Denies dysuria, gross hematuria No flank, abdominal or pelvic pain PSA levels 3.26 May 2017; 5.19 July 2019; 5.6 08/29/2020    PMH: Past Medical History:  Diagnosis Date   Asthma    Back pain    Fibromyalgia    History of degenerative disc disease     Surgical History: Past Surgical History:  Procedure Laterality Date   BACK SURGERY     lumbar surgery with rods placed   COLONOSCOPY WITH PROPOFOL N/A 08/03/2017   Procedure: COLONOSCOPY WITH PROPOFOL;  Surgeon: Lin Landsman, MD;  Location: [place];  Service: Gastroenterology;  Laterality: N/A;   COLONOSCOPY WITH PROPOFOL N/A 08/06/2020   Procedure: COLONOSCOPY WITH PROPOFOL;  Surgeon: Lin Landsman, MD;  Location: [facility] ENDOSCOPY;  Service: Gastroenterology;  Laterality: N/A;   JOINT REPLACEMENT Bilateral    LEG SURGERY     TOTAL KNEE ARTHROPLASTY Right     Home Medications:  Allergies as of 01/15/2021       Reactions   Vancomycin Itching, Swelling   angioedema   Acetaminophen Palpitations        Medication List        Accurate as of January 15, 2021 11:59 PM. If you have any questions, ask your nurse or doctor.          albuterol (2.5 MG/3ML) 0.083% nebulizer solution Commonly known as: PROVENTIL Take 2.5 mg by nebulization every 4 (four) hours as needed for wheezing or shortness of breath.   albuterol 108 (90 Base) MCG/ACT inhaler Commonly  known as: VENTOLIN HFA Inhale 2 puffs into the lungs every 4 (four) hours as needed for wheezing or shortness of breath.   atorvastatin 20 MG tablet Commonly known as: LIPITOR Take 20 mg by mouth at bedtime.   azithromycin 250 MG tablet Commonly known as: Zithromax Z-Pak Take 2 tablets (500 mg) on  Day 1,  followed by 1 tablet (250 mg) once daily on Days 2 through 5.   B-12 PO Take 1 tablet by mouth daily.   baclofen 10 MG tablet Commonly known as: LIORESAL Take 1-2 tablets (10-20 mg total) by mouth 3 (three) times daily for 30 days. What changed: how much to take   CALCIUM PO Take 1 tablet by mouth daily.   celecoxib 200 MG capsule Commonly known as: CELEBREX Take 1 capsule (200 mg total) by mouth 2 (two) times daily for 30 days. What changed: how much to take   DULoxetine 60 MG capsule Commonly known as: CYMBALTA Take 60 mg by mouth daily.   Flomax 0.4 MG Caps capsule Generic drug: tamsulosin Take 1 capsule (0.4 mg total) by mouth in the morning and at bedtime. What changed: when to take this Changed by: Abbie Sons, MD   furosemide 20 MG tablet Commonly known as: LASIX Take 20 mg by mouth daily.   gabapentin 300 MG capsule Commonly known as: Neurontin Start by taking 1 cap (300 mg) PO HS. Increase by 1 cap every  7 days until taking 3 cap (900 mg) PO HS. What changed:  how much to take how to take this when to take this additional instructions   gabapentin 600 MG tablet Commonly known as: NEURONTIN Take 600 mg by mouth 3 (three) times daily. What changed: Another medication with the same name was changed. Make sure you understand how and when to take each.   MAGNESIUM PO Take 1 tablet by mouth daily.   methocarbamol 750 MG tablet Commonly known as: ROBAXIN Take 750 mg by mouth at bedtime.   OVER THE COUNTER MEDICATION Apply 1 application topically daily as needed (pain). CBD cream        Allergies:  Allergies  Allergen Reactions    Vancomycin Itching and Swelling    angioedema   Acetaminophen Palpitations    Family History: Family History  Problem Relation Age of Onset   Cancer Mother    Hyperlipidemia Son    Cancer Maternal Uncle    Cancer Maternal Grandfather     Social History:  reports that he has been smoking cigars. He has never used smokeless tobacco. He reports current alcohol use. He reports current drug use. Drug: Marijuana.   Physical Exam: BP 133/81   Pulse 81   Ht '5\' 11"'$  (1.803 m)   Wt 280 lb (127 kg)   BMI 39.05 kg/m   Constitutional:  Alert and oriented, No acute distress. HEENT: [place] AT, moist mucus membranes.  Trachea midline, no masses. Cardiovascular: No clubbing, cyanosis, or edema. Respiratory: Normal respiratory effort, no increased work of breathing. GI: Abdomen is soft, nontender, nondistended, no abdominal masses GU: Prostate 35 g, smooth; upper one half not palpable secondary to body habitus Neurologic: Grossly intact, no focal deficits, moving all 4 extremities. Psychiatric: Normal mood and affect.   Assessment & Plan:    1.  BPH with LUTS Severe LUTS on tamsulosin 0.4 mg Bladder scan PVR 307 mL Increase tamsulosin 0.8 mg 1 month follow-up for repeat PVR/IPSS  2.  Elevated PSA Although PSA is a prostate cancer screening test he was informed that cancer is not the most common cause of an elevated PSA. Other potential causes including BPH and inflammation were discussed. He was informed that the only way to adequately diagnose prostate cancer would be a transrectal ultrasound and biopsy of the prostate. The procedure was discussed including potential risks of bleeding and infection/sepsis. He was also informed that a negative biopsy does not conclusively rule out the possibility that prostate cancer may be present and that continued monitoring is required. The use of newer adjunctive blood tests including PHI and 4kScore were discussed. The use of multiparametric prostate MRI  was also discussed however is not typically used for initial evaluation of an elevated PSA. Continued periodic surveillance was also discussed. Repeat PSA 1 month   Abbie Sons, MD  [facility] [address] [place], [place] [address] [phone]

## 2021-01-16 ENCOUNTER — Encounter: Payer: Self-pay | Admitting: Urology

## 2021-01-21 ENCOUNTER — Other Ambulatory Visit: Payer: Self-pay | Admitting: *Deleted

## 2021-01-21 MED ORDER — TAMSULOSIN HCL 0.4 MG PO CAPS: 0.4000 mg | ORAL_CAPSULE | Freq: Two times a day (BID) | ORAL | 3 refills | Status: DC

## 2021-02-06 ENCOUNTER — Other Ambulatory Visit: Payer: Self-pay | Admitting: Urology

## 2021-02-13 ENCOUNTER — Ambulatory Visit: Payer: Medicare Other | Admitting: Urology

## 2021-02-17 ENCOUNTER — Encounter: Payer: Self-pay | Admitting: Urology

## 2021-02-19 ENCOUNTER — Institutional Professional Consult (permissible substitution): Payer: Medicare Other | Admitting: Pulmonary Disease

## 2021-02-24 ENCOUNTER — Ambulatory Visit: Payer: Medicare Other | Admitting: Urology

## 2021-02-24 ENCOUNTER — Encounter: Payer: Self-pay | Admitting: Urology

## 2021-03-12 ENCOUNTER — Other Ambulatory Visit: Payer: Self-pay

## 2021-03-12 ENCOUNTER — Emergency Department
Admission: EM | Admit: 2021-03-12 | Discharge: 2021-03-12 | Discharge status: Against Medical Advice | Payer: Medicare Other | Attending: Emergency Medicine | Admitting: Emergency Medicine

## 2021-03-12 DIAGNOSIS — Z5321 Procedure and treatment not carried out due to patient leaving prior to being seen by health care provider: Secondary | ICD-10-CM | POA: Insufficient documentation

## 2021-03-12 NOTE — ED Triage Notes (Signed)
Pt was using q tips last night in left ear and this morning woke up and states that he couldn't hear well out of it

## 2021-03-12 NOTE — ED Provider Notes (Signed)
Patient eloped prior to my assessment.   Duffy Bruce, MD 03/12/21 1451

## 2021-03-13 ENCOUNTER — Institutional Professional Consult (permissible substitution): Payer: Medicare Other | Admitting: Internal Medicine

## 2021-03-13 NOTE — Progress Notes (Deleted)
   Jack Blanchard, male    DOB: Jan 12, 1951,   MRN: [id number]   Brief patient profile:  70 yo ***   *** referred to pulmonary clinic in [facility]  03/13/2021 by Dr Marland Kitchen  for ***          History of Present Illness  03/13/2021  Pulmonary/ 1st office eval/ Jack Blanchard / [employer]  No chief complaint on file.    Dyspnea:  *** Cough: *** Sleep: *** SABA use:   Past Medical History:  Diagnosis Date   Asthma    Back pain    Fibromyalgia    History of degenerative disc disease     Outpatient Medications Prior to Visit  Medication Sig Dispense Refill   albuterol (PROVENTIL) (2.5 MG/3ML) 0.083% nebulizer solution Take 2.5 mg by nebulization every 4 (four) hours as needed for wheezing or shortness of breath.     albuterol (VENTOLIN HFA) 108 (90 Base) MCG/ACT inhaler Inhale 2 puffs into the lungs every 4 (four) hours as needed for wheezing or shortness of breath. 1 each 0   atorvastatin (LIPITOR) 20 MG tablet Take 20 mg by mouth at bedtime.     azithromycin (ZITHROMAX Z-PAK) 250 MG tablet Take 2 tablets (500 mg) on  Day 1,  followed by 1 tablet (250 mg) once daily on Days 2 through 5. 6 each 0   baclofen (LIORESAL) 10 MG tablet Take 1-2 tablets (10-20 mg total) by mouth 3 (three) times daily for 30 days. (Patient taking differently: Take 10 mg by mouth 3 (three) times daily.) 180 tablet 0   CALCIUM PO Take 1 tablet by mouth daily.     celecoxib (CELEBREX) 200 MG capsule Take 1 capsule (200 mg total) by mouth 2 (two) times daily for 30 days. (Patient taking differently: Take 400 mg by mouth 2 (two) times daily.) 60 capsule 0   Cyanocobalamin (B-12 PO) Take 1 tablet by mouth daily.     DULoxetine (CYMBALTA) 60 MG capsule Take 60 mg by mouth daily.     furosemide (LASIX) 20 MG tablet Take 20 mg by mouth daily.     gabapentin (NEURONTIN) 300 MG capsule Start by taking 1 cap (300 mg) PO HS. Increase by 1 cap every 7 days until taking 3 cap (900 mg) PO HS. (Patient taking differently: Take 600 mg  by mouth at bedtime.) 90 capsule 0   gabapentin (NEURONTIN) 600 MG tablet Take 600 mg by mouth 3 (three) times daily.     MAGNESIUM PO Take 1 tablet by mouth daily.     methocarbamol (ROBAXIN) 750 MG tablet Take 750 mg by mouth at bedtime.     OVER THE COUNTER MEDICATION Apply 1 application topically daily as needed (pain). CBD cream     tamsulosin (FLOMAX) 0.4 MG CAPS capsule Take 1 capsule (0.4 mg total) by mouth 2 (two) times daily. 180 capsule 3   No facility-administered medications prior to visit.     Objective:     There were no vitals taken for this visit.         Assessment   No problem-specific Assessment & Plan notes found for this encounter.     Jack Gully, MD 03/13/2021

## 2021-03-17 ENCOUNTER — Other Ambulatory Visit: Payer: Self-pay

## 2021-03-17 ENCOUNTER — Ambulatory Visit (INDEPENDENT_AMBULATORY_CARE_PROVIDER_SITE_OTHER): Payer: Medicare Other | Admitting: Urology

## 2021-03-17 ENCOUNTER — Encounter: Payer: Self-pay | Admitting: Urology

## 2021-03-17 VITALS — BP 125/72 | HR 64 | Ht 72.0 in | Wt 280.0 lb

## 2021-03-17 DIAGNOSIS — N5319 Other ejaculatory dysfunction: Secondary | ICD-10-CM | POA: Diagnosis not present

## 2021-03-17 DIAGNOSIS — N401 Enlarged prostate with lower urinary tract symptoms: Secondary | ICD-10-CM

## 2021-03-17 DIAGNOSIS — R972 Elevated prostate specific antigen [PSA]: Secondary | ICD-10-CM

## 2021-03-17 DIAGNOSIS — N138 Other obstructive and reflux uropathy: Secondary | ICD-10-CM

## 2021-03-17 LAB — BLADDER SCAN AMB NON-IMAGING: Scan Result: 33

## 2021-03-17 MED ORDER — MIRABEGRON ER 50 MG PO TB24: 50.0000 mg | ORAL_TABLET | Freq: Every day | ORAL | 0 refills | Status: AC

## 2021-03-17 NOTE — Progress Notes (Signed)
03/17/2021 3:35 PM   Jack Blanchard 1950-06-26 [id number]  Referring provider: Theotis Burrow, MD [address] [place],  [place] [address]  Chief Complaint  Patient presents with   Benign Prostatic Hypertrophy    HPI: 70 y.o. male presents for follow-up visit.  Initially seen 01/15/2021 with severe lower urinary tract symptoms (IPSS 23/55) and an elevated PSA Had been on tamsulosin for several years which was titrated to 0.8 mg PVR at that visit was 307 mL He did not see significant improvement in his symptoms with titration of tamsulosin He also complains of difficulty with ejaculation and no semen with orgasm Uncertain if this occurred after titrating the tamsulosin \\Most  bothersome symptoms at this point are urgency with occasional urge incontinence and postvoid dribbling   PMH: Past Medical History:  Diagnosis Date   Asthma    Back pain    Fibromyalgia    History of degenerative disc disease     Surgical History: Past Surgical History:  Procedure Laterality Date   BACK SURGERY     lumbar surgery with rods placed   COLONOSCOPY WITH PROPOFOL N/A 08/03/2017   Procedure: COLONOSCOPY WITH PROPOFOL;  Surgeon: Lin Landsman, MD;  Location: [facility] ENDOSCOPY;  Service: Gastroenterology;  Laterality: N/A;   COLONOSCOPY WITH PROPOFOL N/A 08/06/2020   Procedure: COLONOSCOPY WITH PROPOFOL;  Surgeon: Lin Landsman, MD;  Location: [facility] ENDOSCOPY;  Service: Gastroenterology;  Laterality: N/A;   JOINT REPLACEMENT Bilateral    LEG SURGERY     TOTAL KNEE ARTHROPLASTY Right     Home Medications:  Allergies as of 03/17/2021       Reactions   Doxycycline Calcium    Other reaction(s): Unknown   Vancomycin Itching, Swelling   angioedema   Acetaminophen Palpitations        Medication List        Accurate as of March 17, 2021  3:35 PM. If you have any questions, ask your nurse or doctor.          albuterol (2.5 MG/3ML) 0.083% nebulizer  solution Commonly known as: PROVENTIL Take 2.5 mg by nebulization every 4 (four) hours as needed for wheezing or shortness of breath.   albuterol 108 (90 Base) MCG/ACT inhaler Commonly known as: VENTOLIN HFA Inhale 2 puffs into the lungs every 4 (four) hours as needed for wheezing or shortness of breath.   atorvastatin 20 MG tablet Commonly known as: LIPITOR Take 20 mg by mouth at bedtime.   azithromycin 250 MG tablet Commonly known as: Zithromax Z-Pak Take 2 tablets (500 mg) on  Day 1,  followed by 1 tablet (250 mg) once daily on Days 2 through 5.   B-12 PO Take 1 tablet by mouth daily.   baclofen 10 MG tablet Commonly known as: LIORESAL Take 1-2 tablets (10-20 mg total) by mouth 3 (three) times daily for 30 days. What changed: how much to take   CALCIUM PO Take 1 tablet by mouth daily.   cefUROXime 500 MG tablet Commonly known as: CEFTIN Take by mouth.   celecoxib 200 MG capsule Commonly known as: CELEBREX Take 1 capsule (200 mg total) by mouth 2 (two) times daily for 30 days. What changed: how much to take   DULoxetine 60 MG capsule Commonly known as: CYMBALTA Take 60 mg by mouth daily.   furosemide 20 MG tablet Commonly known as: LASIX Take 20 mg by mouth daily.   gabapentin 300 MG capsule Commonly known as: Neurontin Start by taking 1 cap (  300 mg) PO HS. Increase by 1 cap every 7 days until taking 3 cap (900 mg) PO HS. What changed:  how much to take how to take this when to take this additional instructions   gabapentin 600 MG tablet Commonly known as: NEURONTIN Take 600 mg by mouth 3 (three) times daily. What changed: Another medication with the same name was changed. Make sure you understand how and when to take each.   MAGNESIUM PO Take 1 tablet by mouth daily.   methocarbamol 750 MG tablet Commonly known as: ROBAXIN Take 750 mg by mouth at bedtime.   OVER THE COUNTER MEDICATION Apply 1 application topically daily as needed (pain). CBD  cream   tamsulosin 0.4 MG Caps capsule Commonly known as: FLOMAX Take 1 capsule (0.4 mg total) by mouth 2 (two) times daily.   tiZANidine 4 MG tablet Commonly known as: ZANAFLEX Take 4 mg by mouth at bedtime.        Allergies:  Allergies  Allergen Reactions   Doxycycline Calcium     Other reaction(s): Unknown   Vancomycin Itching and Swelling    angioedema   Acetaminophen Palpitations    Family History: Family History  Problem Relation Age of Onset   Cancer Mother    Hyperlipidemia Son    Cancer Maternal Uncle    Cancer Maternal Grandfather     Social History:  reports that he has been smoking cigars. He has never used smokeless tobacco. He reports current alcohol use. He reports current drug use. Drug: Marijuana.   Physical Exam: BP 125/72   Pulse 64   Ht 6' (1.829 m)   Wt 280 lb (127 kg)   BMI 37.97 kg/m   Constitutional:  Alert and oriented, No acute distress. HEENT: [place] AT, moist mucus membranes.  Trachea midline, no masses. Cardiovascular: No clubbing, cyanosis, or edema. Respiratory: Normal respiratory effort, no increased work of breathing. Psychiatric: Normal mood and affect.   Assessment & Plan:    1. BPH with obstruction/lower urinary tract symptoms PVR significantly improved at 33 mL though he does not feel voiding symptoms have improved Most bothersome symptoms are currently storage related and will give a trial of Myrbetriq 50 mg daily-samples given  2.  Ejaculatory dysfunction May be secondary to increased tamsulosin dose and will have him go back to 0.4 mg  3.  Elevated PSA Repeat PSA today  1 month follow-up for recheck   Abbie Sons, [place] [address] [place], [place] [address] [phone]

## 2021-03-18 LAB — PSA: Prostate Specific Ag, Serum: 4.4 ng/mL — ABNORMAL HIGH (ref 0.0–4.0)

## 2021-03-19 ENCOUNTER — Telehealth: Payer: Self-pay | Admitting: *Deleted

## 2021-03-19 NOTE — Telephone Encounter (Signed)
-----   Message from Abbie Sons, MD sent at 03/18/2021  5:02 PM EDT ----- Repeat PSA much better at 4.4-follow-up as scheduled

## 2021-03-19 NOTE — Telephone Encounter (Signed)
Notified patient as instructed, patient pleased. Discussed follow-up appointments, patient agrees  

## 2021-04-17 ENCOUNTER — Other Ambulatory Visit: Payer: Self-pay | Admitting: Urology

## 2021-04-23 ENCOUNTER — Encounter: Payer: Self-pay | Admitting: Urology

## 2021-04-23 ENCOUNTER — Ambulatory Visit: Payer: Medicare Other | Admitting: Urology

## 2021-09-26 ENCOUNTER — Telehealth: Payer: Self-pay | Admitting: Cardiovascular Disease

## 2021-09-26 NOTE — Telephone Encounter (Signed)
3 attempts to schedule fu appt from recall list.   Deleting recall.   

## 2022-01-25 ENCOUNTER — Other Ambulatory Visit: Payer: Self-pay | Admitting: Urology

## 2022-02-17 ENCOUNTER — Encounter: Payer: Self-pay | Admitting: Orthopedic Surgery

## 2022-02-17 ENCOUNTER — Other Ambulatory Visit: Payer: Self-pay | Admitting: Orthopedic Surgery

## 2022-02-17 DIAGNOSIS — Z01818 Encounter for other preprocedural examination: Secondary | ICD-10-CM

## 2022-02-17 NOTE — H&P (Signed)
  NAME: Jack Blanchard MRN:   [id number] DOB:   12-Mar-1951     HISTORY AND PHYSICAL  CHIEF COMPLAINT:  right hip pain  HISTORY:   Jack Blanchard a 71 y.o. male  with right  Hip Pain Patient complains of right hip pain. Onset of the symptoms was several years ago. Inciting event: known DJD. The patient reports the hip pain is worse with weight bearing. Associated symptoms: none. Aggravating symptoms include: any weight bearing. Patient has had no prior hip problems. Previous visits for this problem: multiple, this is a longstanding diagnosis. Last seen several weeks ago by me. Evaluation to date: plain films, which were abnormal  osteoarthritis . Treatment to date: OTC analgesics, which have been somewhat effective, prescription analgesics, which have been somewhat effective, and home exercise program, which has been somewhat effective.    Plan for right total hip replacement  PAST MEDICAL HISTORY:   Past Medical History:  Diagnosis Date   Asthma    Back pain    Fibromyalgia    History of degenerative disc disease     PAST SURGICAL HISTORY:   Past Surgical History:  Procedure Laterality Date   BACK SURGERY     lumbar surgery with rods placed   COLONOSCOPY WITH PROPOFOL N/A 08/03/2017   Procedure: COLONOSCOPY WITH PROPOFOL;  Surgeon: Lin Landsman, MD;  Location: [facility] ENDOSCOPY;  Service: Gastroenterology;  Laterality: N/A;   COLONOSCOPY WITH PROPOFOL N/A 08/06/2020   Procedure: COLONOSCOPY WITH PROPOFOL;  Surgeon: Lin Landsman, MD;  Location: [facility] ENDOSCOPY;  Service: Gastroenterology;  Laterality: N/A;   JOINT REPLACEMENT Bilateral    LEG SURGERY     TOTAL KNEE ARTHROPLASTY Right     MEDICATIONS:  (Not in a hospital admission)   ALLERGIES:   Allergies  Allergen Reactions   Doxycycline Calcium     Other reaction(s): Unknown   Vancomycin Itching and Swelling    angioedema   Acetaminophen Palpitations    REVIEW OF SYSTEMS:   Negative except HPI  FAMILY HISTORY:    Family History  Problem Relation Age of Onset   Cancer Mother    Hyperlipidemia Son    Cancer Maternal Uncle    Cancer Maternal Grandfather     SOCIAL HISTORY:   reports that he has been smoking cigars. He has never used smokeless tobacco. He reports current alcohol use. He reports current drug use. Drug: Marijuana.  PHYSICAL EXAM:  General appearance: alert, cooperative, and no distress Neck: no JVD and supple, symmetrical, trachea midline Resp: clear to auscultation bilaterally Cardio: regular rate and rhythm, S1, S2 normal, no murmur, click, rub or gallop GI: soft, non-tender; bowel sounds normal; no masses,  no organomegaly Extremities: extremities normal, atraumatic, no cyanosis or edema and Homans sign is negative, no sign of DVT Pulses: 2+ and symmetric Skin: Skin color, texture, turgor normal. No rashes or lesions    LABORATORY STUDIES: No results for input(s): "WBC", "HGB", "HCT", "PLT" in the last 72 hours.  No results for input(s): "NA", "K", "CL", "CO2", "GLUCOSE", "BUN", "CREATININE", "CALCIUM" in the last 72 hours.  STUDIES/RESULTS:  No results found.  ASSESSMENT:  End stage osteoarthritis right hip        Active Problems:   * No active hospital problems. *    PLAN:  Right Primary Total Hip   Carlynn Spry 02/17/2022. 7:48 AM

## 2022-02-26 ENCOUNTER — Inpatient Hospital Stay
Admission: RE | Admit: 2022-02-26 | Discharge: 2022-02-26 | Disposition: A | Discharge status: Home / Self Care | Payer: Medicare Other | Source: Ambulatory Visit

## 2022-02-26 HISTORY — DX: Polyneuropathy, unspecified: G62.9

## 2022-02-26 HISTORY — DX: Tobacco use: Z72.0

## 2022-02-26 HISTORY — DX: Cannabis use, unspecified, uncomplicated: F12.90

## 2022-02-26 HISTORY — DX: Vitamin D deficiency, unspecified: E55.9

## 2022-02-26 HISTORY — DX: Other intervertebral disc degeneration, lumbosacral region: M51.37

## 2022-02-26 HISTORY — DX: Other intervertebral disc degeneration, lumbosacral region without mention of lumbar back pain or lower extremity pain: M51.379

## 2022-02-26 HISTORY — DX: Unspecified osteoarthritis, unspecified site: M19.90

## 2022-02-26 NOTE — Patient Instructions (Addendum)
Your procedure is scheduled on:03-10-22 Tuesday Report to the Registration Desk on the 1st floor of the [facility].Then proceed to the 2nd floor Surgery Desk To find out your arrival time, please call [phone] between 1PM - 3PM on:03-09-22 Monday If your arrival time is 6:00 am, do not arrive prior to that time as the [facility] entrance doors do not open until 6:00 am.  REMEMBER: Instructions that are not followed completely may result in serious medical risk, up to and including death; or upon the discretion of your surgeon and anesthesiologist your surgery may need to be rescheduled.  Do not eat food OR drink any liquids after midnight the night before surgery.  No gum chewing, lozengers or hard candies.  TAKE THESE MEDICATIONS THE MORNING OF SURGERY WITH A SIP OF WATER: -  One week prior to surgery: Stop Anti-inflammatories (NSAIDS) such as Advil, Aleve, Ibuprofen, Motrin, Naproxen, Naprosyn and Aspirin based products such as Excedrin, Goodys Powder, BC Powder.You may however, continue to take Tylenol if needed for pain up until the day of surgery.  Stop ANY OVER THE COUNTER supplements/vitamins 7 days prior to surgery  No Alcohol for 24 hours before or after surgery.  No Smoking including e-cigarettes for 24 hours prior to surgery.  No chewable tobacco products for at least 6 hours prior to surgery.  No nicotine patches on the day of surgery.  Do not use any "recreational" drugs for at least a week prior to your surgery.  Please be advised that the combination of cocaine and anesthesia may have negative outcomes, up to and including death. If you test positive for cocaine, your surgery will be cancelled.  On the morning of surgery brush your teeth with toothpaste and water, you may rinse your mouth with mouthwash if you wish. Do not swallow any toothpaste or mouthwash.  Use CHG Soap as directed on instruction sheet.  Do not wear jewelry, make-up, hairpins, clips or  nail polish.  Do not wear lotions, powders, or perfumes.   Do not shave body from the neck down 48 hours prior to surgery just in case you cut yourself which could leave a site for infection.  Also, freshly shaved skin may become irritated if using the CHG soap.  Contact lenses, hearing aids and dentures may not be worn into surgery.  Do not bring valuables to the hospital. [facility] is not responsible for any missing/lost belongings or valuables.   Notify your doctor if there is any change in your medical condition (cold, fever, infection).  Wear comfortable clothing (specific to your surgery type) to the hospital.  After surgery, you can help prevent lung complications by doing breathing exercises.  Take deep breaths and cough every 1-2 hours. Your doctor may order a device called an Incentive Spirometer to help you take deep breaths. When coughing or sneezing, hold a pillow firmly against your incision with both hands. This is called "splinting." Doing this helps protect your incision. It also decreases belly discomfort.  If you are being admitted to the hospital overnight, leave your suitcase in the car. After surgery it may be brought to your room.  If you are being discharged the day of surgery, you will not be allowed to drive home. You will need a responsible adult (18 years or older) to drive you home and stay with you that night.   If you are taking public transportation, you will need to have a responsible adult (18 years or older) with you. Please  confirm with your physician that it is acceptable to use public transportation.   Please call the [facility] Dept. at [phone] if you have any questions about these instructions.  Surgery Visitation Policy:  Patients undergoing a surgery or procedure may have two family members or support persons with them as long as the person is not COVID-19 positive or experiencing its symptoms.   Inpatient Visitation:     Visiting hours are 7 a.m. to 8 p.m. Up to four visitors are allowed at one time in a patient room, including children. The visitors may rotate out with other people during the day. One designated support person (adult) may remain overnight.    How to Use an Incentive Spirometer An incentive spirometer is a tool that measures how well you are filling your lungs with each breath. Learning to take long, deep breaths using this tool can help you keep your lungs clear and active. This may help to reverse or lessen your chance of developing breathing (pulmonary) problems, especially infection. You may be asked to use a spirometer: After a surgery. If you have a lung problem or a history of smoking. After a long period of time when you have been unable to move or be active. If the spirometer includes an indicator to show the highest number that you have reached, your health care provider or respiratory therapist will help you set a goal. Keep a log of your progress as told by your health care provider. What are the risks? Breathing too quickly may cause dizziness or cause you to pass out. Take your time so you do not get dizzy or light-headed. If you are in pain, you may need to take pain medicine before doing incentive spirometry. It is harder to take a deep breath if you are having pain. How to use your incentive spirometer  Sit up on the edge of your bed or on a chair. Hold the incentive spirometer so that it is in an upright position. Before you use the spirometer, breathe out normally. Place the mouthpiece in your mouth. Make sure your lips are closed tightly around it. Breathe in slowly and as deeply as you can through your mouth, causing the piston or the ball to rise toward the top of the chamber. Hold your breath for 3-5 seconds, or for as long as possible. If the spirometer includes a coach indicator, use this to guide you in breathing. Slow down your breathing if the indicator goes above  the marked areas. Remove the mouthpiece from your mouth and breathe out normally. The piston or ball will return to the bottom of the chamber. Rest for a few seconds, then repeat the steps 10 or more times. Take your time and take a few normal breaths between deep breaths so that you do not get dizzy or light-headed. Do this every 1-2 hours when you are awake. If the spirometer includes a goal marker to show the highest number you have reached (best effort), use this as a goal to work toward during each repetition. After each set of 10 deep breaths, cough a few times. This will help to make sure that your lungs are clear. If you have an incision on your chest or abdomen from surgery, place a pillow or a rolled-up towel firmly against the incision when you cough. This can help to reduce pain while taking deep breaths and coughing. General tips When you are able to get out of bed: Walk around often. Continue to take  deep breaths and cough in order to clear your lungs. Keep using the incentive spirometer until your health care provider says it is okay to stop using it. If you have been in the hospital, you may be told to keep using the spirometer at home. Contact a health care provider if: You are having difficulty using the spirometer. You have trouble using the spirometer as often as instructed. Your pain medicine is not giving enough relief for you to use the spirometer as told. You have a fever. Get help right away if: You develop shortness of breath. You develop a cough with bloody mucus from the lungs. You have fluid or blood coming from an incision site after you cough. Summary An incentive spirometer is a tool that can help you learn to take long, deep breaths to keep your lungs clear and active. You may be asked to use a spirometer after a surgery, if you have a lung problem or a history of smoking, or if you have been inactive for a long period of time. Use your incentive spirometer  as instructed every 1-2 hours while you are awake. If you have an incision on your chest or abdomen, place a pillow or a rolled-up towel firmly against your incision when you cough. This will help to reduce pain. Get help right away if you have shortness of breath, you cough up bloody mucus, or blood comes from your incision when you cough. This information is not intended to replace advice given to you by your health care provider. Make sure you discuss any questions you have with your health care provider. Document Revised: 07/24/2019 Document Reviewed: 07/24/2019 Elsevier Patient Education  [place].

## 2022-03-03 ENCOUNTER — Encounter
Admission: RE | Admit: 2022-03-03 | Discharge: 2022-03-03 | Disposition: A | Discharge status: Home / Self Care | Payer: Medicare Other | Source: Ambulatory Visit | Attending: Orthopedic Surgery | Admitting: Orthopedic Surgery

## 2022-03-03 ENCOUNTER — Encounter: Payer: Self-pay | Admitting: Urgent Care

## 2022-03-03 ENCOUNTER — Other Ambulatory Visit: Payer: Self-pay

## 2022-03-03 ENCOUNTER — Encounter: Payer: Self-pay | Admitting: Orthopedic Surgery

## 2022-03-03 VITALS — BP 142/74 | Resp 16 | Ht 71.0 in | Wt 278.2 lb

## 2022-03-03 DIAGNOSIS — F1921 Other psychoactive substance dependence, in remission: Secondary | ICD-10-CM | POA: Diagnosis not present

## 2022-03-03 DIAGNOSIS — Z01818 Encounter for other preprocedural examination: Secondary | ICD-10-CM | POA: Diagnosis present

## 2022-03-03 DIAGNOSIS — Z01812 Encounter for preprocedural laboratory examination: Secondary | ICD-10-CM

## 2022-03-03 HISTORY — DX: Bradycardia, unspecified: R00.1

## 2022-03-03 HISTORY — DX: Dyspnea, unspecified: R06.00

## 2022-03-03 LAB — BASIC METABOLIC PANEL
Anion gap: 4 — ABNORMAL LOW (ref 5–15)
BUN: 12 mg/dL (ref 8–23)
CO2: 27 mmol/L (ref 22–32)
Calcium: 9.4 mg/dL (ref 8.9–10.3)
Chloride: 111 mmol/L (ref 98–111)
Creatinine, Ser: 0.72 mg/dL (ref 0.61–1.24)
GFR, Estimated: >60 mL/min (ref >60)
Glucose, Bld: 89 mg/dL (ref 70–99)
Potassium: 3.5 mmol/L (ref 3.5–5.1)
Sodium: 142 mmol/L (ref 135–145)

## 2022-03-03 LAB — URINALYSIS, ROUTINE W REFLEX MICROSCOPIC
Bilirubin Urine: NEGATIVE
Glucose, UA: NEGATIVE mg/dL
Ketones, ur: NEGATIVE mg/dL
Nitrite: NEGATIVE
Protein, ur: NEGATIVE mg/dL
Specific Gravity, Urine: 1.013 (ref 1.005–1.030)
WBC, UA: >50 WBC/hpf — ABNORMAL HIGH (ref 0–5)
pH: 5 (ref 5.0–8.0)

## 2022-03-03 LAB — CBC
HCT: 39.6 % (ref 39.0–52.0)
Hemoglobin: 13.3 g/dL (ref 13.0–17.0)
MCH: 32.2 pg (ref 26.0–34.0)
MCHC: 33.6 g/dL (ref 30.0–36.0)
MCV: 95.9 fL (ref 80.0–100.0)
Platelets: 223 10*3/uL (ref 150–400)
RBC: 4.13 MIL/uL — ABNORMAL LOW (ref 4.22–5.81)
RDW: 15.3 % (ref 11.5–15.5)
WBC: 6.7 10*3/uL (ref 4.0–10.5)
nRBC: 0 % (ref 0.0–0.2)

## 2022-03-03 LAB — URINE DRUG SCREEN, QUALITATIVE (ARMC ONLY)
Amphetamines, Ur Screen: NOT DETECTED
Barbiturates, Ur Screen: NOT DETECTED
Benzodiazepine, Ur Scrn: NOT DETECTED
Cannabinoid 50 Ng, Ur ~~LOC~~: POSITIVE — AB
Cocaine Metabolite,Ur ~~LOC~~: POSITIVE — AB
MDMA (Ecstasy)Ur Screen: NOT DETECTED
Methadone Scn, Ur: NOT DETECTED
Opiate, Ur Screen: NOT DETECTED
Phencyclidine (PCP) Ur S: NOT DETECTED
Tricyclic, Ur Screen: NOT DETECTED

## 2022-03-03 LAB — TYPE AND SCREEN
ABO/RH(D): B POS
Antibody Screen: NEGATIVE

## 2022-03-03 LAB — SURGICAL PCR SCREEN
MRSA, PCR: NEGATIVE
Staphylococcus aureus: NEGATIVE

## 2022-03-03 NOTE — Patient Instructions (Addendum)
Your procedure is scheduled on: 03/10/22 - Tuesday Report to the Registration Desk on the 1st floor of the [facility]. To find out your arrival time, please call [phone] between 1PM - 3PM on: 03/09/22 - Monday If your arrival time is 6:00 am, do not arrive prior to that time as the [facility] entrance doors do not open until 6:00 am.  REMEMBER: Instructions that are not followed completely may result in serious medical risk, up to and including death; or upon the discretion of your surgeon and anesthesiologist your surgery may need to be rescheduled.  Do not eat food or drink any fluids after midnight the night before surgery.  No gum chewing, lozengers or hard candies.  TAKE THESE MEDICATIONS THE MORNING OF SURGERY WITH A SIP OF WATER:  - DULoxetine (CYMBALTA)  - gabapentin (NEURONTIN)  - mirabegron ER (MYRBETRIQ) - tamsulosin (FLOMAX)  - budesonide-formoterol (SYMBICORT)  Use  albuterol (VENTOLIN HFA) 108 (90 Base) on the day of surgery and bring to the hospital.  One week prior to surgery: Stop Anti-inflammatories (NSAIDS) such as Advil, Aleve, Ibuprofen, Motrin, Naproxen, Naprosyn and Aspirin based products such as Excedrin, Goodys Powder, BC Powder.  Stop ANY OVER THE COUNTER supplements until after surgery.  No Alcohol for 24 hours before or after surgery.  No Smoking including e-cigarettes for 24 hours prior to surgery.  No chewable tobacco products for at least 6 hours prior to surgery.  No nicotine patches on the day of surgery.  Do not use any "recreational" drugs for at least a week prior to your surgery.  Please be advised that the combination of cocaine and anesthesia may have negative outcomes, up to and including death. If you test positive for cocaine, your surgery will be cancelled.  On the morning of surgery brush your teeth with toothpaste and water, you may rinse your mouth with mouthwash if you wish. Do not swallow any toothpaste or  mouthwash.  Use CHG Soap or wipes as directed on instruction sheet.  Do not wear jewelry, make-up, hairpins, clips or nail polish.  Do not wear lotions, powders, or perfumes.   Do not shave body from the neck down 48 hours prior to surgery just in case you cut yourself which could leave a site for infection.  Also, freshly shaved skin may become irritated if using the CHG soap.  Contact lenses, hearing aids and dentures may not be worn into surgery.  Do not bring valuables to the hospital. [facility] is not responsible for any missing/lost belongings or valuables.   Notify your doctor if there is any change in your medical condition (cold, fever, infection).  Wear comfortable clothing (specific to your surgery type) to the hospital.  After surgery, you can help prevent lung complications by doing breathing exercises.  Take deep breaths and cough every 1-2 hours. Your doctor may order a device called an Incentive Spirometer to help you take deep breaths. When coughing or sneezing, hold a pillow firmly against your incision with both hands. This is called "splinting." Doing this helps protect your incision. It also decreases belly discomfort.  If you are being admitted to the hospital overnight, leave your suitcase in the car. After surgery it may be brought to your room.  If you are being discharged the day of surgery, you will not be allowed to drive home. You will need a responsible adult (18 years or older) to drive you home and stay with you that night.   If you  are taking public transportation, you will need to have a responsible adult (18 years or older) with you. Please confirm with your physician that it is acceptable to use public transportation.   Please call the [facility] Dept. at [phone] if you have any questions about these instructions.  Surgery Visitation Policy:  Patients undergoing a surgery or procedure may have two family members or support  persons with them as long as the person is not COVID-19 positive or experiencing its symptoms.   Inpatient Visitation:    Visiting hours are 7 a.m. to 8 p.m. Up to four visitors are allowed at one time in a patient room, including children. The visitors may rotate out with other people during the day. One designated support person (adult) may remain overnight.

## 2022-03-03 NOTE — Progress Notes (Signed)
  [facility] Perioperative Services: Pre-Admission/Anesthesia Testing  Abnormal Lab Notification   Date: 03/03/22  Name: Jack Blanchard MRN:   [id number]  Re: Abnormal labs noted during PAT appointment   Notified:  Provider Name Provider Role Notification Mode  Lovell Sheehan, MD Orthopedics (Surgeon) Routed and/or faxed via [place] and Notes:  ABNORMAL LAB VALUE(S):   Tricyclic, Ur Screen 31/54/0086 NONE DETECTED    Amphetamines, Ur Screen 03/03/2022 NONE DETECTED    MDMA (Ecstasy)Ur Screen 03/03/2022 NONE DETECTED    Cocaine Metabolite,Ur [place] 03/03/2022 POSITIVE (A)    Opiate, Ur Screen 03/03/2022 NONE DETECTED    Phencyclidine (PCP) Ur S 03/03/2022 NONE DETECTED    Cannabinoid 41 Ng, Ur [place] 03/03/2022 POSITIVE (A)    Barbiturates, Ur Screen 03/03/2022 NONE DETECTED    Benzodiazepine, Ur Scrn 03/03/2022 NONE DETECTED    Methadone Scn, Ur 03/03/2022 NONE DETECTED    Jack Blanchard is scheduled for an elective TOTAL RIGHT HIP ARTHROPLASTY on 03/10/2022. Patient with a PMH (+) for polysubstance abuse. Patient initially denying recent use, however when advised UDS would be performed, he admitted to marijuana use. Preoperative UDS performed in order to determine what substances patient may have potentially ingested prior to upcoming elective orthopedic surgery.   Impression and Plan:  Patient was (+) for both cocaine and marijuana today. The main cocaine metabolite tested for in urine, benzoylecgonine, can be detected for up to +/- 10 days (average 7 days) following cocaine use. Cocaine use, in the setting of general anesthesia administration, places the patient at significantly increased risks of potential perioperative complications. Complications can include, but are not limited to, malignant hypertension, tachyarrhythmias/dysrhythmias, prolonged QTc intervals, myocardial ischemia/vasospasm, convulsions, aortic dissection, CVA, and premature death.    He was advised both verbally and in writing of the above. He was asked to refrain from further illegal substance use prior to surgery and in the immediate postoperative period. Encouraged cessation all together. Note being sent to primary attending surgeon to make him aware of the results and plans for repeat UDS on the day of surgery; order has been entered. Patient is aware that if he is cocaine (+) on the day of his procedure, given the elective nature, his surgery will be postponed pending surgeon discretion and commitment from the patient to refrain from use of illegal substances. Patient verbalized understanding of all of the aforementioned.   Results of this testing HAS NOT been discussed with patient, nor are any changes being made to the OR schedule at this point.    Honor Loh, MSN, APRN, FNP-C, CEN [facility]  Peri-operative Services Nurse Practitioner Phone: [phone] Fax: [phone] 03/03/22 11:15 AM

## 2022-03-04 ENCOUNTER — Telehealth: Payer: Self-pay | Admitting: Urgent Care

## 2022-03-04 DIAGNOSIS — Z01812 Encounter for preprocedural laboratory examination: Secondary | ICD-10-CM

## 2022-03-04 DIAGNOSIS — N390 Urinary tract infection, site not specified: Secondary | ICD-10-CM

## 2022-03-04 LAB — URINE CULTURE: Culture: 60000 — AB

## 2022-03-04 MED ORDER — AMOXICILLIN-POT CLAVULANATE 500-125 MG PO TABS: 1.0000 | ORAL_TABLET | Freq: Two times a day (BID) | ORAL | 0 refills | Status: DC

## 2022-03-04 NOTE — Progress Notes (Signed)
[facility] Perioperative Services: Pre-Admission/Anesthesia Testing  Abnormal Lab Notification and Treatment Plan of Care   Date: 03/04/22  Name: Jack Blanchard MRN:   [id number]  Re: Abnormal labs noted during PAT appointment   Notified:  Provider Name Provider Role Notification Mode  Kurtis Bushman, MD Orthopedics (Surgeon) Routed and/or faxed via [facility]   Abnormal Lab Value(s):   Lab Results  Component Value Date   COLORURINE YELLOW (A) 03/03/2022   APPEARANCEUR CLOUDY (A) 03/03/2022   LABSPEC 1.013 03/03/2022   PHURINE 5.0 03/03/2022   GLUCOSEU NEGATIVE 03/03/2022   HGBUR SMALL (A) 03/03/2022   BILIRUBINUR NEGATIVE 03/03/2022   KETONESUR NEGATIVE 03/03/2022   PROTEINUR NEGATIVE 03/03/2022   NITRITE NEGATIVE 03/03/2022   LEUKOCYTESUR LARGE (A) 03/03/2022   EPIU 0-5 03/03/2022   WBCU >50 (H) 03/03/2022   RBCU 6-10 03/03/2022   BACTERIA RARE (A) 03/03/2022   CULT (A) 03/03/2022    60,000 COLONIES/mL GROUP B STREP(S.AGALACTIAE)ISOLATED TESTING AGAINST S. AGALACTIAE NOT ROUTINELY PERFORMED DUE TO PREDICTABILITY OF AMP/PEN/VAN SUSCEPTIBILITY. Performed at [facility], [place] [address] [place], [place] [address]     Clinical Information and Notes:  Patient is scheduled for RIGHT TOTAL HIP ARTHROPLASTY on 03/10/2022.    UA performed in PAT consistent with/concerning for infection.  No leukocytosis noted on CBC; WBC 6700 Renal function: Estimated Creatinine Clearance: 116.3 mL/min (by C-G formula based on SCr of 0.72 mg/dL). Urine C&S added to assess for pathogenically significant growth.  Impression and Plan:  Jack Blanchard with a UA that was (+) for infection; reflex culture sent. Culture (+) for GBS. Contacted patient to discuss. Patient reporting that he is experiencing minor LUTS. Patient with surgery scheduled soon. In efforts to avoid delaying patient's procedure, or have him experience any potentially significant perioperative complications  related to the aforementioned, I would like to proceed with empiric treatment for urinary tract infection.  Allergies reviewed. Culture report also reviewed to ensure culture appropriate coverage is being provided. Will treat with a 5 day course of AUGMENTIN 500/125 mg. Patient encouraged to complete the entire course of antibiotics even if he begins to feel better. He was advised that if culture demonstrates resistance to the prescribed antibiotic, he will be contacted and advised of the need to change the antibiotic being used to treat his infection.   Meds ordered this encounter  Medications   amoxicillin-clavulanate (AUGMENTIN) 500-125 MG tablet    Sig: Take 1 tablet by mouth 2 (two) times daily.    Dispense:  10 tablet    Refill:  0   Patient encouraged to increase his fluid intake as much as possible. Discussed that water is always best to flush the urinary tract. He was advised to avoid caffeine containing fluids until his infections clears, as caffeine can cause him to experience painful bladder spasms.   May use Tylenol as needed for pain/fever should he experience these symptoms.   Patient instructed to call surgeon's office or PAT with any questions or concerns related to the above outlined course of treatment. Additionally, he was instructed to call if he feels like he is getting worse overall while on treatment. Results and treatment plan of care forwarded to primary attending surgeon to make them aware.   Encounter Diagnoses  Name Primary?   Pre-operative laboratory examination Yes   Group B streptococcal UTI    Honor Loh, MSN, APRN, FNP-C, CEN [facility]  Peri-operative Services Nurse Practitioner Phone: [phone] Fax: [phone] 03/04/22  6:00 PM  NOTE: This note has been prepared using [employer]. Despite my best ability to proofread, there is always the potential that unintentional transcriptional errors may still occur from  this process.

## 2022-03-05 ENCOUNTER — Encounter: Payer: Self-pay | Admitting: Urgent Care

## 2022-03-10 ENCOUNTER — Encounter: Payer: Self-pay | Admitting: Orthopedic Surgery

## 2022-03-10 ENCOUNTER — Other Ambulatory Visit: Payer: Self-pay

## 2022-03-10 ENCOUNTER — Ambulatory Visit
Admission: RE | Admit: 2022-03-10 | Discharge: 2022-03-11 | Disposition: A | Discharge status: Home / Self Care | Payer: Medicare Other | Attending: Orthopedic Surgery | Admitting: Orthopedic Surgery

## 2022-03-10 ENCOUNTER — Encounter
Admission: RE | Disposition: A | Discharge status: Home / Self Care | Payer: Self-pay | Source: Home / Self Care | Attending: Orthopedic Surgery

## 2022-03-10 DIAGNOSIS — F1490 Cocaine use, unspecified, uncomplicated: Secondary | ICD-10-CM | POA: Diagnosis not present

## 2022-03-10 DIAGNOSIS — Z01812 Encounter for preprocedural laboratory examination: Secondary | ICD-10-CM | POA: Insufficient documentation

## 2022-03-10 DIAGNOSIS — F1910 Other psychoactive substance abuse, uncomplicated: Secondary | ICD-10-CM | POA: Insufficient documentation

## 2022-03-10 DIAGNOSIS — R8290 Unspecified abnormal findings in urine: Secondary | ICD-10-CM | POA: Diagnosis not present

## 2022-03-10 DIAGNOSIS — R825 Elevated urine levels of drugs, medicaments and biological substances: Secondary | ICD-10-CM | POA: Diagnosis not present

## 2022-03-10 DIAGNOSIS — Z5309 Procedure and treatment not carried out because of other contraindication: Secondary | ICD-10-CM | POA: Insufficient documentation

## 2022-03-10 DIAGNOSIS — M1611 Unilateral primary osteoarthritis, right hip: Secondary | ICD-10-CM | POA: Insufficient documentation

## 2022-03-10 HISTORY — DX: Other psychoactive substance abuse, uncomplicated: F19.10

## 2022-03-10 LAB — ABO/RH: ABO/RH(D): B POS

## 2022-03-10 LAB — URINE DRUG SCREEN, QUALITATIVE (ARMC ONLY)
Amphetamines, Ur Screen: NOT DETECTED
Barbiturates, Ur Screen: NOT DETECTED
Benzodiazepine, Ur Scrn: NOT DETECTED
Cannabinoid 50 Ng, Ur ~~LOC~~: POSITIVE — AB
Cocaine Metabolite,Ur ~~LOC~~: POSITIVE — AB
MDMA (Ecstasy)Ur Screen: NOT DETECTED
Methadone Scn, Ur: NOT DETECTED
Opiate, Ur Screen: NOT DETECTED
Phencyclidine (PCP) Ur S: NOT DETECTED
Tricyclic, Ur Screen: NOT DETECTED

## 2022-03-10 SURGERY — ARTHROPLASTY, HIP, TOTAL, ANTERIOR APPROACH
Anesthesia: Spinal | Site: Hip | Laterality: Right

## 2022-03-10 MED ORDER — FAMOTIDINE 20 MG PO TABS
ORAL_TABLET | ORAL | Status: AC
  Administered 2022-03-10: 20 mg via ORAL
  Filled 2022-03-10: qty 1

## 2022-03-10 MED ORDER — CEFAZOLIN IN SODIUM CHLORIDE 3-0.9 GM/100ML-% IV SOLN
3.0000 g | INTRAVENOUS | Status: AC
  Filled 2022-03-10 (×3): qty 100

## 2022-03-10 MED ORDER — CHLORHEXIDINE GLUCONATE 0.12 % MT SOLN: 15.0000 mL | Freq: Once | OROMUCOSAL | Status: AC

## 2022-03-10 MED ORDER — POVIDONE-IODINE 10 % EX SWAB
2.0000 | Freq: Once | CUTANEOUS | Status: AC
  Administered 2022-03-10: 2 via TOPICAL

## 2022-03-10 MED ORDER — TRANEXAMIC ACID-NACL 1000-0.7 MG/100ML-% IV SOLN
INTRAVENOUS | Status: AC
  Filled 2022-03-10: qty 100

## 2022-03-10 MED ORDER — LACTATED RINGERS IV SOLN: INTRAVENOUS | Status: DC

## 2022-03-10 MED ORDER — FAMOTIDINE 20 MG PO TABS: 20.0000 mg | ORAL_TABLET | Freq: Once | ORAL | Status: AC

## 2022-03-10 MED ORDER — ORAL CARE MOUTH RINSE: 15.0000 mL | Freq: Once | OROMUCOSAL | Status: AC

## 2022-03-10 MED ORDER — CHLORHEXIDINE GLUCONATE 0.12 % MT SOLN
OROMUCOSAL | Status: AC
  Administered 2022-03-10: 15 mL via OROMUCOSAL
  Filled 2022-03-10: qty 15

## 2022-03-10 MED ORDER — TRANEXAMIC ACID-NACL 1000-0.7 MG/100ML-% IV SOLN: 1000.0000 mg | INTRAVENOUS | Status: AC

## 2022-03-10 MED ORDER — CEFAZOLIN SODIUM-DEXTROSE 2-4 GM/100ML-% IV SOLN
INTRAVENOUS | Status: AC
  Filled 2022-03-10: qty 100

## 2022-03-10 SURGICAL SUPPLY — 47 items
BLADE SAGITTAL WIDE XTHICK NO (BLADE) ×1 IMPLANT
BNDG COHESIVE 4X5 TAN STRL LF (GAUZE/BANDAGES/DRESSINGS) IMPLANT
BRUSH SCRUB EZ  4% CHG (MISCELLANEOUS) ×1
BRUSH SCRUB EZ 4% CHG (MISCELLANEOUS) ×1 IMPLANT
CHLORAPREP W/TINT 26 (MISCELLANEOUS) ×2 IMPLANT
DRAPE 3/4 80X56 (DRAPES) ×1 IMPLANT
DRAPE C-ARM 42X72 X-RAY (DRAPES) ×1 IMPLANT
DRAPE STERI IOBAN 125X83 (DRAPES) IMPLANT
DRAPE U-SHAPE 47X51 STRL (DRAPES) ×1 IMPLANT
DRSG AQUACEL AG ADV 3.5X10 (GAUZE/BANDAGES/DRESSINGS) IMPLANT
DRSG AQUACEL AG ADV 3.5X14 (GAUZE/BANDAGES/DRESSINGS) IMPLANT
ELECT REM PT RETURN 9FT ADLT (ELECTROSURGICAL) ×1
ELECTRODE REM PT RTRN 9FT ADLT (ELECTROSURGICAL) ×1 IMPLANT
GAUZE 4X4 16PLY ~~LOC~~+RFID DBL (SPONGE) ×1 IMPLANT
GAUZE XEROFORM 1X8 LF (GAUZE/BANDAGES/DRESSINGS) IMPLANT
GLOVE BIO SURGEON STRL SZ8 (GLOVE) ×1 IMPLANT
GLOVE BIOGEL PI IND STRL 8.5 (GLOVE) ×2 IMPLANT
GLOVE PI ORTHO PRO STRL SZ8 (GLOVE) ×2 IMPLANT
GOWN STRL REUS W/ TWL XL LVL3 (GOWN DISPOSABLE) ×2 IMPLANT
GOWN STRL REUS W/TWL XL LVL3 (GOWN DISPOSABLE) ×2
HOOD PEEL AWAY FLYTE STAYCOOL (MISCELLANEOUS) ×3 IMPLANT
IV NS 250ML (IV SOLUTION)
IV NS 250ML BAXH (IV SOLUTION) IMPLANT
IV NS IRRIG 3000ML ARTHROMATIC (IV SOLUTION) ×1 IMPLANT
KIT PATIENT CARE HANA TABLE (KITS) ×1 IMPLANT
KIT TURNOVER CYSTO (KITS) ×1 IMPLANT
MANIFOLD NEPTUNE II (INSTRUMENTS) ×1 IMPLANT
MAT ABSORB  FLUID 56X50 GRAY (MISCELLANEOUS) ×1
MAT ABSORB FLUID 56X50 GRAY (MISCELLANEOUS) ×1 IMPLANT
NDL SAFETY ECLIP 18X1.5 (MISCELLANEOUS) IMPLANT
NEEDLE SPNL 20GX3.5 QUINCKE YW (NEEDLE) ×1 IMPLANT
PACK HIP PROSTHESIS (MISCELLANEOUS) ×1 IMPLANT
PADDING CAST BLEND 4X4 NS (MISCELLANEOUS) ×2 IMPLANT
PILLOW ABDUCTION MEDIUM (MISCELLANEOUS) ×1 IMPLANT
PULSAVAC PLUS IRRIG FAN TIP (DISPOSABLE) ×1
SOLUTION IRRIG SURGIPHOR (IV SOLUTION) ×1 IMPLANT
SPONGE T-LAP 18X18 ~~LOC~~+RFID (SPONGE) ×2 IMPLANT
STAPLER SKIN PROX 35W (STAPLE) ×1 IMPLANT
SUT BONE WAX W31G (SUTURE) ×1 IMPLANT
SUT DVC 2 QUILL PDO  T11 36X36 (SUTURE) ×1
SUT DVC 2 QUILL PDO T11 36X36 (SUTURE) ×1 IMPLANT
SUT VIC AB 2-0 CT1 18 (SUTURE) ×1 IMPLANT
SYR 30ML LL (SYRINGE) ×1 IMPLANT
TIP FAN IRRIG PULSAVAC PLUS (DISPOSABLE) ×1 IMPLANT
TRAP FLUID SMOKE EVACUATOR (MISCELLANEOUS) ×1 IMPLANT
WAND WEREWOLF FASTSEAL 6.0 (MISCELLANEOUS) ×1 IMPLANT
WATER STERILE IRR 500ML POUR (IV SOLUTION) ×1 IMPLANT

## 2022-03-10 NOTE — H&P (Signed)
Patient drug screen positive. Surgery is cancelled.

## 2022-03-30 ENCOUNTER — Ambulatory Visit: Payer: Medicare Other | Admitting: Urgent Care

## 2022-03-30 ENCOUNTER — Encounter
Admission: RE | Admit: 2022-03-30 | Discharge: 2022-03-30 | Disposition: A | Discharge status: Home / Self Care | Payer: Medicare Other | Source: Ambulatory Visit | Attending: Orthopedic Surgery | Admitting: Orthopedic Surgery

## 2022-03-30 ENCOUNTER — Other Ambulatory Visit: Payer: Self-pay | Admitting: Orthopedic Surgery

## 2022-03-30 DIAGNOSIS — F1910 Other psychoactive substance abuse, uncomplicated: Secondary | ICD-10-CM | POA: Diagnosis not present

## 2022-03-30 DIAGNOSIS — Z01812 Encounter for preprocedural laboratory examination: Secondary | ICD-10-CM | POA: Insufficient documentation

## 2022-03-30 DIAGNOSIS — R825 Elevated urine levels of drugs, medicaments and biological substances: Secondary | ICD-10-CM | POA: Insufficient documentation

## 2022-03-30 DIAGNOSIS — Z01818 Encounter for other preprocedural examination: Secondary | ICD-10-CM | POA: Diagnosis present

## 2022-03-30 DIAGNOSIS — F1490 Cocaine use, unspecified, uncomplicated: Secondary | ICD-10-CM | POA: Insufficient documentation

## 2022-03-30 LAB — URINALYSIS, ROUTINE W REFLEX MICROSCOPIC
Bilirubin Urine: NEGATIVE
Glucose, UA: NEGATIVE mg/dL
Ketones, ur: NEGATIVE mg/dL
Nitrite: NEGATIVE
Protein, ur: NEGATIVE mg/dL
Specific Gravity, Urine: 1.005 (ref 1.005–1.030)
WBC, UA: >50 WBC/hpf — ABNORMAL HIGH (ref 0–5)
pH: 6 (ref 5.0–8.0)

## 2022-03-30 LAB — URINE DRUG SCREEN, QUALITATIVE (ARMC ONLY)
Amphetamines, Ur Screen: NOT DETECTED
Barbiturates, Ur Screen: NOT DETECTED
Benzodiazepine, Ur Scrn: NOT DETECTED
Cannabinoid 50 Ng, Ur ~~LOC~~: POSITIVE — AB
Cocaine Metabolite,Ur ~~LOC~~: NOT DETECTED
MDMA (Ecstasy)Ur Screen: NOT DETECTED
Methadone Scn, Ur: NOT DETECTED
Opiate, Ur Screen: NOT DETECTED
Phencyclidine (PCP) Ur S: NOT DETECTED
Tricyclic, Ur Screen: NOT DETECTED

## 2022-03-30 LAB — TYPE AND SCREEN
ABO/RH(D): B POS
Antibody Screen: NEGATIVE

## 2022-03-30 LAB — CBC
HCT: 40.5 % (ref 39.0–52.0)
Hemoglobin: 13.7 g/dL (ref 13.0–17.0)
MCH: 31.6 pg (ref 26.0–34.0)
MCHC: 33.8 g/dL (ref 30.0–36.0)
MCV: 93.5 fL (ref 80.0–100.0)
Platelets: 224 10*3/uL (ref 150–400)
RBC: 4.33 MIL/uL (ref 4.22–5.81)
RDW: 15 % (ref 11.5–15.5)
WBC: 7.1 10*3/uL (ref 4.0–10.5)
nRBC: 0 % (ref 0.0–0.2)

## 2022-03-30 LAB — BASIC METABOLIC PANEL
Anion gap: 10 (ref 5–15)
BUN: 15 mg/dL (ref 8–23)
CO2: 22 mmol/L (ref 22–32)
Calcium: 9.7 mg/dL (ref 8.9–10.3)
Chloride: 109 mmol/L (ref 98–111)
Creatinine, Ser: 0.91 mg/dL (ref 0.61–1.24)
GFR, Estimated: >60 mL/min (ref >60)
Glucose, Bld: 103 mg/dL — ABNORMAL HIGH (ref 70–99)
Potassium: 3.9 mmol/L (ref 3.5–5.1)
Sodium: 141 mmol/L (ref 135–145)

## 2022-03-30 NOTE — Pre-Procedure Instructions (Signed)
Patient arrived for labs, medications , HX and allergies updated with no changes , patient voices that he still has instructions, soap and has no questions at this time.

## 2022-03-31 ENCOUNTER — Encounter: Payer: Self-pay | Admitting: Urgent Care

## 2022-04-01 LAB — URINE CULTURE: Culture: >=100000 — AB

## 2022-04-01 NOTE — Progress Notes (Signed)
  Perioperative Services Pre-Admission/Anesthesia Testing    Date: 04/01/22  Name: Jack Blanchard MRN:   [id number]  Re: Urine culture  Planned Surgical Procedure(s):    Case: [id number] Date/Time: 04/07/22 1215   Procedure: TOTAL HIP ARTHROPLASTY ANTERIOR APPROACH (Right: Hip)   Anesthesia type: Spinal   Pre-op diagnosis: M16.11 Unilateral primary osteoarthritis, right hip   Location: [facility] OR ROOM 05 / [facility] FOR ANESTHESIA GROUP   Surgeons: Lovell Sheehan, MD   Clinical Notes:  Patient is scheduled for the above procedure on 04/07/2022 with Dr. Kurtis Bushman, MD.  Patient initially came through PAT department on 03/03/2022 for preoperative labs and testing, at which time he had a UA that was (+) for 60,000 CFU/mL GBS.  Patient was contacted to make him aware.  He was advised that an 5-day course of Augmentin was being called in to his pharmacy in efforts to sterilize his urine prior to surgery.  Later that day, patient's UDS resulted (+) cocaine and THC.  Surgeon was made aware.  Patient subsequently retested on the day of his procedure (03/10/2022), at which time UDS remained (+) for both cocaine and THC.  Procedure was subsequently rescheduled.  Patient came back through PAT department on 03/30/2022 for repeat labs and preprocedure interview.  In review of his labs, UDS now (+) for THC only.  UA still demonstrated findings consistent with infection.  Culture sent and resulted with increased GBS colony count (>100,000 CFU/mL).  Based on these findings, I do not believe that patient picked up previous prescription for antimicrobial coverage.   Attempted to contact patient to make him aware of urine findings and need for preoperative treatment several times. Unable to speak with patient or [facility] as voicemail box was full.  Note sent to primary attending surgeon Harlow Mares, MD) and orthopedic APP Ronnald Ramp, PA-C) for follow-up.  Honor Loh, MSN, APRN, FNP-C, CEN [facility]   Peri-operative Services Nurse Practitioner Phone: [phone] 04/01/22 11:33 AM  NOTE: This note has been prepared using Dragon dictation software. Despite my best ability to proofread, there is always the potential that unintentional transcriptional errors may still occur from this process.

## 2022-04-06 MED ORDER — CEFAZOLIN SODIUM-DEXTROSE 2-4 GM/100ML-% IV SOLN: 2.0000 g | INTRAVENOUS | Status: DC

## 2022-04-06 MED ORDER — CHLORHEXIDINE GLUCONATE 0.12 % MT SOLN: 15.0000 mL | Freq: Once | OROMUCOSAL | Status: AC

## 2022-04-06 MED ORDER — CHLORHEXIDINE GLUCONATE 4 % EX LIQD: 60.0000 mL | Freq: Once | CUTANEOUS | Status: DC

## 2022-04-06 MED ORDER — LACTATED RINGERS IV SOLN: INTRAVENOUS | Status: DC

## 2022-04-06 MED ORDER — POVIDONE-IODINE 10 % EX SWAB: 2.0000 | Freq: Once | CUTANEOUS | Status: DC

## 2022-04-06 MED ORDER — ORAL CARE MOUTH RINSE: 15.0000 mL | Freq: Once | OROMUCOSAL | Status: AC

## 2022-04-06 MED ORDER — TRANEXAMIC ACID-NACL 1000-0.7 MG/100ML-% IV SOLN: 1000.0000 mg | INTRAVENOUS | Status: DC

## 2022-04-06 MED ORDER — FAMOTIDINE 20 MG PO TABS: 20.0000 mg | ORAL_TABLET | Freq: Once | ORAL | Status: AC

## 2022-04-07 ENCOUNTER — Ambulatory Visit
Admission: RE | Admit: 2022-04-07 | Discharge: 2022-04-07 | Disposition: A | Discharge status: Home / Self Care | Payer: Medicare Other | Source: Ambulatory Visit | Attending: Orthopedic Surgery | Admitting: Orthopedic Surgery

## 2022-04-07 ENCOUNTER — Other Ambulatory Visit: Payer: Self-pay

## 2022-04-07 ENCOUNTER — Encounter
Admission: RE | Disposition: A | Discharge status: Home / Self Care | Payer: Self-pay | Source: Ambulatory Visit | Attending: Orthopedic Surgery

## 2022-04-07 DIAGNOSIS — Z5309 Procedure and treatment not carried out because of other contraindication: Secondary | ICD-10-CM | POA: Insufficient documentation

## 2022-04-07 DIAGNOSIS — Z01812 Encounter for preprocedural laboratory examination: Secondary | ICD-10-CM

## 2022-04-07 DIAGNOSIS — M1611 Unilateral primary osteoarthritis, right hip: Secondary | ICD-10-CM | POA: Diagnosis not present

## 2022-04-07 DIAGNOSIS — N390 Urinary tract infection, site not specified: Secondary | ICD-10-CM | POA: Insufficient documentation

## 2022-04-07 DIAGNOSIS — F1411 Cocaine abuse, in remission: Secondary | ICD-10-CM | POA: Diagnosis not present

## 2022-04-07 DIAGNOSIS — R825 Elevated urine levels of drugs, medicaments and biological substances: Secondary | ICD-10-CM

## 2022-04-07 DIAGNOSIS — R8290 Unspecified abnormal findings in urine: Secondary | ICD-10-CM

## 2022-04-07 DIAGNOSIS — F1910 Other psychoactive substance abuse, uncomplicated: Secondary | ICD-10-CM

## 2022-04-07 LAB — URINE DRUG SCREEN, QUALITATIVE (ARMC ONLY)
Amphetamines, Ur Screen: NOT DETECTED
Barbiturates, Ur Screen: NOT DETECTED
Benzodiazepine, Ur Scrn: NOT DETECTED
Cannabinoid 50 Ng, Ur ~~LOC~~: POSITIVE — AB
Cocaine Metabolite,Ur ~~LOC~~: NOT DETECTED
MDMA (Ecstasy)Ur Screen: NOT DETECTED
Methadone Scn, Ur: NOT DETECTED
Opiate, Ur Screen: NOT DETECTED
Phencyclidine (PCP) Ur S: NOT DETECTED
Tricyclic, Ur Screen: NOT DETECTED

## 2022-04-07 LAB — URINALYSIS, ROUTINE W REFLEX MICROSCOPIC
Bilirubin Urine: NEGATIVE
Glucose, UA: NEGATIVE mg/dL
Hgb urine dipstick: NEGATIVE
Ketones, ur: NEGATIVE mg/dL
Nitrite: NEGATIVE
Protein, ur: NEGATIVE mg/dL
Specific Gravity, Urine: 1.003 — ABNORMAL LOW (ref 1.005–1.030)
pH: 6 (ref 5.0–8.0)

## 2022-04-07 SURGERY — ARTHROPLASTY, HIP, TOTAL, ANTERIOR APPROACH
Anesthesia: Spinal | Site: Hip | Laterality: Right

## 2022-04-07 MED ORDER — POVIDONE-IODINE 10 % EX SWAB
2.0000 | Freq: Once | CUTANEOUS | Status: AC
  Administered 2022-04-07: 2 via TOPICAL

## 2022-04-07 MED ORDER — FAMOTIDINE 20 MG PO TABS
ORAL_TABLET | ORAL | Status: AC
  Administered 2022-04-07: 20 mg via ORAL
  Filled 2022-04-07: qty 1

## 2022-04-07 MED ORDER — CEFAZOLIN SODIUM-DEXTROSE 2-4 GM/100ML-% IV SOLN
INTRAVENOUS | Status: AC
  Filled 2022-04-07: qty 100

## 2022-04-07 MED ORDER — AMOXICILLIN-POT CLAVULANATE 500-125 MG PO TABS: 1.0000 | ORAL_TABLET | Freq: Two times a day (BID) | ORAL | 0 refills | Status: AC

## 2022-04-07 MED ORDER — CHLORHEXIDINE GLUCONATE 0.12 % MT SOLN
OROMUCOSAL | Status: AC
  Administered 2022-04-07: 15 mL via OROMUCOSAL
  Filled 2022-04-07: qty 15

## 2022-04-07 MED ORDER — TRANEXAMIC ACID-NACL 1000-0.7 MG/100ML-% IV SOLN
INTRAVENOUS | Status: AC
  Filled 2022-04-07: qty 100

## 2022-04-07 SURGICAL SUPPLY — 47 items
BLADE SAGITTAL WIDE XTHICK NO (BLADE) ×1 IMPLANT
BNDG COHESIVE 4X5 TAN STRL LF (GAUZE/BANDAGES/DRESSINGS) IMPLANT
BRUSH SCRUB EZ  4% CHG (MISCELLANEOUS) ×1
BRUSH SCRUB EZ 4% CHG (MISCELLANEOUS) ×1 IMPLANT
CHLORAPREP W/TINT 26 (MISCELLANEOUS) ×2 IMPLANT
DRAPE 3/4 80X56 (DRAPES) ×1 IMPLANT
DRAPE C-ARM 42X72 X-RAY (DRAPES) ×1 IMPLANT
DRAPE STERI IOBAN 125X83 (DRAPES) IMPLANT
DRAPE U-SHAPE 47X51 STRL (DRAPES) ×1 IMPLANT
DRSG AQUACEL AG ADV 3.5X10 (GAUZE/BANDAGES/DRESSINGS) IMPLANT
DRSG AQUACEL AG ADV 3.5X14 (GAUZE/BANDAGES/DRESSINGS) IMPLANT
ELECT REM PT RETURN 9FT ADLT (ELECTROSURGICAL) ×1
ELECTRODE REM PT RTRN 9FT ADLT (ELECTROSURGICAL) ×1 IMPLANT
GAUZE 4X4 16PLY ~~LOC~~+RFID DBL (SPONGE) ×1 IMPLANT
GAUZE XEROFORM 1X8 LF (GAUZE/BANDAGES/DRESSINGS) IMPLANT
GLOVE BIO SURGEON STRL SZ8 (GLOVE) ×1 IMPLANT
GLOVE BIOGEL PI IND STRL 8.5 (GLOVE) ×2 IMPLANT
GLOVE PI ORTHO PRO STRL SZ8 (GLOVE) ×2 IMPLANT
GOWN STRL REUS W/ TWL XL LVL3 (GOWN DISPOSABLE) ×2 IMPLANT
GOWN STRL REUS W/TWL XL LVL3 (GOWN DISPOSABLE) ×2
HOOD PEEL AWAY T7 (MISCELLANEOUS) ×3 IMPLANT
IV NS 250ML (IV SOLUTION)
IV NS 250ML BAXH (IV SOLUTION) IMPLANT
IV NS IRRIG 3000ML ARTHROMATIC (IV SOLUTION) ×1 IMPLANT
KIT PATIENT CARE HANA TABLE (KITS) ×1 IMPLANT
KIT TURNOVER CYSTO (KITS) ×1 IMPLANT
MANIFOLD NEPTUNE II (INSTRUMENTS) ×1 IMPLANT
MAT ABSORB  FLUID 56X50 GRAY (MISCELLANEOUS) ×1
MAT ABSORB FLUID 56X50 GRAY (MISCELLANEOUS) ×1 IMPLANT
NDL SAFETY ECLIP 18X1.5 (MISCELLANEOUS) IMPLANT
NEEDLE SPNL 20GX3.5 QUINCKE YW (NEEDLE) ×1 IMPLANT
PACK HIP PROSTHESIS (MISCELLANEOUS) ×1 IMPLANT
PADDING CAST BLEND 4X4 NS (MISCELLANEOUS) ×2 IMPLANT
PILLOW ABDUCTION MEDIUM (MISCELLANEOUS) ×1 IMPLANT
PULSAVAC PLUS IRRIG FAN TIP (DISPOSABLE) ×1
SOLUTION IRRIG SURGIPHOR (IV SOLUTION) ×1 IMPLANT
SPONGE T-LAP 18X18 ~~LOC~~+RFID (SPONGE) ×2 IMPLANT
STAPLER SKIN PROX 35W (STAPLE) ×1 IMPLANT
SUT BONE WAX W31G (SUTURE) ×1 IMPLANT
SUT DVC 2 QUILL PDO  T11 36X36 (SUTURE) ×1
SUT DVC 2 QUILL PDO T11 36X36 (SUTURE) ×1 IMPLANT
SUT VIC AB 2-0 CT1 18 (SUTURE) ×1 IMPLANT
SYR 30ML LL (SYRINGE) ×1 IMPLANT
TIP FAN IRRIG PULSAVAC PLUS (DISPOSABLE) ×1 IMPLANT
TRAP FLUID SMOKE EVACUATOR (MISCELLANEOUS) ×1 IMPLANT
WAND WEREWOLF FASTSEAL 6.0 (MISCELLANEOUS) ×1 IMPLANT
WATER STERILE IRR 500ML POUR (IV SOLUTION) ×1 IMPLANT

## 2022-04-07 NOTE — H&P (Signed)
Patient with persistent UTI. Cancel surgery today, treat his UTI, and re-check his UA. Will reschedule surgery when this has resolved.

## 2022-04-07 NOTE — Progress Notes (Signed)
  Perioperative Services Pre-Admission/Anesthesia Testing    Date: 04/07/22  Name: Jack Blanchard MRN:   [id number]  Re: Preoperative infection and need for treatment  Planned Surgical Procedure(s):    Case: [id number] Date/Time: 04/07/22 1519   Procedure: TOTAL HIP ARTHROPLASTY ANTERIOR APPROACH (Right: Hip)   Anesthesia type: Spinal   Pre-op diagnosis: M16.11 Unilateral primary osteoarthritis, right hip   Location: [facility] OR ROOM 05 / [place] [facility] FOR ANESTHESIA GROUP   Surgeons: Lovell Sheehan, MD   Clinical Notes:  Patient is scheduled for the above procedure today with Dr. Kurtis Bushman, MD. Patient was originally scheduled for procedure back in October, however he was canceled for a (+) UDS requiring surgery to be rescheduled.  Patient has come for PAT appointment twice at this point.  Each time on his preoperative lab work, patient with urine (+) for pathogenically significant growth.  Pathogen identified as GBS.  Prescription was called in for patient on 03/04/2022 for Augmentin 500/125 mg PO twice daily x 5 days.  We learned that patient never picked up prescription when he came back for PAT for second preoperative appointment.  Urine was recultured and CFU and increased from 60 K to 100 K.  Attempted to contact patient to make him aware of worsening urine culture and need for preoperative treatment, however unable to reach patient.  Surgeon was made aware.  Patient presented to SDS today for planned surgical intervention.  UA was repeated showing moderate LE, WBCs (11-20), and rare bacteria.  The decision was made by surgeon and anesthesia to postpone case pending appropriate treatment of identified urinary tract infection.  Patient advising that he had been to his pharmacy twice and no prescription has been called in for him. E-script indicates that prescription was sent in 03/04/2022 at 1800 PM.  Preoperative nurse called patient's pharmacy and learned that patient indeed picked up the  prescription back in October, however patient does not recall, and is adamant that had not taken any antibiotics.  In efforts to ensure no further confusion with his issue, printed prescription for Augmentin 500/125 mg PO twice daily x 7 days PRINTED and handed directly to patient to take to his pharmacy.  Given the length of time that has been since initial pathogen identified, course of treatment extended out to a full 7 days.  Patient was encouraged to increase water intake while on this medication.  He will follow-up with his primary attending surgeon regarding rescheduling of his case once antimicrobial course completed.  Meds ordered this encounter  Medications   amoxicillin-clavulanate (AUGMENTIN) 500-125 MG tablet    Sig: Take 1 tablet by mouth 2 (two) times daily for 7 days.    Dispense:  14 tablet    Refill:  0   Honor Loh, MSN, APRN, FNP-C, CEN [facility]  Peri-operative Services Nurse Practitioner Phone: [phone] 04/07/22 3:07 PM  NOTE: This note has been prepared using Dragon dictation software. Despite my best ability to proofread, there is always the potential that unintentional transcriptional errors may still occur from this process.

## 2022-04-07 NOTE — Progress Notes (Addendum)
Scheduled surgery cancelled per  Anesthesia team due to bacteria in his urine.

## 2022-04-14 ENCOUNTER — Other Ambulatory Visit
Admission: RE | Admit: 2022-04-14 | Discharge: 2022-04-14 | Disposition: A | Discharge status: Home / Self Care | Payer: Medicare Other | Attending: Orthopedic Surgery | Admitting: Orthopedic Surgery

## 2022-04-14 DIAGNOSIS — N390 Urinary tract infection, site not specified: Secondary | ICD-10-CM | POA: Diagnosis present

## 2022-04-14 LAB — URINALYSIS, COMPLETE (UACMP) WITH MICROSCOPIC
Bacteria, UA: NONE SEEN
Bilirubin Urine: NEGATIVE
Glucose, UA: NEGATIVE mg/dL
Hgb urine dipstick: NEGATIVE
Ketones, ur: NEGATIVE mg/dL
Leukocytes,Ua: NEGATIVE
Nitrite: NEGATIVE
Protein, ur: NEGATIVE mg/dL
Specific Gravity, Urine: 1.011 (ref 1.005–1.030)
pH: 6 (ref 5.0–8.0)

## 2022-04-20 ENCOUNTER — Encounter: Payer: Self-pay | Admitting: Urgent Care

## 2022-04-20 ENCOUNTER — Encounter
Admission: RE | Admit: 2022-04-20 | Discharge: 2022-04-20 | Disposition: A | Discharge status: Home / Self Care | Payer: Medicare Other | Source: Ambulatory Visit | Attending: Orthopedic Surgery | Admitting: Orthopedic Surgery

## 2022-04-20 DIAGNOSIS — Z96653 Presence of artificial knee joint, bilateral: Secondary | ICD-10-CM

## 2022-04-20 DIAGNOSIS — Z01812 Encounter for preprocedural laboratory examination: Secondary | ICD-10-CM

## 2022-04-20 DIAGNOSIS — M1611 Unilateral primary osteoarthritis, right hip: Secondary | ICD-10-CM

## 2022-04-20 DIAGNOSIS — R825 Elevated urine levels of drugs, medicaments and biological substances: Secondary | ICD-10-CM

## 2022-04-20 DIAGNOSIS — F1290 Cannabis use, unspecified, uncomplicated: Secondary | ICD-10-CM

## 2022-04-20 DIAGNOSIS — M170 Bilateral primary osteoarthritis of knee: Secondary | ICD-10-CM

## 2022-04-20 LAB — URINE DRUG SCREEN, QUALITATIVE (ARMC ONLY)
Amphetamines, Ur Screen: NOT DETECTED
Barbiturates, Ur Screen: NOT DETECTED
Benzodiazepine, Ur Scrn: NOT DETECTED
Cannabinoid 50 Ng, Ur ~~LOC~~: POSITIVE — AB
Cocaine Metabolite,Ur ~~LOC~~: NOT DETECTED
MDMA (Ecstasy)Ur Screen: NOT DETECTED
Methadone Scn, Ur: NOT DETECTED
Opiate, Ur Screen: NOT DETECTED
Phencyclidine (PCP) Ur S: NOT DETECTED
Tricyclic, Ur Screen: NOT DETECTED

## 2022-04-20 LAB — TYPE AND SCREEN
ABO/RH(D): B POS
Antibody Screen: NEGATIVE

## 2022-04-20 LAB — URINALYSIS, COMPLETE (UACMP) WITH MICROSCOPIC
Bacteria, UA: NONE SEEN
Bilirubin Urine: NEGATIVE
Glucose, UA: NEGATIVE mg/dL
Hgb urine dipstick: NEGATIVE
Ketones, ur: NEGATIVE mg/dL
Nitrite: NEGATIVE
Protein, ur: NEGATIVE mg/dL
Specific Gravity, Urine: 1.008 (ref 1.005–1.030)
pH: 6 (ref 5.0–8.0)

## 2022-04-20 MED ORDER — FAMOTIDINE 20 MG PO TABS: 20.0000 mg | ORAL_TABLET | Freq: Once | ORAL | Status: AC

## 2022-04-20 MED ORDER — LACTATED RINGERS IV SOLN: INTRAVENOUS | Status: DC

## 2022-04-20 MED ORDER — ORAL CARE MOUTH RINSE: 15.0000 mL | Freq: Once | OROMUCOSAL | Status: AC

## 2022-04-20 MED ORDER — CHLORHEXIDINE GLUCONATE 0.12 % MT SOLN: 15.0000 mL | Freq: Once | OROMUCOSAL | Status: AC

## 2022-04-21 ENCOUNTER — Other Ambulatory Visit: Payer: Self-pay

## 2022-04-21 ENCOUNTER — Ambulatory Visit: Payer: Medicare Other | Admitting: Urgent Care

## 2022-04-21 ENCOUNTER — Inpatient Hospital Stay
Admission: AD | Admit: 2022-04-21 | Discharge: 2022-04-23 | DRG: 470 | Disposition: A | Discharge status: Home Health | Payer: Medicare Other | Attending: Orthopedic Surgery | Admitting: Orthopedic Surgery

## 2022-04-21 ENCOUNTER — Encounter
Admission: AD | Disposition: A | Discharge status: Home Health | Payer: Self-pay | Source: Home / Self Care | Attending: Orthopedic Surgery

## 2022-04-21 ENCOUNTER — Encounter: Payer: Self-pay | Admitting: Orthopedic Surgery

## 2022-04-21 ENCOUNTER — Ambulatory Visit: Payer: Medicare Other

## 2022-04-21 DIAGNOSIS — Z886 Allergy status to analgesic agent status: Secondary | ICD-10-CM

## 2022-04-21 DIAGNOSIS — Z881 Allergy status to other antibiotic agents status: Secondary | ICD-10-CM

## 2022-04-21 DIAGNOSIS — Z96651 Presence of right artificial knee joint: Secondary | ICD-10-CM | POA: Diagnosis present

## 2022-04-21 DIAGNOSIS — M797 Fibromyalgia: Secondary | ICD-10-CM | POA: Diagnosis present

## 2022-04-21 DIAGNOSIS — G4733 Obstructive sleep apnea (adult) (pediatric): Secondary | ICD-10-CM | POA: Diagnosis present

## 2022-04-21 DIAGNOSIS — E785 Hyperlipidemia, unspecified: Secondary | ICD-10-CM | POA: Diagnosis present

## 2022-04-21 DIAGNOSIS — M1611 Unilateral primary osteoarthritis, right hip: Principal | ICD-10-CM | POA: Diagnosis present

## 2022-04-21 DIAGNOSIS — Z01812 Encounter for preprocedural laboratory examination: Secondary | ICD-10-CM

## 2022-04-21 DIAGNOSIS — R825 Elevated urine levels of drugs, medicaments and biological substances: Secondary | ICD-10-CM

## 2022-04-21 DIAGNOSIS — I10 Essential (primary) hypertension: Secondary | ICD-10-CM | POA: Diagnosis present

## 2022-04-21 DIAGNOSIS — F1290 Cannabis use, unspecified, uncomplicated: Secondary | ICD-10-CM

## 2022-04-21 DIAGNOSIS — F17290 Nicotine dependence, other tobacco product, uncomplicated: Secondary | ICD-10-CM | POA: Diagnosis present

## 2022-04-21 DIAGNOSIS — F1490 Cocaine use, unspecified, uncomplicated: Secondary | ICD-10-CM

## 2022-04-21 DIAGNOSIS — J45909 Unspecified asthma, uncomplicated: Secondary | ICD-10-CM | POA: Diagnosis present

## 2022-04-21 DIAGNOSIS — Z96641 Presence of right artificial hip joint: Secondary | ICD-10-CM

## 2022-04-21 HISTORY — PX: TOTAL HIP ARTHROPLASTY: SHX124

## 2022-04-21 LAB — URINE DRUG SCREEN, QUALITATIVE (ARMC ONLY)
Amphetamines, Ur Screen: NOT DETECTED
Barbiturates, Ur Screen: NOT DETECTED
Benzodiazepine, Ur Scrn: NOT DETECTED
Cannabinoid 50 Ng, Ur ~~LOC~~: POSITIVE — AB
Cocaine Metabolite,Ur ~~LOC~~: NOT DETECTED
MDMA (Ecstasy)Ur Screen: NOT DETECTED
Methadone Scn, Ur: NOT DETECTED
Opiate, Ur Screen: NOT DETECTED
Phencyclidine (PCP) Ur S: NOT DETECTED
Tricyclic, Ur Screen: NOT DETECTED

## 2022-04-21 SURGERY — ARTHROPLASTY, HIP, TOTAL, ANTERIOR APPROACH
Anesthesia: General | Site: Hip | Laterality: Right

## 2022-04-21 MED ORDER — DEXTROSE 5 % IV SOLN: INTRAVENOUS | Status: DC | PRN

## 2022-04-21 MED ORDER — LABETALOL HCL 5 MG/ML IV SOLN
INTRAVENOUS | Status: DC | PRN
  Administered 2022-04-21: 2.5 mg via INTRAVENOUS
  Administered 2022-04-21: 10 mg via INTRAVENOUS
  Administered 2022-04-21 (×2): 2.5 mg via INTRAVENOUS
  Administered 2022-04-21: 10 mg via INTRAVENOUS
  Administered 2022-04-21: 2.5 mg via INTRAVENOUS

## 2022-04-21 MED ORDER — OXYCODONE HCL 5 MG PO TABS
10.0000 mg | ORAL_TABLET | ORAL | Status: DC | PRN
  Administered 2022-04-21 – 2022-04-22 (×2): 10 mg via ORAL
  Administered 2022-04-22 – 2022-04-23 (×3): 15 mg via ORAL
  Filled 2022-04-21 (×4): qty 3

## 2022-04-21 MED ORDER — CEFAZOLIN SODIUM-DEXTROSE 2-4 GM/100ML-% IV SOLN
2.0000 g | Freq: Four times a day (QID) | INTRAVENOUS | Status: AC
  Administered 2022-04-21 – 2022-04-22 (×2): 2 g via INTRAVENOUS
  Filled 2022-04-21 (×2): qty 100

## 2022-04-21 MED ORDER — METOCLOPRAMIDE HCL 5 MG PO TABS: 5.0000 mg | ORAL_TABLET | Freq: Three times a day (TID) | ORAL | Status: DC | PRN

## 2022-04-21 MED ORDER — KETOROLAC TROMETHAMINE 15 MG/ML IJ SOLN
7.5000 mg | Freq: Four times a day (QID) | INTRAMUSCULAR | Status: AC
  Administered 2022-04-22 (×3): 7.5 mg via INTRAVENOUS
  Filled 2022-04-21 (×4): qty 1

## 2022-04-21 MED ORDER — PROPOFOL 10 MG/ML IV BOLUS
INTRAVENOUS | Status: AC
  Filled 2022-04-21: qty 20

## 2022-04-21 MED ORDER — FAMOTIDINE 20 MG PO TABS
ORAL_TABLET | ORAL | Status: AC
  Administered 2022-04-21: 20 mg via ORAL
  Filled 2022-04-21: qty 1

## 2022-04-21 MED ORDER — LABETALOL HCL 5 MG/ML IV SOLN
INTRAVENOUS | Status: AC
  Filled 2022-04-21: qty 4

## 2022-04-21 MED ORDER — MIDAZOLAM HCL 2 MG/2ML IJ SOLN
INTRAMUSCULAR | Status: DC | PRN
  Administered 2022-04-21: 2 mg via INTRAVENOUS

## 2022-04-21 MED ORDER — HYDROMORPHONE HCL 1 MG/ML IJ SOLN
INTRAMUSCULAR | Status: AC
  Filled 2022-04-21: qty 1

## 2022-04-21 MED ORDER — MENTHOL 3 MG MT LOZG: 1.0000 | LOZENGE | OROMUCOSAL | Status: DC | PRN

## 2022-04-21 MED ORDER — FENTANYL CITRATE (PF) 100 MCG/2ML IJ SOLN: 25.0000 ug | INTRAMUSCULAR | Status: DC | PRN

## 2022-04-21 MED ORDER — METHOCARBAMOL 500 MG PO TABS
750.0000 mg | ORAL_TABLET | Freq: Every day | ORAL | Status: DC
  Administered 2022-04-22: 750 mg via ORAL
  Filled 2022-04-21: qty 2

## 2022-04-21 MED ORDER — BISACODYL 10 MG RE SUPP: 10.0000 mg | Freq: Every day | RECTAL | Status: DC | PRN

## 2022-04-21 MED ORDER — LACTATED RINGERS IV SOLN: INTRAVENOUS | Status: DC

## 2022-04-21 MED ORDER — MIDAZOLAM HCL 2 MG/2ML IJ SOLN
INTRAMUSCULAR | Status: AC
  Filled 2022-04-21: qty 2

## 2022-04-21 MED ORDER — BUPIVACAINE-MELOXICAM ER 200-6 MG/7ML IJ SOLN
INTRAMUSCULAR | Status: AC
  Filled 2022-04-21: qty 1

## 2022-04-21 MED ORDER — ROCURONIUM BROMIDE 100 MG/10ML IV SOLN
INTRAVENOUS | Status: DC | PRN
  Administered 2022-04-21 (×2): 20 mg via INTRAVENOUS
  Administered 2022-04-21: 60 mg via INTRAVENOUS
  Administered 2022-04-21: 10 mg via INTRAVENOUS

## 2022-04-21 MED ORDER — ALBUTEROL SULFATE (2.5 MG/3ML) 0.083% IN NEBU: 2.5000 mg | INHALATION_SOLUTION | RESPIRATORY_TRACT | Status: DC | PRN

## 2022-04-21 MED ORDER — FENTANYL CITRATE (PF) 100 MCG/2ML IJ SOLN
INTRAMUSCULAR | Status: DC | PRN
  Administered 2022-04-21: 100 ug via INTRAVENOUS

## 2022-04-21 MED ORDER — LIDOCAINE HCL (CARDIAC) PF 100 MG/5ML IV SOSY
PREFILLED_SYRINGE | INTRAVENOUS | Status: DC | PRN
  Administered 2022-04-21 (×3): 50 mg via INTRAVENOUS
  Administered 2022-04-21: 100 mg via INTRAVENOUS
  Administered 2022-04-21: 50 mg via INTRAVENOUS

## 2022-04-21 MED ORDER — ASPIRIN 81 MG PO CHEW
81.0000 mg | CHEWABLE_TABLET | Freq: Two times a day (BID) | ORAL | Status: DC
  Administered 2022-04-22 – 2022-04-23 (×3): 81 mg via ORAL
  Filled 2022-04-21 (×4): qty 1

## 2022-04-21 MED ORDER — TAMSULOSIN HCL 0.4 MG PO CAPS
0.4000 mg | ORAL_CAPSULE | Freq: Every evening | ORAL | Status: DC
  Administered 2022-04-22: 0.4 mg via ORAL
  Filled 2022-04-21: qty 1

## 2022-04-21 MED ORDER — HYDROMORPHONE HCL 1 MG/ML IJ SOLN
0.5000 mg | INTRAMUSCULAR | Status: DC | PRN
  Administered 2022-04-22 (×2): 1 mg via INTRAVENOUS
  Filled 2022-04-21 (×3): qty 1

## 2022-04-21 MED ORDER — HYDROMORPHONE HCL 1 MG/ML IJ SOLN
INTRAMUSCULAR | Status: DC | PRN
  Administered 2022-04-21: .5 mg via INTRAVENOUS
  Administered 2022-04-21: .25 mg via INTRAVENOUS
  Administered 2022-04-21 (×2): .5 mg via INTRAVENOUS
  Administered 2022-04-21: .25 mg via INTRAVENOUS
  Administered 2022-04-21: 1 mg via INTRAVENOUS

## 2022-04-21 MED ORDER — PHENOL 1.4 % MT LIQD: 1.0000 | OROMUCOSAL | Status: DC | PRN

## 2022-04-21 MED ORDER — ALBUTEROL SULFATE HFA 108 (90 BASE) MCG/ACT IN AERS: 2.0000 | INHALATION_SPRAY | RESPIRATORY_TRACT | Status: DC | PRN

## 2022-04-21 MED ORDER — SUGAMMADEX SODIUM 200 MG/2ML IV SOLN
INTRAVENOUS | Status: DC | PRN
  Administered 2022-04-21: 150 mg via INTRAVENOUS

## 2022-04-21 MED ORDER — CEFAZOLIN IN SODIUM CHLORIDE 3-0.9 GM/100ML-% IV SOLN
3.0000 g | INTRAVENOUS | Status: AC
  Administered 2022-04-21: 3 g via INTRAVENOUS
  Filled 2022-04-21: qty 100

## 2022-04-21 MED ORDER — METOCLOPRAMIDE HCL 5 MG/ML IJ SOLN: 5.0000 mg | Freq: Three times a day (TID) | INTRAMUSCULAR | Status: DC | PRN

## 2022-04-21 MED ORDER — MIRABEGRON ER 50 MG PO TB24
50.0000 mg | ORAL_TABLET | Freq: Every day | ORAL | Status: DC
  Administered 2022-04-22 – 2022-04-23 (×2): 50 mg via ORAL
  Filled 2022-04-21 (×2): qty 1

## 2022-04-21 MED ORDER — SODIUM CHLORIDE 0.9 % IR SOLN
Status: DC | PRN
  Administered 2022-04-21: 250 mL
  Administered 2022-04-21: 3000 mL

## 2022-04-21 MED ORDER — SUCCINYLCHOLINE CHLORIDE 200 MG/10ML IV SOSY
PREFILLED_SYRINGE | INTRAVENOUS | Status: DC | PRN
  Administered 2022-04-21: 100 mg via INTRAVENOUS

## 2022-04-21 MED ORDER — DOCUSATE SODIUM 100 MG PO CAPS
100.0000 mg | ORAL_CAPSULE | Freq: Two times a day (BID) | ORAL | Status: DC
  Administered 2022-04-22 – 2022-04-23 (×3): 100 mg via ORAL
  Filled 2022-04-21 (×4): qty 1

## 2022-04-21 MED ORDER — DULOXETINE HCL 30 MG PO CPEP
60.0000 mg | ORAL_CAPSULE | Freq: Every day | ORAL | Status: DC
  Administered 2022-04-22 – 2022-04-23 (×2): 60 mg via ORAL
  Filled 2022-04-21 (×2): qty 2

## 2022-04-21 MED ORDER — DEXAMETHASONE SODIUM PHOSPHATE 10 MG/ML IJ SOLN
INTRAMUSCULAR | Status: DC | PRN
  Administered 2022-04-21: 5 mg via INTRAVENOUS

## 2022-04-21 MED ORDER — DEXMEDETOMIDINE HCL IN NACL 80 MCG/20ML IV SOLN: INTRAVENOUS | Status: DC | PRN

## 2022-04-21 MED ORDER — ATORVASTATIN CALCIUM 20 MG PO TABS
20.0000 mg | ORAL_TABLET | Freq: Every day | ORAL | Status: DC
  Administered 2022-04-22: 20 mg via ORAL
  Filled 2022-04-21 (×2): qty 1

## 2022-04-21 MED ORDER — FENTANYL CITRATE (PF) 100 MCG/2ML IJ SOLN
INTRAMUSCULAR | Status: AC
  Filled 2022-04-21: qty 2

## 2022-04-21 MED ORDER — MAGNESIUM HYDROXIDE 400 MG/5ML PO SUSP
30.0000 mL | Freq: Every day | ORAL | Status: DC | PRN
  Administered 2022-04-22: 30 mL via ORAL
  Filled 2022-04-21: qty 30

## 2022-04-21 MED ORDER — HYDROMORPHONE HCL 1 MG/ML IJ SOLN
1.0000 mg | INTRAMUSCULAR | Status: DC | PRN
  Administered 2022-04-21: 1 mg via INTRAVENOUS

## 2022-04-21 MED ORDER — OXYCODONE HCL 5 MG PO TABS
5.0000 mg | ORAL_TABLET | ORAL | Status: DC | PRN
  Filled 2022-04-21: qty 2

## 2022-04-21 MED ORDER — ONDANSETRON HCL 4 MG PO TABS: 4.0000 mg | ORAL_TABLET | Freq: Four times a day (QID) | ORAL | Status: DC | PRN

## 2022-04-21 MED ORDER — TRANEXAMIC ACID-NACL 1000-0.7 MG/100ML-% IV SOLN
1000.0000 mg | INTRAVENOUS | Status: AC
  Administered 2022-04-21: 1000 mg via INTRAVENOUS

## 2022-04-21 MED ORDER — 0.9 % SODIUM CHLORIDE (POUR BTL) OPTIME
TOPICAL | Status: DC | PRN
  Administered 2022-04-21: 500 mL

## 2022-04-21 MED ORDER — BUPIVACAINE-EPINEPHRINE (PF) 0.25% -1:200000 IJ SOLN
INTRAMUSCULAR | Status: DC | PRN
  Administered 2022-04-21: 30 mL

## 2022-04-21 MED ORDER — ONDANSETRON HCL 4 MG/2ML IJ SOLN: 4.0000 mg | Freq: Four times a day (QID) | INTRAMUSCULAR | Status: DC | PRN

## 2022-04-21 MED ORDER — ALBUMIN HUMAN 5 % IV SOLN: INTRAVENOUS | Status: DC | PRN

## 2022-04-21 MED ORDER — BUPIVACAINE-MELOXICAM ER 200-6 MG/7ML IJ SOLN
INTRAMUSCULAR | Status: DC | PRN
  Administered 2022-04-21: 7 mL

## 2022-04-21 MED ORDER — GABAPENTIN 300 MG PO CAPS
600.0000 mg | ORAL_CAPSULE | Freq: Three times a day (TID) | ORAL | Status: DC
  Administered 2022-04-22 – 2022-04-23 (×4): 600 mg via ORAL
  Filled 2022-04-21 (×5): qty 2

## 2022-04-21 MED ORDER — DEXMEDETOMIDINE HCL IN NACL 80 MCG/20ML IV SOLN
INTRAVENOUS | Status: DC | PRN
  Administered 2022-04-21 (×2): 12 ug via INTRAMUSCULAR

## 2022-04-21 MED ORDER — OXYCODONE HCL 5 MG/5ML PO SOLN: 5.0000 mg | Freq: Once | ORAL | Status: DC | PRN

## 2022-04-21 MED ORDER — PROPOFOL 500 MG/50ML IV EMUL
INTRAVENOUS | Status: DC | PRN
  Administered 2022-04-21: 50 ug/kg/min via INTRAVENOUS

## 2022-04-21 MED ORDER — MOMETASONE FURO-FORMOTEROL FUM 200-5 MCG/ACT IN AERO
2.0000 | INHALATION_SPRAY | Freq: Two times a day (BID) | RESPIRATORY_TRACT | Status: DC
  Administered 2022-04-23: 2 via RESPIRATORY_TRACT
  Filled 2022-04-21: qty 8.8

## 2022-04-21 MED ORDER — PROPOFOL 10 MG/ML IV BOLUS
INTRAVENOUS | Status: DC | PRN
  Administered 2022-04-21: 200 mg via INTRAVENOUS

## 2022-04-21 MED ORDER — IRRISEPT - 450ML BOTTLE WITH 0.05% CHG IN STERILE WATER, USP 99.95% OPTIME
TOPICAL | Status: DC | PRN
  Administered 2022-04-21: 450 mL

## 2022-04-21 MED ORDER — ALUM & MAG HYDROXIDE-SIMETH 200-200-20 MG/5ML PO SUSP: 30.0000 mL | ORAL | Status: DC | PRN

## 2022-04-21 MED ORDER — TRANEXAMIC ACID-NACL 1000-0.7 MG/100ML-% IV SOLN
INTRAVENOUS | Status: AC
  Filled 2022-04-21: qty 100

## 2022-04-21 MED ORDER — OXYCODONE HCL 5 MG PO TABS: 5.0000 mg | ORAL_TABLET | Freq: Once | ORAL | Status: DC | PRN

## 2022-04-21 MED ORDER — MAGNESIUM CITRATE PO SOLN: 1.0000 | Freq: Once | ORAL | Status: DC | PRN

## 2022-04-21 MED ORDER — CHLORHEXIDINE GLUCONATE 0.12 % MT SOLN
OROMUCOSAL | Status: AC
  Administered 2022-04-21: 15 mL via OROMUCOSAL
  Filled 2022-04-21: qty 15

## 2022-04-21 MED ORDER — VITAMIN B-12 1000 MCG PO TABS
1000.0000 ug | ORAL_TABLET | Freq: Every day | ORAL | Status: DC
  Administered 2022-04-22 – 2022-04-23 (×2): 1000 ug via ORAL
  Filled 2022-04-21 (×3): qty 1

## 2022-04-21 MED ORDER — ALBUMIN HUMAN 5 % IV SOLN
INTRAVENOUS | Status: AC
  Filled 2022-04-21: qty 250

## 2022-04-21 MED ORDER — BUPIVACAINE-EPINEPHRINE (PF) 0.25% -1:200000 IJ SOLN
INTRAMUSCULAR | Status: AC
  Filled 2022-04-21: qty 30

## 2022-04-21 SURGICAL SUPPLY — 58 items
BLADE SAGITTAL WIDE XTHICK NO (BLADE) ×1 IMPLANT
BNDG COHESIVE 4X5 TAN STRL LF (GAUZE/BANDAGES/DRESSINGS) IMPLANT
BRUSH SCRUB EZ  4% CHG (MISCELLANEOUS) ×1
BRUSH SCRUB EZ 4% CHG (MISCELLANEOUS) ×1 IMPLANT
CHLORAPREP W/TINT 26 (MISCELLANEOUS) ×2 IMPLANT
COVER HOLE (Hips) IMPLANT
CUP R3 52MM (Hips) IMPLANT
DRAPE 3/4 80X56 (DRAPES) ×1 IMPLANT
DRAPE C-ARM 42X72 X-RAY (DRAPES) ×1 IMPLANT
DRAPE STERI IOBAN 125X83 (DRAPES) IMPLANT
DRAPE U-SHAPE 47X51 STRL (DRAPES) ×1 IMPLANT
DRSG AQUACEL AG ADV 3.5X10 (GAUZE/BANDAGES/DRESSINGS) IMPLANT
DRSG AQUACEL AG ADV 3.5X14 (GAUZE/BANDAGES/DRESSINGS) IMPLANT
ELECT REM PT RETURN 9FT ADLT (ELECTROSURGICAL) ×1
ELECTRODE REM PT RTRN 9FT ADLT (ELECTROSURGICAL) ×1 IMPLANT
GAUZE 4X4 16PLY ~~LOC~~+RFID DBL (SPONGE) ×1 IMPLANT
GAUZE XEROFORM 1X8 LF (GAUZE/BANDAGES/DRESSINGS) IMPLANT
GLOVE BIO SURGEON STRL SZ8 (GLOVE) ×2 IMPLANT
GLOVE BIOGEL PI IND STRL 8.5 (GLOVE) ×2 IMPLANT
GLOVE PI ORTHO PRO STRL SZ8 (GLOVE) ×2 IMPLANT
GOWN STRL REUS W/ TWL XL LVL3 (GOWN DISPOSABLE) ×2 IMPLANT
GOWN STRL REUS W/TWL XL LVL3 (GOWN DISPOSABLE) ×2
HEAD FEMORAL TAPER 36MM P0 (Head) IMPLANT
HOOD PEEL AWAY T7 (MISCELLANEOUS) ×3 IMPLANT
IV NS 250ML (IV SOLUTION) ×1
IV NS 250ML BAXH (IV SOLUTION) IMPLANT
IV NS IRRIG 3000ML ARTHROMATIC (IV SOLUTION) ×1 IMPLANT
JET LAVAGE IRRISEPT WOUND (IRRIGATION / IRRIGATOR) ×1
KIT PATIENT CARE HANA TABLE (KITS) ×1 IMPLANT
KIT TURNOVER CYSTO (KITS) ×1 IMPLANT
LAVAGE JET IRRISEPT WOUND (IRRIGATION / IRRIGATOR) IMPLANT
LINER ACETAB 0 DEG (Liner) IMPLANT
MANIFOLD NEPTUNE II (INSTRUMENTS) ×1 IMPLANT
MAT ABSORB  FLUID 56X50 GRAY (MISCELLANEOUS) ×1
MAT ABSORB FLUID 56X50 GRAY (MISCELLANEOUS) ×1 IMPLANT
NDL SAFETY ECLIP 18X1.5 (MISCELLANEOUS) IMPLANT
NDL SPNL 20GX3.5 QUINCKE YW (NEEDLE) ×1 IMPLANT
NEEDLE SPNL 20GX3.5 QUINCKE YW (NEEDLE) ×1 IMPLANT
NS IRRIG 500ML POUR BTL (IV SOLUTION) IMPLANT
PACK HIP PROSTHESIS (MISCELLANEOUS) ×1 IMPLANT
PADDING CAST BLEND 4X4 NS (MISCELLANEOUS) ×2 IMPLANT
PILLOW ABDUCTION MEDIUM (MISCELLANEOUS) ×1 IMPLANT
PULSAVAC PLUS IRRIG FAN TIP (DISPOSABLE) ×1
SCREW 6.5X25MM (Screw) IMPLANT
SOLUTION IRRIG SURGIPHOR (IV SOLUTION) ×1 IMPLANT
SPONGE T-LAP 18X18 ~~LOC~~+RFID (SPONGE) ×2 IMPLANT
STAPLER SKIN PROX 35W (STAPLE) ×1 IMPLANT
STEM LATERAL W/COLLAR SZ LAT4 (Stem) IMPLANT
SUT BONE WAX W31G (SUTURE) ×1 IMPLANT
SUT DVC 2 QUILL PDO  T11 36X36 (SUTURE) ×1
SUT DVC 2 QUILL PDO T11 36X36 (SUTURE) ×1 IMPLANT
SUT VIC AB 2-0 CT1 18 (SUTURE) ×1 IMPLANT
SYR 30ML LL (SYRINGE) ×1 IMPLANT
TIP FAN IRRIG PULSAVAC PLUS (DISPOSABLE) ×1 IMPLANT
TRAP FLUID SMOKE EVACUATOR (MISCELLANEOUS) ×1 IMPLANT
WAND WEREWOLF FASTSEAL 6.0 (MISCELLANEOUS) ×1 IMPLANT
WATER STERILE IRR 1000ML POUR (IV SOLUTION) IMPLANT
WATER STERILE IRR 500ML POUR (IV SOLUTION) ×1 IMPLANT

## 2022-04-21 NOTE — Op Note (Signed)
04/21/2022  5:45 PM  PATIENT:  Jack Blanchard   MRN: [id number]  PRE-OPERATIVE DIAGNOSIS:  Osteoarthritis right hip   POST-OPERATIVE DIAGNOSIS: Same  Procedure: Right Total Hip Replacement  Surgeon: Elyn Aquas. Harlow Mares, MD   Assist: Carlynn Spry, PA-C  Anesthesia: Spinal   EBL: 400 mL   Specimens: None   Drains: None   Components used: A size 4 lateral Polarstem Smith and [employer]hew, R3 size 52 mm shell, and a 36 mm +0 mm head    Description of the procedure in detail: After informed consent was obtained and the appropriate extremity marked in the pre-operative holding area, the patient was taken to the operating room and placed in the supine position on the fracture table. All pressure points were well padded and bilateral lower extremities were place in traction spars. The hip was prepped and draped in standard sterile fashion. A spinal anesthetic had been delivered by the anesthesia team. The skin and subcutaneous tissues were injected with a mixture of Marcaine with epinephrine for post-operative pain. A longitudinal incision approximately 10 cm in length was carried out from the anterior superior iliac spine to the greater trochanter. The tensor fascia was divided and blunt dissection was taken down to the level of the joint capsule. The lateral circumflex vessels were cauterized. Deep retractors were placed and a portion of the anterior capsule was excised. Using fluoroscopy the neck cut was planned and carried out with a sagittal saw. The head was passed from the field with use of a corkscrew and hip skid. Deep retractors were placed along the acetabulum and the degenerative labrum and large osteophytes were removed with a Rongeur. The cup was sequentially reamed to a size 52 mm. The wound was irrigated and using fluoroscopy the size 52 mm cup was impacted in to anatomic position. A single screw was placed followed by a threaded hole cover. The final liner was impacted in to position.  Attention was then turned to the proximal femur. The leg was placed in extension and external rotation. The canal was opened and sequentially broached to a size 4 lateral. The trial components were placed and the hip relocated. The components were found to be in good position using fluoroscopy. The hip was dislocated and the trial components removed. The final components were impacted in to position and the hip relocated. The final components were again check with fluoroscopy and found to be in good position. Hemostasis was achieved with electrocautery. The deep capsule was injected with Marcaine and epinephrine. The wound was irrigated with bacitracin laced normal saline and the tensor fascia closed with #2 Quill suture. The subcutaneous tissues were closed with 2-0 vicryl and staples for the skin. A sterile dressing was applied and an abduction pillow. Patient tolerated the procedure well and there were no apparent complication. Patient was taken to the recovery room in good condition.   Kurtis Bushman, MD

## 2022-04-21 NOTE — H&P (Signed)
The patient has been re-examined, and the chart reviewed, and there have been no interval changes to the documented history and physical.  Plan a right total hip today.  Anesthesia is not consulted regarding a peripheral nerve block for post-operative pain.  The risks, benefits, and alternatives have been discussed at length, and the patient is willing to proceed.    
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
## 2022-04-21 NOTE — Anesthesia Procedure Notes (Addendum)
Procedure Name: Intubation Date/Time: 04/21/2022 3:10 PM  Performed by: Adalberto Ill, CRNAPre-anesthesia Checklist: Patient identified, Emergency Drugs available, Suction available and Patient being monitored Patient Re-evaluated:Patient Re-evaluated prior to induction Oxygen Delivery Method: Circle System Utilized Preoxygenation: Pre-oxygenation with 100% oxygen Induction Type: IV induction Ventilation: Mask ventilation without difficulty Laryngoscope Size: McGraph and 3 Grade View: Grade I Tube type: Oral Tube size: 7.5 mm Number of attempts: 2 Airway Equipment and Method: Stylet Placement Confirmation: ETT inserted through vocal cords under direct vision, positive ETCO2 and breath sounds checked- equal and bilateral Secured at: 25 cm Tube secured with: Tape Dental Injury: Dental damage and Injury to lip  Comments: MD Piscitello present throughout, SRNA intubated patient, First attempt made with MAC 4 blade, vocal cords not easily viewed, 2nd attempt made with McGraph with MAC 3 blade.  Preop assessment showed upper tooth lose, discussed with patient about possibility of dislodging said tooth with intubation, patient stated he understood this. Upon scissoring/opening patient mouth for direct laryngoscopy, lose upper tooth fell out of patient gum and was safely removed from patient's mouth. 68m abrasion upper  lip, lubricant applied, bleeding stopped.

## 2022-04-21 NOTE — Transfer of Care (Signed)
Immediate Anesthesia Transfer of Care Note  Patient: Jack Blanchard  Procedure(s) Performed: TOTAL HIP ARTHROPLASTY ANTERIOR APPROACH (Right: Hip)  Patient Location: PACU  Anesthesia Type:General  Level of Consciousness: drowsy  Airway & Oxygen Therapy: Patient Spontanous Breathing and Patient connected to face mask oxygen  Post-op Assessment: Report given to RN and Post -op Vital signs reviewed and stable  Post vital signs: Reviewed and stable  Last Vitals:  Vitals Value Taken Time  BP    Temp    Pulse    Resp    SpO2      Last Pain:  Vitals:   04/21/22 1403  TempSrc: Temporal  PainSc: 10-Worst pain ever         Complications: No notable events documented.

## 2022-04-22 ENCOUNTER — Encounter: Payer: Self-pay | Admitting: Orthopedic Surgery

## 2022-04-22 DIAGNOSIS — I10 Essential (primary) hypertension: Secondary | ICD-10-CM | POA: Diagnosis present

## 2022-04-22 DIAGNOSIS — E785 Hyperlipidemia, unspecified: Secondary | ICD-10-CM | POA: Diagnosis present

## 2022-04-22 DIAGNOSIS — J45909 Unspecified asthma, uncomplicated: Secondary | ICD-10-CM | POA: Diagnosis present

## 2022-04-22 DIAGNOSIS — M1611 Unilateral primary osteoarthritis, right hip: Secondary | ICD-10-CM | POA: Diagnosis present

## 2022-04-22 DIAGNOSIS — F17290 Nicotine dependence, other tobacco product, uncomplicated: Secondary | ICD-10-CM | POA: Diagnosis present

## 2022-04-22 DIAGNOSIS — Z886 Allergy status to analgesic agent status: Secondary | ICD-10-CM | POA: Diagnosis not present

## 2022-04-22 DIAGNOSIS — G4733 Obstructive sleep apnea (adult) (pediatric): Secondary | ICD-10-CM | POA: Diagnosis present

## 2022-04-22 DIAGNOSIS — Z96651 Presence of right artificial knee joint: Secondary | ICD-10-CM | POA: Diagnosis present

## 2022-04-22 DIAGNOSIS — M797 Fibromyalgia: Secondary | ICD-10-CM | POA: Diagnosis present

## 2022-04-22 DIAGNOSIS — Z881 Allergy status to other antibiotic agents status: Secondary | ICD-10-CM | POA: Diagnosis not present

## 2022-04-22 DIAGNOSIS — M25551 Pain in right hip: Secondary | ICD-10-CM | POA: Diagnosis present

## 2022-04-22 NOTE — Progress Notes (Signed)
Physical Therapy Treatment Patient Details Name: Jack Blanchard MRN: [id number] DOB: 12/15/1950 Today's Date: 04/22/2022   History of Present Illness 71 yo male s/p R THA. PMH includes COPD, HTN, Substance abuse, and OSA.    PT Comments    Pt found in chair upon PT entry with c/o 5/10 pain in R hip s/p THA. Sit<>Stand with mod assist x1 and RW. Pt took 3 steps forward and 3 steps back with RW and CGA. Pt remained motivated throughout session and able to tolerate seated exercises. Pt would benefit from skilled physical therapy to address the listed deficits (see below) to increase independence with ADLs and function. Current recommendation is SNF to return pt to PLOF.      Recommendations for follow up therapy are one component of a multi-disciplinary discharge planning process, led by the attending physician.  Recommendations may be updated based on patient status, additional functional criteria and insurance authorization.  Follow Up Recommendations  Skilled nursing-short term rehab (<3 hours/day) Can patient physically be transported by private vehicle: Yes   Assistance Recommended at Discharge Frequent or constant Supervision/Assistance  Patient can return home with the following A lot of help with walking and/or transfers;A lot of help with bathing/dressing/bathroom;Assistance with cooking/housework;Assist for transportation;Help with stairs or ramp for entrance   Equipment Recommendations  None recommended by PT    Recommendations for Other Services       Precautions / Restrictions Precautions Precautions: Fall Precaution Booklet Issued: No Precaution Comments: direct anterior Restrictions Weight Bearing Restrictions: Yes RLE Weight Bearing: Weight bearing as tolerated     Mobility  Bed Mobility Overal bed mobility: Needs Assistance Bed Mobility: Supine to Sit               Transfers Overall transfer level: Needs assistance Equipment used: Rolling walker (2  wheels) Transfers: Sit to/from Stand Sit to Stand: Mod assist               Ambulation/Gait Ambulation/Gait assistance: Min guard Gait Distance (Feet): 3 Feet Assistive device: Rolling walker (2 wheels) Gait Pattern/deviations: Step-to pattern, Antalgic       General Gait Details: pt heavily reliant on RW and unable to extend bilateral knees at baseline   Stairs             Wheelchair Mobility    Modified Rankin (Stroke Patients Only)       Balance Overall balance assessment: Needs assistance Sitting-balance support: Feet supported, Bilateral upper extremity supported Sitting balance-Leahy Scale: Fair     Standing balance support: Reliant on assistive device for balance, Bilateral upper extremity supported Standing balance-Leahy Scale: Fair                              Cognition Arousal/Alertness: Awake/alert Behavior During Therapy: WFL for tasks assessed/performed Overall Cognitive Status: Within Functional Limits for tasks assessed                                 General Comments: pain limiting how much pt communicates        Exercises Total Joint Exercises Hip ABduction/ADduction: AROM, Both, 10 reps, Seated Long Arc Quad: AROM, 5 reps, Both, Seated    General Comments        Pertinent Vitals/Pain Pain Assessment Pain Assessment: 0-10 Pain Score: 5  Pain Location: R hip Pain Descriptors / Indicators: Aching, Discomfort Pain Intervention(s): Limited activity  within patient's tolerance, Monitored during session, Premedicated before session    Home Living Family/patient expects to be discharged to:: Private residence Living Arrangements: Spouse/significant other;Other relatives Available Help at Discharge: Available 24 hours/day;Family Type of Home: Apartment Home Access: Level entry       Home Layout: One level Home Equipment: Conservation officer, nature (2 wheels);Rollator (4 wheels);Cane - single point;Shower  seat;BSC/3in1      Prior Function            PT Goals (current goals can now be found in the care plan section) Acute Rehab PT Goals Patient Stated Goal: to go home PT Goal Formulation: With patient Time For Goal Achievement: 05/06/22 Potential to Achieve Goals: Good Progress towards PT goals: Progressing toward goals    Frequency    BID      PT Plan Current plan remains appropriate    Co-evaluation              AM-PAC PT "6 Clicks" Mobility   Outcome Measure  Help needed turning from your back to your side while in a flat bed without using bedrails?: A Lot Help needed moving from lying on your back to sitting on the side of a flat bed without using bedrails?: A Lot Help needed moving to and from a bed to a chair (including a wheelchair)?: A Lot Help needed standing up from a chair using your arms (e.g., wheelchair or bedside chair)?: A Lot Help needed to walk in hospital room?: A Lot Help needed climbing 3-5 steps with a railing? : Total 6 Click Score: 11    End of Session Equipment Utilized During Treatment: Gait belt Activity Tolerance: Patient limited by pain Patient left: in chair (with OT) Nurse Communication: Mobility status PT Visit Diagnosis: Unsteadiness on feet (R26.81);Other abnormalities of gait and mobility (R26.89);Muscle weakness (generalized) (M62.81);Difficulty in walking, not elsewhere classified (R26.2)     Time: 9826-4158 PT Time Calculation (min) (ACUTE ONLY): 11 min  Charges:                        Claiborne Billings O'Daniel, SPT  04/22/2022, 4:14 PM

## 2022-04-22 NOTE — Evaluation (Addendum)
Physical Therapy Evaluation Patient Details Name: Jack Blanchard MRN: [id number] DOB: 1951-03-15 Today's Date: 04/22/2022  History of Present Illness  71 yo male s/p R THA. PMH includes COPD, HTN, Substance abuse, and OSA.  Clinical Impression  Pt found in bed upon PT entry leaning on bedside railings with c/o 9/10 pain. Pt required max assist x2 for physical assist supine to sit. Lateral scoot transfer bed>chair max assist x3 for safety. Pt groaning in pain throughout mobility session and noted for increased RR, spO2 on 4L was 98%. Pt would benefit from skilled physical therapy to address the listed deficits (see below) to increase independence with ADLs and function. Current recommendation is SNF due to lack of mobility progression and safety concerns to return pt to PLOF.       Recommendations for follow up therapy are one component of a multi-disciplinary discharge planning process, led by the attending physician.  Recommendations may be updated based on patient status, additional functional criteria and insurance authorization.  Follow Up Recommendations Skilled nursing-short term rehab (<3 hours/day) Can patient physically be transported by private vehicle: No    Assistance Recommended at Discharge Frequent or constant Supervision/Assistance  Patient can return home with the following  A lot of help with walking and/or transfers;A lot of help with bathing/dressing/bathroom;Assistance with cooking/housework;Assist for transportation;Help with stairs or ramp for entrance    Equipment Recommendations None recommended by PT  Recommendations for Other Services  OT consult    Functional Status Assessment Patient has had a recent decline in their functional status and demonstrates the ability to make significant improvements in function in a reasonable and predictable amount of time.     Precautions / Restrictions Precautions Precautions: Anterior Hip Precaution Booklet Issued:  No Restrictions Weight Bearing Restrictions: Yes RLE Weight Bearing: Weight bearing as tolerated      Mobility  Bed Mobility Overal bed mobility: Needs Assistance Bed Mobility: Supine to Sit     Supine to sit: Max assist, +2 for physical assistance          Transfers Overall transfer level: Needs assistance   Transfers: Bed to chair/wheelchair/BSC            Lateral/Scoot Transfers: +2 physical assistance General transfer comment: +3 for safety    Ambulation/Gait                  Stairs            Wheelchair Mobility    Modified Rankin (Stroke Patients Only)       Balance Overall balance assessment: Needs assistance Sitting-balance support: Feet supported, Bilateral upper extremity supported Sitting balance-Leahy Scale: Fair                                       Pertinent Vitals/Pain Pain Assessment Pain Assessment: 0-10 Pain Score: 9  Pain Location: R hip Pain Descriptors / Indicators: Aching, Discomfort, Moaning Pain Intervention(s): Limited activity within patient's tolerance, Repositioned, Monitored during session, Premedicated before session    Home Living Family/patient expects to be discharged to:: Private residence Living Arrangements: Spouse/significant other;Other relatives Available Help at Discharge: Available 24 hours/day Type of Home: Apartment Home Access: Level entry       Home Layout: One level Home Equipment: Conservation officer, nature (2 wheels);Rollator (4 wheels);Cane - single point;Shower seat;BSC/3in1      Prior Function Prior Level of Function : Independent/Modified Independent;Driving  Hand Dominance        Extremity/Trunk Assessment   Upper Extremity Assessment Upper Extremity Assessment: Generalized weakness    Lower Extremity Assessment Lower Extremity Assessment: Generalized weakness    Cervical / Trunk Assessment Cervical / Trunk Assessment: Normal   Communication   Communication: No difficulties  Cognition Arousal/Alertness: Awake/alert Behavior During Therapy: WFL for tasks assessed/performed Overall Cognitive Status: Within Functional Limits for tasks assessed                                 General Comments: pain limiting how much pt communicates        General Comments      Exercises     Assessment/Plan    PT Assessment Patient needs continued PT services  PT Problem List Decreased strength;Decreased balance;Pain;Decreased range of motion;Decreased mobility;Decreased activity tolerance;Decreased safety awareness       PT Treatment Interventions DME instruction;Functional mobility training;Balance training;Patient/family education;Gait training;Therapeutic activities;Neuromuscular re-education;Stair training;Therapeutic exercise    PT Goals (Current goals can be found in the Care Plan section)  Acute Rehab PT Goals Patient Stated Goal: to control pain PT Goal Formulation: With patient Time For Goal Achievement: 05/06/22 Potential to Achieve Goals: Good    Frequency BID     Co-evaluation               AM-PAC PT "6 Clicks" Mobility  Outcome Measure Help needed turning from your back to your side while in a flat bed without using bedrails?: A Lot Help needed moving from lying on your back to sitting on the side of a flat bed without using bedrails?: A Lot Help needed moving to and from a bed to a chair (including a wheelchair)?: A Lot Help needed standing up from a chair using your arms (e.g., wheelchair or bedside chair)?: Total Help needed to walk in hospital room?: Total Help needed climbing 3-5 steps with a railing? : Total 6 Click Score: 9    End of Session Equipment Utilized During Treatment: Gait belt Activity Tolerance: Patient limited by pain Patient left: in chair;with call bell/phone within reach;with family/visitor present;with chair alarm set Nurse Communication: Mobility  status PT Visit Diagnosis: Unsteadiness on feet (R26.81);Other abnormalities of gait and mobility (R26.89);Muscle weakness (generalized) (M62.81);Difficulty in walking, not elsewhere classified (R26.2)    Time: 6160-7371 PT Time Calculation (min) (ACUTE ONLY): 29 min   Charges:   PT Evaluation $PT Eval Low Complexity: 1 Low PT Treatments $Therapeutic Activity: 23-37 mins       Delesa Kawa O'Daniel, SPT  04/22/2022, 11:05 AM

## 2022-04-22 NOTE — Anesthesia Postprocedure Evaluation (Signed)
Anesthesia Post Note  Patient: Jack Blanchard  Procedure(s) Performed: TOTAL HIP ARTHROPLASTY ANTERIOR APPROACH (Right: Hip)  Patient location during evaluation: PACU Anesthesia Type: General Level of consciousness: awake and alert Pain management: pain level controlled Vital Signs Assessment: post-procedure vital signs reviewed and stable Respiratory status: spontaneous breathing, nonlabored ventilation, respiratory function stable and patient connected to nasal cannula oxygen Cardiovascular status: blood pressure returned to baseline and stable Postop Assessment: no apparent nausea or vomiting Comments: Very loose tooth removed post induction pre intubation to prevent the diseased tooth from falling into the patients airway.  This was explained to the patient who voiced understanding, that the tooth had been very loose for a long time.   No notable events documented.   Last Vitals:  Vitals:   04/21/22 2349 04/22/22 0554  BP: 137/65 112/65  Pulse: 80 73  Resp: 18 18  Temp:  36.7 C  SpO2: 100% 100%    Last Pain:  Vitals:   04/22/22 0643  TempSrc:   PainSc: 8                  [name]

## 2022-04-22 NOTE — Progress Notes (Signed)
  Subjective:  Patient reports pain as moderate to severe.  Patient c/o back pain.  Objective:   VITALS:   Vitals:   04/21/22 1915 04/21/22 2009 04/21/22 2349 04/22/22 0554  BP: (!) 146/75 (!) 152/88 137/65 112/65  Pulse: 63 68 80 73  Resp: '11 16 18 18  '$ Temp:  (!) 97 F (36.1 C)  98.1 F (36.7 C)  TempSrc:    Oral  SpO2: 100% 100% 100% 100%  Weight:      Height:        PHYSICAL EXAM:  Neurologically intact ABD soft Neurovascular intact Sensation intact distally Intact pulses distally Dorsiflexion/Plantar flexion intact Incision: dressing C/D/I No cellulitis present Compartment soft  LABS  Results for orders placed or performed during the hospital encounter of 04/21/22 (from the past 24 hour(s))  Urine Drug Screen, Qualitative ([facility] only)     Status: Abnormal   Collection Time: 04/21/22  1:42 PM  Result Value Ref Range   Tricyclic, Ur Screen NONE DETECTED NONE DETECTED   Amphetamines, Ur Screen NONE DETECTED NONE DETECTED   MDMA (Ecstasy)Ur Screen NONE DETECTED NONE DETECTED   Cocaine Metabolite,Ur [place] NONE DETECTED NONE DETECTED   Opiate, Ur Screen NONE DETECTED NONE DETECTED   Phencyclidine (PCP) Ur S NONE DETECTED NONE DETECTED   Cannabinoid 50 Ng, Ur [place] POSITIVE (A) NONE DETECTED   Barbiturates, Ur Screen NONE DETECTED NONE DETECTED   Benzodiazepine, Ur Scrn NONE DETECTED NONE DETECTED   Methadone Scn, Ur NONE DETECTED NONE DETECTED    DG HIP UNILAT WITH PELVIS 1V RIGHT  Result Date: 04/21/2022 CLINICAL DATA:  Total right hip arthroplasty. Intraoperative fluoroscopy. EXAM: DG HIP (WITH OR WITHOUT PELVIS) 1V RIGHT COMPARISON:  Pelvis and left hip radiographs 09/03/2017 FINDINGS: Images were performed intraoperatively without the presence of a radiologist. The patient is undergoing total right hip arthroplasty. No hardware complication is seen. Total fluoroscopy images: 2 Total fluoroscopy time: 17 seconds Total dose: Radiation Exposure Index (as provided by the  fluoroscopic device): 4.60 mGy air Kerma Please see intraoperative findings for further detail. IMPRESSION: Intraoperative fluoroscopy for total right hip arthroplasty. Electronically Signed   By: Yvonne Kendall M.D.   On: 04/21/2022 17:53   DG C-Arm 1-60 Min-No Report  Result Date: 04/21/2022 Fluoroscopy was utilized by the requesting physician.  No radiographic interpretation.   DG C-Arm 1-60 Min-No Report  Result Date: 04/21/2022 Fluoroscopy was utilized by the requesting physician.  No radiographic interpretation.   DG C-Arm 1-60 Min-No Report  Result Date: 04/21/2022 Fluoroscopy was utilized by the requesting physician.  No radiographic interpretation.    Assessment/Plan: 1 Day Post-Op   Principal Problem:   History of total hip replacement, right   Advance diet Up with therapy Continue to watch hopeful discharge tomorrow   Carlynn Spry , PA-C 04/22/2022, 8:12 AM

## 2022-04-22 NOTE — Progress Notes (Signed)
Spoke with the patient about going to [facility] He said he will not be going to Rehab He said Genevieve Norlander  helps at home, she is a family friend He said she is there daily His grandson will be helping as well He said he wants [facility], he refuses to go to [facility] I explained that it took 3 people to help him get to the chair He said he is NOT going to no rehab, he is only agreeable to go home with [facility] PT, he does not have a preference for agencies I reached out to [place] [facility] [place] and asked if they are able to accept the patient

## 2022-04-22 NOTE — Plan of Care (Signed)
  Problem: Activity: Goal: Ability to tolerate increased activity will improve Outcome: Progressing    Problem: Skin Integrity: Goal: Will show signs of wound healing Outcome: Progressing   Problem: Education: Goal: Knowledge of General Education information will improve Description: Including pain rating scale, medication(s)/side effects and non-pharmacologic comfort measures Outcome: Progressing   Problem: Clinical Measurements: Goal: Will remain free from infection Outcome: Progressing   Problem: Coping: Goal: Level of anxiety will decrease Outcome: Progressing   Problem: Nutrition: Goal: Adequate nutrition will be maintained Outcome: Progressing

## 2022-04-22 NOTE — Evaluation (Signed)
Occupational Therapy Evaluation Patient Details Name: Jack Blanchard MRN: [id number] DOB: 1950-06-30 Today's Date: 04/22/2022   History of Present Illness 71 yo male s/p R THA. PMH includes COPD, HTN, Substance abuse, and OSA.   Clinical Impression   Patient presenting with decreased Ind in self care,balance, functional mobility/transfers, endurance, and safety awareness. Patient reports living at home with significant other and being Mod I level for self care tasks and they share IADLs. Pt transitioning from PT session and remains motivated and agreeable to OT intervention although he does report being fatigue. Pt stands with mod A from recliner chair and takes several steps over to bed and side steps closer to [facility]. Pt does appear to be slow processing cuing/commands for functional mobility. Controlled sit onto EOB with mod multimodal cuing for hand placement and technique. OT anticipates pt will need max A for LB self care. Sit >supine with mod A for B LEs to return to bed.  Patient will benefit from acute OT to increase overall independence in the areas of ADLs, functional mobility, and safety awareness in order to safely discharge to next venue of care.      Recommendations for follow up therapy are one component of a multi-disciplinary discharge planning process, led by the attending physician.  Recommendations may be updated based on patient status, additional functional criteria and insurance authorization.   Follow Up Recommendations  Skilled nursing-short term rehab (<3 hours/day)     Assistance Recommended at Discharge Frequent or constant Supervision/Assistance  Patient can return home with the following A lot of help with walking and/or transfers;A lot of help with bathing/dressing/bathroom;Assistance with cooking/housework;Help with stairs or ramp for entrance;Assist for transportation    Functional Status Assessment  Patient has had a recent decline in their functional status and  demonstrates the ability to make significant improvements in function in a reasonable and predictable amount of time.  Equipment Recommendations  Other (comment) (defer to next venue of care)       Precautions / Restrictions Precautions Precautions: Fall Precaution Booklet Issued: No Precaution Comments: direct anterior Restrictions Weight Bearing Restrictions: Yes RLE Weight Bearing: Weight bearing as tolerated      Mobility Bed Mobility Overal bed mobility: Needs Assistance Bed Mobility: Sit to Supine       Sit to supine: Mod assist   General bed mobility comments: assistance for B LEs to return to supine    Transfers Overall transfer level: Needs assistance   Transfers: Bed to chair/wheelchair/BSC, Sit to/from Stand Sit to Stand: Mod assist     Step pivot transfers: Min assist            Balance Overall balance assessment: Needs assistance Sitting-balance support: Feet supported, Bilateral upper extremity supported Sitting balance-Leahy Scale: Fair                                     ADL either performed or assessed with clinical judgement   ADL Overall ADL's : Needs assistance/impaired     Grooming: Wash/dry hands;Wash/dry face;Sitting;Set up;Supervision/safety                   Toilet Transfer: Moderate assistance;Rolling walker (2 wheels) Toilet Transfer Details (indicate cue type and reason): simulated         Functional mobility during ADLs: Minimal assistance;Moderate assistance;Rolling walker (2 wheels)       Vision Patient Visual Report: No change from baseline  Pertinent Vitals/Pain Pain Assessment Pain Assessment: 0-10 Pain Score: 4  Pain Location: R hip Pain Descriptors / Indicators: Aching, Discomfort, Moaning Pain Intervention(s): Limited activity within patient's tolerance, Repositioned, Premedicated before session     Hand Dominance Right   Extremity/Trunk Assessment Upper Extremity  Assessment Upper Extremity Assessment: Generalized weakness   Lower Extremity Assessment Lower Extremity Assessment: Generalized weakness   Cervical / Trunk Assessment Cervical / Trunk Assessment: Normal   Communication Communication Communication: No difficulties   Cognition Arousal/Alertness: Awake/alert Behavior During Therapy: WFL for tasks assessed/performed Overall Cognitive Status: Within Functional Limits for tasks assessed                                 General Comments: Pt answers orientation questions correctly but is slow to process directions and sequencing for functional mobility tasks requiring increased cuing for directions                Home Living Family/patient expects to be discharged to:: Private residence Living Arrangements: Spouse/significant other;Other relatives Available Help at Discharge: Available 24 hours/day;Family Type of Home: Apartment Home Access: Level entry     Home Layout: One level     Bathroom Shower/Tub: Tub/shower unit     Bathroom Accessibility: Yes   Home Equipment: Conservation officer, nature (2 wheels);Rollator (4 wheels);Cane - single point;Shower seat;BSC/3in1          Prior Functioning/Environment Prior Level of Function : Independent/Modified Independent;Driving               ADLs Comments: Pt reports being mod I for self care tasks and partner assists with IADLs        OT Problem List: Decreased strength;Decreased activity tolerance;Impaired balance (sitting and/or standing);Obesity;Decreased safety awareness      OT Treatment/Interventions: Self-care/ADL training;Therapeutic exercise;Therapeutic activities;Energy conservation;DME and/or AE instruction;Patient/family education;Balance training;Cognitive remediation/compensation    OT Goals(Current goals can be found in the care plan section) Acute Rehab OT Goals Patient Stated Goal: return to PLOF OT Goal Formulation: With patient Time For Goal  Achievement: 05/06/22 Potential to Achieve Goals: Good ADL Goals Pt Will Perform Grooming: with supervision;standing Pt Will Perform Lower Body Dressing: with min guard assist;sit to/from stand Pt Will Transfer to Toilet: with min guard assist;ambulating Pt Will Perform Toileting - Clothing Manipulation and hygiene: with min guard assist;sit to/from stand  OT Frequency: Min 2X/week       AM-PAC OT "6 Clicks" Daily Activity     Outcome Measure Help from another person eating meals?: None Help from another person taking care of personal grooming?: A Little Help from another person toileting, which includes using toliet, bedpan, or urinal?: A Lot Help from another person bathing (including washing, rinsing, drying)?: A Lot Help from another person to put on and taking off regular upper body clothing?: A Little Help from another person to put on and taking off regular lower body clothing?: A Lot 6 Click Score: 16   End of Session Equipment Utilized During Treatment: Rolling walker (2 wheels) Nurse Communication: Mobility status  Activity Tolerance: Patient tolerated treatment well Patient left: in bed;with call bell/phone within reach;with bed alarm set  OT Visit Diagnosis: Unsteadiness on feet (R26.81);Muscle weakness (generalized) (M62.81);Pain Pain - Right/Left: Right Pain - part of body: Hip                Time: 1351-1407 OT Time Calculation (min): 16 min Charges:  OT General Charges $OT Visit: 1 Visit  OT Evaluation $OT Eval Moderate Complexity: 1 Mod OT Treatments $Therapeutic Activity: 8-22 mins  Darleen Crocker, MS, OTR/L , CBIS ascom [phone]  04/22/22, 2:27 PM

## 2022-04-23 LAB — SURGICAL PATHOLOGY

## 2022-04-23 MED ORDER — ASPIRIN 81 MG PO CHEW: 81.0000 mg | CHEWABLE_TABLET | Freq: Two times a day (BID) | ORAL | 0 refills | Status: AC

## 2022-04-23 MED ORDER — DOCUSATE SODIUM 100 MG PO CAPS: 100.0000 mg | ORAL_CAPSULE | Freq: Two times a day (BID) | ORAL | 0 refills | Status: AC

## 2022-04-23 MED ORDER — METHOCARBAMOL 750 MG PO TABS: 750.0000 mg | ORAL_TABLET | Freq: Three times a day (TID) | ORAL | 1 refills | Status: AC | PRN

## 2022-04-23 MED ORDER — OXYCODONE HCL 10 MG PO TABS: 10.0000 mg | ORAL_TABLET | ORAL | 0 refills | Status: AC | PRN

## 2022-04-23 NOTE — Progress Notes (Signed)
  Subjective:  Patient reports pain as moderate.    Objective:   VITALS:   Vitals:   04/22/22 0554 04/22/22 0818 04/22/22 1642 04/22/22 2312  BP: 112/65 (!) 108/50 (!) 123/56 (!) 115/55  Pulse: 73 77 95 98  Resp: _0 Temp: 98.1 F (36.7 C) 97.6 F (36.4 C) 98.8 F (37.1 C) 98 F (36.7 C)  TempSrc: Oral     SpO2: 100% 100% 98% 96%  Weight:      Height:        PHYSICAL EXAM:  Neurologically intact ABD soft Neurovascular intact Sensation intact distally Intact pulses distally Dorsiflexion/Plantar flexion intact Incision: dressing C/D/I and drsg changed No cellulitis present Compartment soft  LABS  No results found for this or any previous visit (from the past 24 hour(s)).  DG HIP UNILAT WITH PELVIS 1V RIGHT  Result Date: 04/21/2022 CLINICAL DATA:  Total right hip arthroplasty. Intraoperative fluoroscopy. EXAM: DG HIP (WITH OR WITHOUT PELVIS) 1V RIGHT COMPARISON:  Pelvis and left hip radiographs 09/03/2017 FINDINGS: Images were performed intraoperatively without the presence of a radiologist. The patient is undergoing total right hip arthroplasty. No hardware complication is seen. Total fluoroscopy images: 2 Total fluoroscopy time: 17 seconds Total dose: Radiation Exposure Index (as provided by the fluoroscopic device): 4.60 mGy air Kerma Please see intraoperative findings for further detail. IMPRESSION: Intraoperative fluoroscopy for total right hip arthroplasty. Electronically Signed   By: Yvonne Kendall M.D.   On: 04/21/2022 17:53   DG C-Arm 1-60 Min-No Report  Result Date: 04/21/2022 Fluoroscopy was utilized by the requesting physician.  No radiographic interpretation.   DG C-Arm 1-60 Min-No Report  Result Date: 04/21/2022 Fluoroscopy was utilized by the requesting physician.  No radiographic interpretation.   DG C-Arm 1-60 Min-No Report  Result Date: 04/21/2022 Fluoroscopy was utilized by the requesting physician.  No radiographic interpretation.     Assessment/Plan: 2 Days Post-Op   Principal Problem:   History of total hip replacement, right   Up with therapy May discharge home today if PT goals met with HHPT   Carlynn Spry , PA-C 04/23/2022, 7:02 AM

## 2022-04-23 NOTE — TOC Progression Note (Signed)
Transition of Care [facility]) - Progression Note    Patient Details  Name: Jack Blanchard MRN: [id number] Date of Birth: 1951/03/28  Transition of Care [facility]) CM/SW [place], RN Phone Number: 04/23/2022, 9:09 AM  Clinical Narrative:    Checking to see if the patient is already set up with Home health services He has a shower chair but need a 3 in1, [employer] to deliver to the bedside He has assistance at home   Expected Discharge Plan: [place] Barriers to Discharge: Barriers Resolved  Expected Discharge Plan and Services Expected Discharge Plan: [place]   Discharge Planning Services: CM Consult   Living arrangements for the past 2 months: Single Family Home Expected Discharge Date: 04/23/22               DME Arranged: N/A DME Agency: NA       HH Arranged: PT [place] Agency: [place] ([facility]) Date HH Agency Contacted: 04/23/22 Time [place]: [phone] Representative spoke with at [place]: Corene Cornea   Social Determinants of Health ([place]) Interventions    Readmission Risk Interventions     No data to display

## 2022-04-23 NOTE — Progress Notes (Signed)
Physical Therapy Treatment Patient Details Name: Jack Blanchard MRN: [id number] DOB: [birth year] Today's Date: 04/23/2022   History of Present Illness 71 yo male s/p R THA. PMH includes COPD, HTN, Substance abuse, and OSA.    PT Comments    Pt found in chair upon PT entry with c/o 6/10 in R hip. Sit<>Stand with CGA that progressed to supervision. Pt ambulated 100 ft with CGA that progressed to supervision with RW. No LOB noted throughout mobility and pt reported no increase in pain after ambulation. Pt would benefit from skilled physical therapy to address the listed deficits (see below) to increase independence with ADLs and function. Current recommendation updated to HHPT with intermittent assist due to pt progression in mobility.      Recommendations for follow up therapy are one component of a multi-disciplinary discharge planning process, led by the attending physician.  Recommendations may be updated based on patient status, additional functional criteria and insurance authorization.  Follow Up Recommendations  Home health PT Can patient physically be transported by private vehicle: Yes   Assistance Recommended at Discharge Intermittent Supervision/Assistance  Patient can return home with the following Assistance with cooking/housework;Assist for transportation;Help with stairs or ramp for entrance;A little help with walking and/or transfers;A little help with bathing/dressing/bathroom   Equipment Recommendations  BSC/3in1    Recommendations for Other Services       Precautions / Restrictions Precautions Precautions: Fall Precaution Booklet Issued: Yes (comment) Precaution Comments: direct anterior Restrictions Weight Bearing Restrictions: Yes RLE Weight Bearing: Weight bearing as tolerated     Mobility  Bed Mobility                    Transfers Overall transfer level: Needs assistance Equipment used: Rolling walker (2 wheels) Transfers: Sit to/from Stand Sit to  Stand: Supervision, Min guard                Ambulation/Gait Ambulation/Gait assistance: Supervision, Min guard Gait Distance (Feet): 100 Feet Assistive device: Rolling walker (2 wheels) Gait Pattern/deviations: Step-to pattern, Antalg[place]       General Gait Details: pt heavily reliant on RW and unable to extend bilateral knees at baseline   Stairs             Wheelchair Mobility    Modified Rankin (Stroke Patients Only)       Balance Overall balance assessment: Needs assistance Sitting-balance support: Feet supported, Bilateral upper extremity supported Sitting balance-Leahy Scale: Good     Standing balance support: Reliant on assistive device for balance, Bilateral upper extremity supported Standing balance-Leahy Scale: Fair                              Cognition Arousal/Alertness: Awake/alert Behavior During Therapy: WFL for tasks assessed/performed Overall Cognitive Status: Within Functional Limits for tasks assessed                                          Exercises      General Comments        Pertinent Vitals/Pain Pain Assessment Pain Score: 6  Pain Location: R hip Pain Descriptors / Indicators: Aching, Discomfort Pain Intervention(s): Limited activity within patient's tolerance, Repositioned, Monitored during session, Premedicated before session    Home Living  Prior Function            PT Goals (current goals can now be found in the care plan section) Acute Rehab PT Goals Patient Stated Goal: to go home PT Goal Formulation: With patient Time For Goal Achievement: 05/06/22 Potential to Achieve Goals: Good Progress towards PT goals: Progressing toward goals    Frequency    BID      PT Plan Discharge plan needs to be updated;Equipment recommendations need to be updated    Co-evaluation              AM-PAC PT "6 Clicks" Mobility   Outcome Measure   Help needed turning from your back to your side while in a flat bed without using bedrails?: A Little Help needed moving from lying on your back to sitting on the side of a flat bed without using bedrails?: A Little Help needed moving to and from a bed to a chair (including a wheelchair)?: A Little Help needed standing up from a chair using your arms (e.g., wheelchair or bedside chair)?: A Little Help needed to walk in hospital room?: A Little Help needed climbing 3-5 steps with a railing? : A Lot 6 Click Score: 17    End of Session Equipment Utilized During Treatment: Gait belt Activity Tolerance: Patient tolerated treatment well Patient left: in chair;with call bell/phone within reach;with family/visitor present Nurse Communication: Mobility status PT Visit Diagnosis: Unsteadiness on feet (R26.81);Other abnormalities of gait and mobility (R26.89);Muscle weakness (generalized) (M62.81);Difficulty in walking, not elsewhere classified (R26.2)     Time: 2774-1287 PT Time Calculation (min) (ACUTE ONLY): 15 min  Charges:                        Claiborne Billings O'Daniel, SPT  04/23/2022, 9:55 AM

## 2022-04-23 NOTE — Discharge Instructions (Signed)

## 2022-04-23 NOTE — Progress Notes (Addendum)
Patient is not able to walk the distance required to go the bathroom, or he/she is unable to safely negotiate stairs required to access the bathroom.  A 3in1 BSC will alleviate this problem  

## 2022-04-23 NOTE — TOC Progression Note (Signed)
Transition of Care [facility]) - Progression Note    Patient Details  Name: Jack Blanchard MRN: [id number] Date of Birth: 1950/09/26  Transition of Care [facility]) CM/SW [place], RN Phone Number: 04/23/2022, 10:41 AM  Clinical Narrative:    Patient reports that he is not currently set up with [facility] and says he does not have a preference of which agency, I reached out to [facility] to see if they can accept the patient, awaiting a response   Expected Discharge Plan: [place] Barriers to Discharge: Barriers Resolved  Expected Discharge Plan and Services Expected Discharge Plan: [place]   Discharge Planning Services: CM Consult   Living arrangements for the past 2 months: Single Family Home Expected Discharge Date: 04/23/22               DME Arranged: 3-N-1 DME Agency: [employer] Date DME Agency Contacted: 04/23/22 Time DME Agency Contacted: [phone] Representative spoke with at DME Agency: [place]: PT [place]: [place] ([place]) Date [place]: 04/23/22 Time [place]: [phone] Representative spoke with at [place]: [place] Determinants of Health ([place]) Interventions    Readmission Risk Interventions     No data to display

## 2022-04-23 NOTE — Discharge Summary (Signed)
Physician Discharge Summary  Patient ID: Jack Blanchard MRN: [id number] DOB/AGE: 1951/01/21 71 y.o.  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, right  Discharge Diagnoses:  M16.11 Unilateral primary osteoarthritis, right hip Principal Problem:   History of total hip replacement, right   Past Medical History:  Diagnosis Date   Arthritis    Asthma    Back pain    Bradycardia    DDD (degenerative disc disease), lumbosacral    Dyspnea    Fibromyalgia    History of degenerative disc disease    HLD (hyperlipidemia)    HTN (hypertension)    OSA (obstructive sleep apnea)    Peripheral neuropathy    Polysubstance abuse ([place])    a.) marijuana + cocaine   Tobacco use    Vitamin D deficiency     Surgeries: Procedure(s): TOTAL HIP ARTHROPLASTY ANTERIOR APPROACH on 04/21/2022   Consultants (if any):   Discharged Condition: Improved  Hospital Course: Jack Blanchard is an 71 y.o. 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    He was given perioperative antibiotics:  Anti-infectives (From admission, onward)    Start     Dose/Rate Route Frequency Ordered Stop   04/21/22 2130  ceFAZolin (ANCEF) IVPB 2g/100 mL premix        2 g 200 mL/hr over 30 Minutes Intravenous Every 6 hours 04/21/22 2031 04/22/22 0323   04/21/22 1356  ceFAZolin (ANCEF) IVPB 3g/100 mL premix        3 g 200 mL/hr over 30 Minutes Intravenous 30 min pre-op 04/21/22 1356 04/21/22 1552     .  He was given sequential compression devices, early ambulation, and aspirin 81 mg twice daily for 30 days for DVT prophylaxis.  He benefited maximally from the hospital stay and there were no complications.    Recent vital signs:   Vitals:   04/22/22 1642 04/22/22 2312  BP: (!) 123/56 (!) 115/55  Pulse: 95 98  Resp: 19 18  Temp: 98.8 F (37.1 C) 98 F (36.7 C)  SpO2: 98% 96%    Recent laboratory studies:  Lab Results  Component Value Date   HGB 13.7 03/30/2022   HGB 13.3 03/03/2022   HGB 13.9 12/25/2020   Lab Results  Component Value Date   WBC 7.1 03/30/2022   PLT 224 03/30/2022   No results found for: "INR" Lab Results  Component Value Date   NA 141 03/30/2022   K 3.9 03/30/2022   CL 109 03/30/2022   CO2 22 03/30/2022   BUN 15 03/30/2022   CREATININE 0.91 03/30/2022   GLUCOSE 103 (H) 03/30/2022    Discharge Medications:   Allergies as of 04/23/2022       Reactions   Doxycycline Calcium    Other reaction(s): Unknown   Vancomycin Itching, Swelling   angioedema   Acetaminophen Palpitations        Medication List     STOP taking these medications    tiZANidine 4 MG tablet Commonly known as: ZANAFLEX       TAKE these medications    albuterol (2.5 MG/3ML) 0.083% nebulizer solution Commonly known as: PROVENTIL Take 2.5 mg by nebulization every 4 (four) hours as needed for wheezing or shortness of breath.   albuterol 108 (90 Base)  MCG/ACT inhaler Commonly known as: VENTOLIN HFA Inhale 2 puffs into the lungs every 4 (four) hours as needed for wheezing or shortness of breath.   amoxicillin-clavulanate 500-125 MG tablet Commonly known as: Augmentin Take 1 tablet by mouth 2 (two) times daily.   aspirin 81 MG chewable tablet Chew 1 tablet (81 mg total) by mouth 2 (two) times daily.   atorvastatin 20 MG tablet Commonly known as: LIPITOR Take 20 mg by mouth at bedtime.   B-12 PO Take 1 tablet by mouth daily.   baclofen 10 MG tablet Commonly known as: LIORESAL Take 1-2 tablets (10-20 mg total) by mouth 3 (three) times daily for 30 days. What changed: how much to take   budesonide-formoterol 160-4.5 MCG/ACT inhaler Commonly known as: SYMBICORT Inhale 2 puffs into the  lungs 2 (two) times daily.   CALCIUM PO Take 1 tablet by mouth daily.   celecoxib 200 MG capsule Commonly known as: CELEBREX Take 1 capsule (200 mg total) by mouth 2 (two) times daily for 30 days. What changed: how much to take   docusate sodium 100 MG capsule Commonly known as: COLACE Take 1 capsule (100 mg total) by mouth 2 (two) times daily.   DULoxetine 60 MG capsule Commonly known as: CYMBALTA Take 60 mg by mouth daily.   furosemide 20 MG tablet Commonly known as: LASIX Take 20 mg by mouth daily.   gabapentin 300 MG capsule Commonly known as: Neurontin Start by taking 1 cap (300 mg) PO HS. Increase by 1 cap every 7 days until taking 3 cap (900 mg) PO HS. What changed:  how much to take how to take this when to take this additional instructions   gabapentin 600 MG tablet Commonly known as: NEURONTIN Take 600 mg by mouth 3 (three) times daily. What changed: Another medication with the same name was changed. Make sure you understand how and when to take each.   MAGNESIUM PO Take 1 tablet by mouth daily.   methocarbamol 750 MG tablet Commonly known as: ROBAXIN Take 1 tablet (750 mg total) by mouth every 8 (eight) hours as needed for muscle spasms. What changed:  when to take this reasons to take this   mirabegron ER 50 MG Tb24 tablet Commonly known as: MYRBETRIQ Take 1 tablet (50 mg total) by mouth daily.   Oxycodone HCl 10 MG Tabs Take 1 tablet (10 mg total) by mouth every 4 (four) hours as needed for up to 7 days.   tamsulosin 0.4 MG Caps capsule Commonly known as: FLOMAX Take 1 capsule (0.4 mg total) by mouth daily.               Durable Medical Equipment  (From admission, onward)           Start     Ordered   04/23/22 0709  For home use only DME 3 n 1  Once        04/23/22 0708   04/23/22 0709  For home use only DME Walker rolling  Once       Question Answer Comment  Walker: With 5 Inch Wheels   Patient needs a walker to treat with  the following condition Osteoarthritis of right hip      04/23/22 0708            Diagnostic Studies: DG HIP UNILAT WITH PELVIS 1V RIGHT  Result Date: 04/21/2022 CLINICAL DATA:  Total right hip arthroplasty. Intraoperative fluoroscopy. EXAM: DG HIP (WITH OR WITHOUT PELVIS) 1V RIGHT COMPARISON:  Pelvis and left hip radiographs 09/03/2017 FINDINGS: Images were performed intraoperatively without the presence of a radiologist. The patient is undergoing total right hip arthroplasty. No hardware complication is seen. Total fluoroscopy images: 2 Total fluoroscopy time: 17 seconds Total dose: Radiation Exposure Index (as provided by the fluoroscopic device): 4.60 mGy air Kerma Please see intraoperative findings for further detail. IMPRESSION: Intraoperative fluoroscopy for total right hip arthroplasty. Electronically Signed   By: Yvonne Kendall M.D.   On: 04/21/2022 17:53   DG C-Arm 1-60 Min-No Report  Result Date: 04/21/2022 Fluoroscopy was utilized by the requesting physician.  No radiographic interpretation.   DG C-Arm 1-60 Min-No Report  Result Date: 04/21/2022 Fluoroscopy was utilized by the requesting physician.  No radiographic interpretation.   DG C-Arm 1-60 Min-No Report  Result Date: 04/21/2022 Fluoroscopy was utilized by the requesting physician.  No radiographic interpretation.    Disposition: Discharge disposition: 01-Home or Self Care       Discharge Instructions     Face-to-face encounter (required for Medicare/Medicaid patients)   Complete by: As directed    I Carlynn Spry certify that this patient is under my care and that I, or a nurse practitioner or physician's assistant working with me, had a face-to-face encounter that meets the physician face-to-face encounter requirements with this patient on 04/23/2022. The encounter with the patient was in whole, or in part for the following medical condition(s) which is the primary reason for home health care (List medical  condition): s/p right total hip arthroplasty   The encounter with the patient was in whole, or in part, for the following medical condition, which is the primary reason for home health care: right hip osteoarthritis   I certify that, based on my findings, the following services are medically necessary home health services: Physical therapy   Reason for Medically Necessary Home Health Services: Therapy- Instruction on use of Assistive Device for Ambulation on all Surfaces   My clinical findings support the need for the above services: Unable to leave home safely without assistance and/or assistive device   Further, I certify that my clinical findings support that this patient is homebound due to: Ambulates short distances less than 300 feet   Home Health   Complete by: As directed    To provide the following care/treatments: PT          Signed: Carlynn Spry ,PA-C 04/23/2022, 7:09 AM

## 2022-04-23 NOTE — Plan of Care (Signed)
  Problem: Education: Goal: Understanding of discharge needs will improve Outcome: Progressing   Problem: Clinical Measurements: Goal: Postoperative complications will be avoided or minimized Outcome: Progressing   Problem: Clinical Measurements: Goal: Ability to maintain clinical measurements within normal limits will improve Outcome: Progressing   Problem: Nutrition: Goal: Adequate nutrition will be maintained Outcome: Progressing   Problem: Pain Managment: Goal: General experience of comfort will improve Outcome: Progressing   Problem: Safety: Goal: Ability to remain free from injury will improve Outcome: Progressing   Problem: Skin Integrity: Goal: Risk for impaired skin integrity will decrease Outcome: Progressing

## 2022-05-13 NOTE — H&P (Signed)
NAME: Alize Acy MRN:   [id number] DOB:   1951-02-24     HISTORY AND PHYSICAL   Expand All Collapse All    NAME: Jack Blanchard MRN:   [id number] DOB:   [birth year]                                       HISTORY AND PHYSICAL   CHIEF COMPLAINT:  right hip pain   HISTORY:   Jack Blanchard a 71 y.o. male  with right  Hip Pain Patient complains of right hip pain. Onset of the symptoms was several years ago. Inciting event: known DJD. The patient reports the hip pain is worse with weight bearing. Associated symptoms: none. Aggravating symptoms include: any weight bearing. Patient has had no prior hip problems. Previous visits for this problem: multiple, this is a longstanding diagnosis. Last seen several weeks ago by me. Evaluation to date: plain films, which were abnormal  osteoarthritis . Treatment to date: OTC analgesics, which have been somewhat effective, prescription analgesics, which have been somewhat effective, and home exercise program, which has been somewhat effective.    Plan for right total hip replacement   PAST MEDICAL HISTORY:       Past Medical History:  Diagnosis Date   Asthma     Back pain     Fibromyalgia     History of degenerative disc disease        PAST SURGICAL HISTORY:        Past Surgical History:  Procedure Laterality Date   BACK SURGERY        lumbar surgery with rods placed   COLONOSCOPY WITH PROPOFOL N/A 08/03/2017    Procedure: COLONOSCOPY WITH PROPOFOL;  Surgeon: Lin Landsman, MD;  Location: [facility] ENDOSCOPY;  Service: Gastroenterology;  Laterality: N/A;   COLONOSCOPY WITH PROPOFOL N/A 08/06/2020    Procedure: COLONOSCOPY WITH PROPOFOL;  Surgeon: Lin Landsman, MD;  Location: [facility] ENDOSCOPY;  Service: Gastroenterology;  Laterality: N/A;   JOINT REPLACEMENT Bilateral     LEG SURGERY       TOTAL KNEE ARTHROPLASTY Right        MEDICATIONS:  (Not in a hospital admission)     ALLERGIES:        Allergies  Allergen Reactions   Doxycycline Calcium         Other reaction(s): Unknown   Vancomycin Itching and Swelling      angioedema   Acetaminophen Palpitations      REVIEW OF SYSTEMS:   Negative except HPI   FAMILY HISTORY:        Family History  Problem Relation Age of Onset   Cancer Mother     Hyperlipidemia Son     Cancer Maternal Uncle     Cancer Maternal Grandfather        SOCIAL HISTORY:   reports that he has been smoking cigars. He has never used smokeless tobacco. He reports current alcohol use. He reports current drug use. Drug: Marijuana.   PHYSICAL EXAM:  General appearance: alert, cooperative, and no distress Neck: no JVD and supple, symmetrical, trachea midline Resp: clear to auscultation bilaterally Cardio: regular rate and rhythm, S1, S2 normal, no murmur, click, rub or gallop GI: soft, non-tender; bowel sounds normal; no masses,  no organomegaly Extremities: extremities normal, atraumatic, no cyanosis or edema and Homans sign is negative, no sign of DVT Pulses: 2+ and symmetric Skin: Skin  color, texture, turgor normal. No rashes or lesions      LABORATORY STUDIES: Recent Labs (last 2 labs)  No results for input(s): "WBC", "HGB", "HCT", "PLT" in the last 72 hours.                Recent Labs (last 2 labs)  No results for input(s): "NA", "K", "CL", "CO2", "GLUCOSE", "BUN", "CREATININE", "CALCIUM" in the last 72 hours.     STUDIES/RESULTS:              Imaging Results  No results found.     ASSESSMENT:  End stage osteoarthritis right hip                              Active Problems:   * No active hospital problems. *     PLAN:  Right Primary Total Hip         Carlynn Spry 05/13/2022. 11:17 AM

## 2022-05-22 ENCOUNTER — Other Ambulatory Visit: Payer: Self-pay

## 2022-05-22 ENCOUNTER — Emergency Department
Admission: EM | Admit: 2022-05-22 | Discharge: 2022-05-22 | Disposition: A | Discharge status: Home / Self Care | Payer: 59 | Attending: Emergency Medicine | Admitting: Emergency Medicine

## 2022-05-22 DIAGNOSIS — I10 Essential (primary) hypertension: Secondary | ICD-10-CM | POA: Insufficient documentation

## 2022-05-22 DIAGNOSIS — Z96641 Presence of right artificial hip joint: Secondary | ICD-10-CM | POA: Diagnosis not present

## 2022-05-22 DIAGNOSIS — T8131XD Disruption of external operation (surgical) wound, not elsewhere classified, subsequent encounter: Secondary | ICD-10-CM | POA: Diagnosis not present

## 2022-05-22 DIAGNOSIS — Z4801 Encounter for change or removal of surgical wound dressing: Secondary | ICD-10-CM | POA: Diagnosis not present

## 2022-05-22 DIAGNOSIS — Z5189 Encounter for other specified aftercare: Secondary | ICD-10-CM

## 2022-05-22 NOTE — ED Provider Notes (Signed)
[facility] Provider Note    Event Date/Time   First MD Initiated Contact with Patient 05/22/22 1532     (approximate)   History   Wound Check   HPI  Jack Blanchard is a 72 y.o. male with a history of hypertension, peripheral neuropathy, recent right hip replacement who comes to the ED for evaluation of open surgical wound at the right hip.  He reports his postoperative course was complicated by infection of the surgical incision.  He has taken a course of antibiotics already.  He previously had some thick drainage from the wound, but no new drainage.  He denies any significant pain from the area or worsening swelling.  No fever.  No chest pain or shortness of breath.     Physical Exam   Triage Vital Signs: ED Triage Vitals  Enc Vitals Group     BP 05/22/22 1402 (!) 147/65     Pulse Rate 05/22/22 1402 72     Resp 05/22/22 1402 18     Temp 05/22/22 1402 98 F (36.7 C)     Temp src --      SpO2 05/22/22 1402 97 %     Weight --      Height --      Head Circumference --      Peak Flow --      Pain Score 05/22/22 1401 3     Pain Loc --      Pain Edu? --      Excl. in [place]? --     Most recent vital signs: Vitals:   05/22/22 1402  BP: (!) 147/65  Pulse: 72  Resp: 18  Temp: 98 F (36.7 C)  SpO2: 97%    General: Awake, no distress.  CV:  Good peripheral perfusion.  Resp:  Normal effort.  Abd:  No distention.  Other:  Right anterior thigh surgical incision displays about 2 cm of dehiscence in the middle of the incision.  There is fibrinous exudate overlying the soft tissue bed which is 5 mm deep to the surface.  No purulent drainage, no fluctuance.  Nontender.  There is surrounding scar tissue, but the wound is not indurated or warm.  No crepitus.   ED Results / Procedures / Treatments   Labs (all labs ordered are listed, but only abnormal results are displayed) Labs Reviewed - No data to  display   RADIOLOGY    PROCEDURES:  Procedures   MEDICATIONS ORDERED IN ED: Medications - No data to display   IMPRESSION / MDM / [place] / ED COURSE  I reviewed the triage vital signs and the nursing notes.                              Patient presents for evaluation of his wound healing.  He has so far been following up in orthopedic clinic, doing regular dressing changes.  Wound today does not appear to be infected.  It shows signs of healing secondarily.  The wound is relatively superficial.  The patient is not having any new symptoms.  Vital signs are normal.  Counseled the patient on wet-to-dry dressings and provided supplies for him to continue doing this at home.  He can continue to follow-up with orthopedic clinic while being referred to the wound care center.       FINAL CLINICAL IMPRESSION(S) / ED DIAGNOSES   Final diagnoses:  Visit for  wound check  Surgical wound dehiscence, subsequent encounter     Rx / DC Orders   ED Discharge Orders     None        Note:  This document was prepared using Dragon voice recognition software and may include unintentional dictation errors.   Carrie Mew, MD 05/22/22 [phone]

## 2022-05-22 NOTE — ED Notes (Signed)
Wound care completed by this Probation officer. Pt appreciative. Pt wheeled to lobby by NT.

## 2022-05-22 NOTE — ED Triage Notes (Signed)
Pt comes with c/o right hip wound. Pt states this started back in November. Pt has been going to MD to have it followed. Pt states he had staples removed already and MD just wants him to get it checked out. No fever at home.

## 2022-05-22 NOTE — Discharge Instructions (Signed)
Continue to apply wet-to-dry dressings two times a day and follow up with your orthopedic office until you get scheduled for the [place].

## 2022-05-25 ENCOUNTER — Encounter: Payer: Self-pay | Admitting: Orthopedic Surgery
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
## 2022-05-26 ENCOUNTER — Encounter: Payer: Self-pay | Admitting: Orthopedic Surgery

## 2022-05-26 ENCOUNTER — Ambulatory Visit: Payer: 59 | Admitting: General Practice

## 2022-05-26 ENCOUNTER — Other Ambulatory Visit: Payer: Self-pay

## 2022-05-26 ENCOUNTER — Other Ambulatory Visit: Payer: Self-pay | Admitting: Orthopedic Surgery

## 2022-05-26 ENCOUNTER — Encounter
Admission: RE | Disposition: A | Discharge status: Home / Self Care | Payer: Self-pay | Source: Ambulatory Visit | Attending: Orthopedic Surgery

## 2022-05-26 ENCOUNTER — Ambulatory Visit
Admission: RE | Admit: 2022-05-26 | Discharge: 2022-05-26 | Disposition: A | Discharge status: Home / Self Care | Payer: 59 | Source: Ambulatory Visit | Attending: Orthopedic Surgery | Admitting: Orthopedic Surgery

## 2022-05-26 DIAGNOSIS — F17290 Nicotine dependence, other tobacco product, uncomplicated: Secondary | ICD-10-CM | POA: Diagnosis not present

## 2022-05-26 DIAGNOSIS — T8131XA Disruption of external operation (surgical) wound, not elsewhere classified, initial encounter: Secondary | ICD-10-CM | POA: Insufficient documentation

## 2022-05-26 DIAGNOSIS — I10 Essential (primary) hypertension: Secondary | ICD-10-CM | POA: Insufficient documentation

## 2022-05-26 DIAGNOSIS — G4733 Obstructive sleep apnea (adult) (pediatric): Secondary | ICD-10-CM | POA: Insufficient documentation

## 2022-05-26 DIAGNOSIS — Y838 Other surgical procedures as the cause of abnormal reaction of the patient, or of later complication, without mention of misadventure at the time of the procedure: Secondary | ICD-10-CM | POA: Diagnosis not present

## 2022-05-26 DIAGNOSIS — J45909 Unspecified asthma, uncomplicated: Secondary | ICD-10-CM | POA: Insufficient documentation

## 2022-05-26 HISTORY — PX: APPLICATION OF WOUND VAC: SHX5189

## 2022-05-26 HISTORY — PX: INCISION AND DRAINAGE OF WOUND: SHX1803

## 2022-05-26 LAB — URINE DRUG SCREEN, QUALITATIVE (ARMC ONLY)
Amphetamines, Ur Screen: NOT DETECTED
Barbiturates, Ur Screen: NOT DETECTED
Benzodiazepine, Ur Scrn: NOT DETECTED
Cannabinoid 50 Ng, Ur ~~LOC~~: POSITIVE — AB
Cocaine Metabolite,Ur ~~LOC~~: POSITIVE — AB
MDMA (Ecstasy)Ur Screen: NOT DETECTED
Methadone Scn, Ur: NOT DETECTED
Opiate, Ur Screen: NOT DETECTED
Phencyclidine (PCP) Ur S: NOT DETECTED
Tricyclic, Ur Screen: NOT DETECTED

## 2022-05-26 SURGERY — IRRIGATION AND DEBRIDEMENT WOUND
Anesthesia: General | Site: Hip | Laterality: Right

## 2022-05-26 MED ORDER — SUCCINYLCHOLINE CHLORIDE 200 MG/10ML IV SOSY
PREFILLED_SYRINGE | INTRAVENOUS | Status: AC
  Filled 2022-05-26: qty 10

## 2022-05-26 MED ORDER — CEFAZOLIN SODIUM-DEXTROSE 2-4 GM/100ML-% IV SOLN
2.0000 g | INTRAVENOUS | Status: AC
  Administered 2022-05-26: 2 g via INTRAVENOUS

## 2022-05-26 MED ORDER — METOCLOPRAMIDE HCL 10 MG PO TABS: 5.0000 mg | ORAL_TABLET | Freq: Three times a day (TID) | ORAL | Status: DC | PRN

## 2022-05-26 MED ORDER — SUCCINYLCHOLINE CHLORIDE 200 MG/10ML IV SOSY
PREFILLED_SYRINGE | INTRAVENOUS | Status: DC | PRN
  Administered 2022-05-26: 100 mg via INTRAVENOUS

## 2022-05-26 MED ORDER — NEOMYCIN-POLYMYXIN B GU 40-200000 IR SOLN
Status: AC
  Filled 2022-05-26: qty 20

## 2022-05-26 MED ORDER — OXYCODONE HCL 5 MG PO TABS: 5.0000 mg | ORAL_TABLET | ORAL | Status: DC | PRN

## 2022-05-26 MED ORDER — LIDOCAINE HCL (CARDIAC) PF 100 MG/5ML IV SOSY
PREFILLED_SYRINGE | INTRAVENOUS | Status: DC | PRN
  Administered 2022-05-26: 100 mg via INTRAVENOUS

## 2022-05-26 MED ORDER — SODIUM CHLORIDE 0.9 % IR SOLN
Status: DC | PRN
  Administered 2022-05-26: 3012 mL

## 2022-05-26 MED ORDER — ONDANSETRON HCL 4 MG/2ML IJ SOLN
INTRAMUSCULAR | Status: DC | PRN
  Administered 2022-05-26: 4 mg via INTRAVENOUS

## 2022-05-26 MED ORDER — HYDROMORPHONE HCL 1 MG/ML IJ SOLN
INTRAMUSCULAR | Status: DC | PRN
  Administered 2022-05-26: 1 mg via INTRAVENOUS

## 2022-05-26 MED ORDER — CEFAZOLIN SODIUM-DEXTROSE 2-4 GM/100ML-% IV SOLN
INTRAVENOUS | Status: AC
  Filled 2022-05-26: qty 100

## 2022-05-26 MED ORDER — LIDOCAINE HCL (PF) 2 % IJ SOLN
INTRAMUSCULAR | Status: AC
  Filled 2022-05-26: qty 5

## 2022-05-26 MED ORDER — HYDROMORPHONE HCL 1 MG/ML IJ SOLN: 0.5000 mg | INTRAMUSCULAR | Status: DC | PRN

## 2022-05-26 MED ORDER — FENTANYL CITRATE (PF) 100 MCG/2ML IJ SOLN
INTRAMUSCULAR | Status: DC | PRN
  Administered 2022-05-26 (×2): 50 ug via INTRAVENOUS

## 2022-05-26 MED ORDER — ONDANSETRON HCL 4 MG/2ML IJ SOLN: 4.0000 mg | Freq: Four times a day (QID) | INTRAMUSCULAR | Status: DC | PRN

## 2022-05-26 MED ORDER — PROPOFOL 10 MG/ML IV BOLUS
INTRAVENOUS | Status: DC | PRN
  Administered 2022-05-26: 160 mg via INTRAVENOUS

## 2022-05-26 MED ORDER — ONDANSETRON HCL 4 MG PO TABS: 4.0000 mg | ORAL_TABLET | Freq: Four times a day (QID) | ORAL | Status: DC | PRN

## 2022-05-26 MED ORDER — METOCLOPRAMIDE HCL 5 MG/ML IJ SOLN: 5.0000 mg | Freq: Three times a day (TID) | INTRAMUSCULAR | Status: DC | PRN

## 2022-05-26 MED ORDER — MIDAZOLAM HCL 2 MG/2ML IJ SOLN
INTRAMUSCULAR | Status: DC | PRN
  Administered 2022-05-26: 2 mg via INTRAVENOUS

## 2022-05-26 MED ORDER — BUPIVACAINE-EPINEPHRINE (PF) 0.5% -1:200000 IJ SOLN
INTRAMUSCULAR | Status: AC
  Filled 2022-05-26: qty 30

## 2022-05-26 MED ORDER — KETOROLAC TROMETHAMINE 15 MG/ML IJ SOLN
INTRAMUSCULAR | Status: AC
  Filled 2022-05-26: qty 1

## 2022-05-26 MED ORDER — 0.9 % SODIUM CHLORIDE (POUR BTL) OPTIME
TOPICAL | Status: DC | PRN
  Administered 2022-05-26: 500 mL

## 2022-05-26 MED ORDER — MIDAZOLAM HCL 2 MG/2ML IJ SOLN
INTRAMUSCULAR | Status: AC
  Filled 2022-05-26: qty 2

## 2022-05-26 MED ORDER — HYDROMORPHONE HCL 1 MG/ML IJ SOLN
INTRAMUSCULAR | Status: AC
  Filled 2022-05-26: qty 1

## 2022-05-26 MED ORDER — PROPOFOL 10 MG/ML IV BOLUS
INTRAVENOUS | Status: AC
  Filled 2022-05-26: qty 20

## 2022-05-26 MED ORDER — OXYCODONE HCL 5 MG PO TABS: 5.0000 mg | ORAL_TABLET | Freq: Once | ORAL | Status: DC | PRN

## 2022-05-26 MED ORDER — LACTATED RINGERS IV SOLN: INTRAVENOUS | Status: DC

## 2022-05-26 MED ORDER — CEPHALEXIN 500 MG PO CAPS: 500.0000 mg | ORAL_CAPSULE | Freq: Four times a day (QID) | ORAL | 0 refills | Status: AC

## 2022-05-26 MED ORDER — ONDANSETRON HCL 4 MG/2ML IJ SOLN
INTRAMUSCULAR | Status: AC
  Filled 2022-05-26: qty 2

## 2022-05-26 MED ORDER — FENTANYL CITRATE (PF) 100 MCG/2ML IJ SOLN: 25.0000 ug | INTRAMUSCULAR | Status: DC | PRN

## 2022-05-26 MED ORDER — FENTANYL CITRATE (PF) 100 MCG/2ML IJ SOLN
INTRAMUSCULAR | Status: AC
  Filled 2022-05-26: qty 2

## 2022-05-26 MED ORDER — KETOROLAC TROMETHAMINE 15 MG/ML IJ SOLN
7.5000 mg | Freq: Four times a day (QID) | INTRAMUSCULAR | Status: DC
  Administered 2022-05-26: 7.5 mg via INTRAVENOUS

## 2022-05-26 MED ORDER — OXYCODONE HCL 5 MG/5ML PO SOLN: 5.0000 mg | Freq: Once | ORAL | Status: DC | PRN

## 2022-05-26 MED ORDER — CEFAZOLIN SODIUM-DEXTROSE 2-4 GM/100ML-% IV SOLN
2.0000 g | Freq: Once | INTRAVENOUS | Status: AC
  Administered 2022-05-26: 2 g via INTRAVENOUS

## 2022-05-26 MED ORDER — HYDROMORPHONE HCL 2 MG PO TABS: 2.0000 mg | ORAL_TABLET | ORAL | Status: DC | PRN

## 2022-05-26 SURGICAL SUPPLY — 35 items
BNDG COHESIVE 4X5 TAN STRL LF (GAUZE/BANDAGES/DRESSINGS) ×1 IMPLANT
BNDG GAUZE DERMACEA FLUFF 4 (GAUZE/BANDAGES/DRESSINGS) ×1 IMPLANT
BRUSH SCRUB EZ  4% CHG (MISCELLANEOUS) ×2
BRUSH SCRUB EZ 4% CHG (MISCELLANEOUS) ×2 IMPLANT
CHLORAPREP W/TINT 26 (MISCELLANEOUS) ×2 IMPLANT
DRAPE 3/4 80X56 (DRAPES) IMPLANT
DRAPE INCISE IOBAN 66X60 STRL (DRAPES) ×1 IMPLANT
DRAPE SURG 17X11 SM STRL (DRAPES) IMPLANT
DRAPE U-SHAPE 47X51 STRL (DRAPES) IMPLANT
DRSG VAC ATS LRG SENSATRAC (GAUZE/BANDAGES/DRESSINGS) IMPLANT
ELECT REM PT RETURN 9FT ADLT (ELECTROSURGICAL) ×1
ELECTRODE REM PT RTRN 9FT ADLT (ELECTROSURGICAL) ×1 IMPLANT
GLOVE BIOGEL PI IND STRL 8.5 (GLOVE) ×1 IMPLANT
GLOVE SURG ORTHO 8.5 STRL (GLOVE) ×1 IMPLANT
GOWN STRL REUS W/ TWL LRG LVL3 (GOWN DISPOSABLE) ×1 IMPLANT
GOWN STRL REUS W/ TWL XL LVL3 (GOWN DISPOSABLE) ×1 IMPLANT
GOWN STRL REUS W/TWL LRG LVL3 (GOWN DISPOSABLE) ×1
GOWN STRL REUS W/TWL XL LVL3 (GOWN DISPOSABLE) ×1
KIT TURNOVER KIT A (KITS) ×1 IMPLANT
MANIFOLD NEPTUNE II (INSTRUMENTS) ×1 IMPLANT
NS IRRIG 500ML POUR BTL (IV SOLUTION) IMPLANT
PACK EXTREMITY ARMC (MISCELLANEOUS) ×1 IMPLANT
STAPLER SKIN PROX 35W (STAPLE) ×1 IMPLANT
STOCKINETTE IMPERVIOUS 9X36 MD (GAUZE/BANDAGES/DRESSINGS) ×1 IMPLANT
SUT ETHILON 2 0 FSLX (SUTURE) IMPLANT
SUT ETHILON NAB PS2 4-0 18IN (SUTURE) IMPLANT
SUT PDS AB 2-0 CT1 27 (SUTURE) IMPLANT
SUT VIC AB 1 CT1 36 (SUTURE) IMPLANT
SUT VIC AB 2-0 CT1 27 (SUTURE)
SUT VIC AB 2-0 CT1 TAPERPNT 27 (SUTURE) IMPLANT
SUT VICRYL AB 3-0 FS1 BRD 27IN (SUTURE) IMPLANT
SWAB CULTURE AMIES ANAERIB BLU (MISCELLANEOUS) IMPLANT
TOWEL OR 17X26 4PK STRL BLUE (TOWEL DISPOSABLE) ×1 IMPLANT
TRAP FLUID SMOKE EVACUATOR (MISCELLANEOUS) ×1 IMPLANT
WATER STERILE IRR 500ML POUR (IV SOLUTION) ×1 IMPLANT

## 2022-05-26 NOTE — Anesthesia Procedure Notes (Signed)
Procedure Name: Intubation Date/Time: 05/26/2022 3:09 PM  Performed by: Cammie Sickle, CRNAPre-anesthesia Checklist: Patient identified, Patient being monitored, Timeout performed, Emergency Drugs available and Suction available Patient Re-evaluated:Patient Re-evaluated prior to induction Oxygen Delivery Method: Circle system utilized Preoxygenation: Pre-oxygenation with 100% oxygen Induction Type: IV induction Ventilation: Mask ventilation without difficulty Laryngoscope Size: 3 and McGraph Grade View: Grade I Tube type: Oral Tube size: 7.5 mm Number of attempts: 1 Airway Equipment and Method: Stylet Placement Confirmation: ETT inserted through vocal cords under direct vision, positive ETCO2 and breath sounds checked- equal and bilateral Secured at: 23 cm Tube secured with: Tape Dental Injury: Teeth and Oropharynx as per pre-operative assessment
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
## 2022-05-26 NOTE — Transfer of Care (Signed)
Immediate Anesthesia Transfer of Care Note  Patient: Jack Blanchard  Procedure(s) Performed: IRRIGATION AND DEBRIDEMENT WOUND RIGHT HIP (Right: Hip) APPLICATION OF WOUND VAC (Right: Hip)  Patient Location: PACU  Anesthesia Type:General  Level of Consciousness: drowsy  Airway & Oxygen Therapy: Patient Spontanous Breathing and Patient connected to face mask oxygen  Post-op Assessment: Report given to RN and Post -op Vital signs reviewed and stable  Post vital signs: Reviewed and stable  Last Vitals:  Vitals Value Taken Time  BP 158/91 05/26/22 1608  Temp 36.1 1608  Pulse 62 05/26/22 1612  Resp 18 05/26/22 1613  SpO2 100 % 05/26/22 1613  Vitals shown include unvalidated device data.  Last Pain:  Vitals:   05/26/22 1232  TempSrc: Tympanic         Complications: No notable events documented.

## 2022-05-26 NOTE — Op Note (Addendum)
05/26/2022  4:34 PM  PATIENT:  Jack Blanchard    PRE-OPERATIVE DIAGNOSIS:  Right hip wound dehiscence  POST-OPERATIVE DIAGNOSIS:  Same  PROCEDURE:  IRRIGATION AND DEBRIDEMENT WOUND RIGHT HIP, APPLICATION OF WOUND VAC  SURGEON:  Lovell Sheehan, MD  ASSIST: Carlynn Spry, PA-C  ANESTHESIA:   General  EBL: less than 25 mL   CULTURES overlying the fascia sent for C&S  PREOPERATIVE INDICATIONS:  Jack Blanchard is a  72 y.o. male who failed conservative measures and elected for surgical management of the deep infection.   The risks benefits and alternatives were discussed with the patient preoperatively including but not limited to the risks of infection, bleeding, nerve injury, incomplete relief of symptoms, pillar pain, cardiopulmonary complications, the need for revision surgery, among others, and the patient was willing to proceed.  OPERATIVE FINDINGS: Wound dehiscence proximal half, intact deep fascia, serosanguinous drainage. The tissue had minimal granulation and evidence of fatty necrosis. The wound size was 20 cm by 7 cm and 6 cm of depth.  OPERATIVE PROCEDURE:   After informed consent was obtained and the appropriate extremity marked in the pre-operative holding area, the patient was taken to the operating room and placed in the supine position. General anesthesia was induced and the hip was prepped and draped in standard sterile fashion. The prior incision was opened proximally. Sharp dissection with the scalpel and iris scissor was utilized to debride the skin edges. Necrotic tissue was sharply debrided with the iris scissors. Care was taken to protect the tendinous and nervous structures. Cultures were obtained. The wound was irrigated with more than 3 liters of bacitracin laced normal saline. A wound vac 14 cm by 6 cm was placed into the wound with excellent seal.   Jack Bushman, MD

## 2022-05-26 NOTE — H&P (Addendum)
The patient has been re-examined, and the chart reviewed, and there have been no interval changes to the documented history and physical.  Plan a right hip irrigation, debridement and wound VAC placement today.  Anesthesia is not consulted regarding a peripheral nerve block for post-operative pain.  The risks, benefits, and alternatives have been discussed at length, and the patient is willing to proceed.    Patient has a positive drug screen today, however given the condition of his wound and potential for devastating consequences of deep infection, that include loss of limb and death, this procedure is deemed emergent.

## 2022-05-26 NOTE — Discharge Instructions (Addendum)
Follow-up on Friday at [facility] with Carlynn Spry, PA-C [phone]  Call with any questions or concerns regarding VAC treatment   AMBULATORY SURGERY  DISCHARGE INSTRUCTIONS   The drugs that you were given will stay in your system until tomorrow so for the next 24 hours you should not:  Drive an automobile Make any legal decisions Drink any alcoholic beverage   You may resume regular meals tomorrow.  Today it is better to start with liquids and gradually work up to solid foods.  You may eat anything you prefer, but it is better to start with liquids, then soup and crackers, and gradually work up to solid foods.   Please notify your doctor immediately if you have any unusual bleeding, trouble breathing, redness and pain at the surgery site, drainage, fever, or pain not relieved by medication.      Additional Instructions:

## 2022-05-27 ENCOUNTER — Encounter: Payer: Self-pay | Admitting: Orthopedic Surgery

## 2022-05-27 ENCOUNTER — Other Ambulatory Visit: Payer: Self-pay

## 2022-05-28 NOTE — Anesthesia Postprocedure Evaluation (Signed)
Anesthesia Post Note  Patient: Jack Blanchard  Procedure(s) Performed: IRRIGATION AND DEBRIDEMENT WOUND RIGHT HIP (Right: Hip) APPLICATION OF WOUND VAC (Right: Hip)  Patient location during evaluation: PACU Anesthesia Type: General Level of consciousness: awake and alert Pain management: pain level controlled Vital Signs Assessment: post-procedure vital signs reviewed and stable Respiratory status: spontaneous breathing, nonlabored ventilation, respiratory function stable and patient connected to nasal cannula oxygen Cardiovascular status: blood pressure returned to baseline and stable Postop Assessment: no apparent nausea or vomiting Anesthetic complications: no   No notable events documented.   Last Vitals:  Vitals:   05/26/22 1645 05/26/22 1706  BP: (!) 179/100 (!) 159/92  Pulse: 60 (!) 58  Resp:  19  Temp: (!) 36.1 C (!) 36.2 C  SpO2: 97% 95%    Last Pain:  Vitals:   05/26/22 1706  TempSrc: Temporal  PainSc: 0-No pain                 Martha Clan

## 2022-05-30 LAB — AEROBIC/ANAEROBIC CULTURE W GRAM STAIN (SURGICAL/DEEP WOUND): Gram Stain: NONE SEEN
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
## 2022-06-06 ENCOUNTER — Emergency Department
Admission: EM | Admit: 2022-06-06 | Discharge: 2022-06-06 | Disposition: A | Discharge status: Home / Self Care | Payer: 59 | Attending: Student in an Organized Health Care Education/Training Program | Admitting: Student in an Organized Health Care Education/Training Program

## 2022-06-06 ENCOUNTER — Encounter: Payer: Self-pay | Admitting: Emergency Medicine

## 2022-06-06 ENCOUNTER — Other Ambulatory Visit: Payer: Self-pay

## 2022-06-06 DIAGNOSIS — Z5189 Encounter for other specified aftercare: Secondary | ICD-10-CM

## 2022-06-06 DIAGNOSIS — Z4801 Encounter for change or removal of surgical wound dressing: Secondary | ICD-10-CM | POA: Insufficient documentation

## 2022-06-06 NOTE — Discharge Instructions (Signed)
Follow up with [facility]

## 2022-06-06 NOTE — ED Provider Notes (Signed)
[facility] Provider Note    Event Date/Time   First MD Initiated Contact with Patient 06/06/22 1122     (approximate)   History   Wound Check   HPI  Jack Blanchard is a 72 y.o. male status post recent right hip replacement with complication of wound infection with wound VAC in place presents to the ER as the wound VAC became detached last night.  He denies any new pain.  Was unable to get it to read here.  Denies any fevers or chills.     Physical Exam   Triage Vital Signs: ED Triage Vitals  Enc Vitals Group     BP 06/06/22 1101 (!) 145/64     Pulse Rate 06/06/22 1101 (!) 53     Resp 06/06/22 1101 20     Temp 06/06/22 1101 97.7 F (36.5 C)     Temp Source 06/06/22 1101 Oral     SpO2 06/06/22 1101 100 %     Weight 06/06/22 1100 262 lb (118.8 kg)     Height 06/06/22 1100 '5\' 11"'$  (1.803 m)     Head Circumference --      Peak Flow --      Pain Score 06/06/22 1100 0     Pain Loc --      Pain Edu? --      Excl. in [place]? --     Most recent vital signs: Vitals:   06/06/22 1101  BP: (!) 145/64  Pulse: (!) 53  Resp: 20  Temp: 97.7 F (36.5 C)  SpO2: 100%     Constitutional: Alert  Eyes: Conjunctivae are normal.  Head: Atraumatic. Nose: No congestion/rhinnorhea. Mouth/Throat: Mucous membranes are moist.   Neck: Painless ROM.  Cardiovascular:   Good peripheral circulation. Respiratory: Normal respiratory effort.  No retractions.  Gastrointestinal: Soft and nontender.  Musculoskeletal:  no deformity.  Right wound without any evidence of purulence no bleeding.  No surrounding erythema. Neurologic:  MAE spontaneously. No gross focal neurologic deficits are appreciated.  Skin:  Skin is warm, dry and intact. No rash noted. Psychiatric: Mood and affect are normal. Speech and behavior are normal.    ED Results / Procedures / Treatments   Labs (all labs ordered are listed, but only abnormal results are displayed) Labs Reviewed - No data to  display   EKG     RADIOLOGY    PROCEDURES:  Critical Care performed:   Procedures   MEDICATIONS ORDERED IN ED: Medications - No data to display   IMPRESSION / MDM / [place] / ED COURSE  I reviewed the triage vital signs and the nursing notes.                              Differential diagnosis includes, but is not limited to, wound dehiscence, infection, wound VAC complication.   Patient presented to the ER for evaluation of wound VAC complication.  Wound itself appears appropriately healing.  Wound VAC dressing replaced.  Observed to ensure that there was good seal.  Patient tolerated well.  Does not require any further diagnostic testing or imaging.  Appropriate for outpatient follow-up.   FINAL CLINICAL IMPRESSION(S) / ED DIAGNOSES   Final diagnoses:  Visit for wound check     Rx / DC Orders   ED Discharge Orders     None        Note:  This document was prepared using  [employer] forces training and education officer and may include unintentional dictation errors.    Merlyn Lot, MD 06/06/22 [phone]

## 2022-06-06 NOTE — ED Triage Notes (Addendum)
Pt via POV from home. Pt here for wound check. States he woke up this AM and his wound vac was disconnected. States he need the wound vac replaced. Wound to the R hip. Pt has had the wound vac for 2 weeks. Pt is A&Ox4 and NAD

## 2022-06-12 ENCOUNTER — Encounter: Payer: 59 | Attending: Physician Assistant | Admitting: Physician Assistant

## 2022-06-12 DIAGNOSIS — Z96641 Presence of right artificial hip joint: Secondary | ICD-10-CM | POA: Diagnosis not present

## 2022-06-12 DIAGNOSIS — L98492 Non-pressure chronic ulcer of skin of other sites with fat layer exposed: Secondary | ICD-10-CM | POA: Diagnosis not present

## 2022-06-12 DIAGNOSIS — I10 Essential (primary) hypertension: Secondary | ICD-10-CM | POA: Insufficient documentation

## 2022-06-12 DIAGNOSIS — T8131XA Disruption of external operation (surgical) wound, not elsewhere classified, initial encounter: Secondary | ICD-10-CM | POA: Diagnosis present

## 2022-06-12 NOTE — Progress Notes (Signed)
TREVAR, BOEHRINGER ([id number]) [id number] Page 1 of 5 Visit Report for 06/12/2022 Abuse Risk Screen Details Patient Name: Date of Service: Jack Blanchard, Jack Blanchard 06/12/2022 10:00 A M Medical Record Number: [id number] Patient Account Number: [id number] Date of Birth/Sex: Treating RN: [birth year] (72 y.o. [name] Primary Care Tajah Schreiner: Royetta Crochet Other Clinician: Referring Yeng Frankie: Treating Jermany Rimel/Extender: Toy Care in Treatment: 0 Abuse Risk Screen Items Answer ABUSE RISK SCREEN: Has anyone close to you tried to hurt or harm you recentlyo No Do you feel uncomfortable with anyone in your familyo No Has anyone forced you do things that you didnt want to doo No Electronic Signature(s) Signed: 06/12/2022 12:04:39 PM By: Gretta Cool, BSN, RN, CWS, Kim RN, BSN Entered By: Gretta Cool, BSN, RN, CWS, Kim on 06/12/2022 10:31:19 -------------------------------------------------------------------------------- Activities of Daily Living Details Patient Name: Date of Service: Jack Blanchard, [place] 06/12/2022 10:00 [place] Record Number: [id number] Patient Account Number: [id number] Date of Birth/Sex: Treating RN: [birth year] (72 y.o. [name] Primary Care Chad Donoghue: Royetta Crochet Other Clinician: Referring Madellyn Denio: Treating Yecheskel Kurek/Extender: Toy Care in Treatment: 0 Activities of Daily Living Items Answer Activities of Daily Living (Please select one for each item) Drive Automobile Completely Able T Medications ake Completely Able Use T elephone Completely Able Care for Appearance Completely Able Use T oilet Completely Able Bath / Shower Completely Able Dress Self Completely Able Feed Self Completely Able Walk Completely Able Get In / Out Bed Completely Able Housework Completely SKYLAN, LARA ([id number]) [phone] [id number] Page 2 of 5 Prepare Meals Completely Able Handle Money Completely  Able Shop for Self Completely Able Electronic Signature(s) Signed: 06/12/2022 12:04:39 PM By: Gretta Cool, BSN, RN, CWS, Kim RN, BSN Entered By: Gretta Cool, BSN, RN, CWS, Kim on 06/12/2022 10:31:30 -------------------------------------------------------------------------------- Education Screening Details Patient Name: Date of Service: [place], [place] 06/12/2022 10:00 [place] Record Number: [id number] Patient Account Number: [id number] Date of Birth/Sex: Treating RN: 1951-02-07 (72 y.o. [name] Primary Care Jack Blanchard: Royetta Crochet Other Clinician: Referring Kyal Arts: Treating Yaron Grasse/Extender: Toy Care in Treatment: 0 Primary Learner Assessed: Patient Learning Preferences/Education Level/Primary Language Learning Preference: Explanation, Demonstration Highest Education Level: High School Preferred Language: English Cognitive Barrier Language Barrier: No Translator Needed: No Memory Deficit: No Emotional Barrier: No Physical Barrier Impaired Vision: No Impaired Hearing: No Decreased Hand dexterity: No Knowledge/Comprehension Knowledge Level: High Comprehension Level: High Ability to understand written instructions: High Ability to understand verbal instructions: High Motivation Anxiety Level: Calm Cooperation: Cooperative Education Importance: Acknowledges Need Interest in Health Problems: Asks Questions Perception: Coherent Willingness to Engage in Self-Management High Activities: Readiness to Engage in Self-Management High Activities: Electronic Signature(s) Signed: 06/12/2022 12:04:39 PM By: Gretta Cool, BSN, RN, CWS, Kim RN, BSN Entered By: Gretta Cool, BSN, RN, CWS, Kim on 06/12/2022 10:31:50 [name] ([id number]) [id number] Page 3 of 5 -------------------------------------------------------------------------------- Fall Risk Assessment Details Patient Name: Date of Service: Jack Blanchard, Jack Blanchard 06/12/2022 10:00 A M Medical  Record Number: [id number] Patient Account Number: [id number] Date of Birth/Sex: Treating RN: 23-Feb-1951 (72 y.o. Isac Sarna, Maudie Mercury Primary Care Kadeidra Coryell: Royetta Crochet Other Clinician: Referring Devion Chriscoe: Treating Mcgregor Tinnon/Extender: Toy Care in Treatment: 0 Fall Risk Assessment Items Have you had 2 or more falls in the last 12 monthso 0 Yes Have you had any fall that resulted in injury in the last 12 monthso 0 No FALLS RISK SCREEN History of falling - immediate or within 3 months 25 Yes Secondary diagnosis (Do you  have 2 or more medical diagnoseso) 0 No Ambulatory aid None/bed rest/wheelchair/nurse 0 No Crutches/cane/walker 15 Yes Furniture 0 No Intravenous therapy Access/Saline/Heparin Lock 0 No Gait/Transferring Normal/ bed rest/ wheelchair 0 No Weak (short steps with or without shuffle, stooped but able to lift head while walking, may seek 10 Yes support from furniture) Impaired (short steps with shuffle, may have difficulty arising from chair, head down, impaired 0 No balance) Mental Status Oriented to own ability 0 Yes Electronic Signature(s) Signed: 06/12/2022 12:04:39 PM By: Gretta Cool, BSN, RN, CWS, Kim RN, BSN Entered By: Gretta Cool, BSN, RN, CWS, Kim on 06/12/2022 10:32:25 -------------------------------------------------------------------------------- Foot Assessment Details Patient Name: Date of Service: Jack, Blanchard 06/12/2022 10:00 [place] Record Number: [id number] Patient Account Number: [id number] Date of Birth/Sex: Treating RN: 05-10-1951 (72 y.o. [name] Primary Care Leiam Hopwood: Royetta Crochet Other Clinician: Referring Akire Rennert: Treating Haeli Gerlich/Extender: Toy Care in Treatment: 0 Foot Assessment Items Site Locations [place], [place] ([id number]) [phone] [id number] Page 4 of 5 + = Sensation present, - = Sensation absent, C = Callus, U = Ulcer R = Redness, W = Warmth, M = Maceration, PU =  Pre-ulcerative lesion F = Fissure, S = Swelling, D = Dryness Assessment Right: Left: Other Deformity: No No Prior Foot Ulcer: No No Prior Amputation: No No Charcot Joint: No No Ambulatory Status: Ambulatory With Help Assistance Device: Cane GaitEnergy manager) Signed: 06/12/2022 12:04:39 PM By: Gretta Cool, BSN, RN, CWS, Kim RN, BSN Entered By: Gretta Cool, BSN, RN, CWS, Kim on 06/12/2022 10:33:26 -------------------------------------------------------------------------------- Nutrition Risk Screening Details Patient Name: Date of Service: [place], [place] 06/12/2022 10:00 [place] Record Number: [id number] Patient Account Number: [id number] Date of Birth/Sex: Treating RN: 12/18/1950 (72 y.o. Isac Sarna, Maudie Mercury Primary Care Anayeli Arel: Royetta Crochet Other Clinician: Referring Mayela Bullard: Treating Katasha Riga/Extender: Toy Care in Treatment: 0 Height (in): 71 Weight (lbs): 263 Body Mass Index (BMI): 36.7 Nutrition Risk Screening Items Score Screening NUTRITION RISK SCREEN: I have an illness or condition that made me change the kind and/or amount of food I eat 0 No I eat fewer than two meals per day 0 No I eat few fruits and vegetables, or milk products 0 No I have three or more drinks of beer, liquor or wine almost every day 2 Yes I have tooth or mouth problems that make it hard for me to eat 0 No BLANSETT, [name] ([id number]) [id number] Page 5 of 5 I don't always have enough money to buy the food I need 0 No I eat alone most of the time 1 Yes I take three or more different prescribed or over-the-counter drugs a day 1 Yes Without wanting to, I have lost or gained 10 pounds in the last six months 0 No I am not always physically able to shop, cook and/or feed myself 0 No Nutrition Protocols Good Risk Protocol Moderate Risk Protocol 0 Provide education on nutrition High Risk Proctocol Risk Level: Moderate Risk Score: 4 Electronic  Signature(s) Signed: 06/12/2022 12:04:39 PM By: Gretta Cool, BSN, RN, CWS, Kim RN, BSN Entered By: Gretta Cool, BSN, RN, CWS, Kim on 06/12/2022 10:33:11

## 2022-06-12 NOTE — Progress Notes (Signed)
MONTARIUS, KITAGAWA ([id number]) [id number] Page 1 of 9 Visit Report for 06/12/2022 Allergy List Details Patient Name: Date of Service: [place], [name] 06/12/2022 10:00 [place] Record Number: [id number] Patient Account Number: [id number] Date of Birth/Sex: Treating RN: 03/31/1951 (72 y.o. [name] Primary Care Rigdon Macomber: Royetta Crochet Other Clinician: Referring Cloria Ciresi: Treating Lindon Kiel/Extender: Toy Care in Treatment: 0 Allergies Active Allergies doxycycline hyclate vancomycin acetaminophen Allergy Notes Electronic Signature(s) Signed: 06/12/2022 12:04:39 PM By: Gretta Cool, BSN, RN, CWS, Kim RN, BSN Entered By: Gretta Cool, BSN, RN, CWS, Kim on 06/12/2022 10:26:59 -------------------------------------------------------------------------------- Arrival Information Details Patient Name: Date of Service: [place], [place] 06/12/2022 10:00 A M Medical Record Number: [id number] Patient Account Number: [id number] Date of Birth/Sex: Treating RN: 1951-03-23 (72 y.o. [name] Primary Care Kambry Takacs: Royetta Crochet Other Clinician: Referring Anela Bensman: Treating Alfreddie Consalvo/Extender: Toy Care in Treatment: 0 Visit Information Patient Arrived: Ambulatory Arrival Time: 10:21 Transfer Assistance: None Patient Identification Verified: Yes Secondary Verification Process Completed: Yes Patient Has Alerts: Yes Patient Alerts: Patient on Blood Thinner '81mg'$  aspririn MARGUIS, MATHIESON ([id number]) [id number] Page 2 of 9 Electronic Signature(s) Signed: 06/12/2022 12:04:39 PM By: Gretta Cool, BSN, RN, CWS, Kim RN, BSN Entered By: Gretta Cool, BSN, RN, CWS, Kim on 06/12/2022 10:22:48 -------------------------------------------------------------------------------- Clinic Level of Care Assessment Details Patient Name: Date of Service: [place], [name] 06/12/2022 10:00 [place] Record Number: [id number] Patient Account Number:  [id number] Date of Birth/Sex: Treating RN: 02-21-1951 (72 y.o. [name] Primary Care Saniah Schroeter: Royetta Crochet Other Clinician: Referring Vernadette Stutsman: Treating Sally Menard/Extender: Toy Care in Treatment: 0 Clinic Level of Care Assessment Items TOOL 1 Quantity Score '[]'$  - 0 Use when EandM and Procedure is performed on INITIAL visit ASSESSMENTS - Nursing Assessment / Reassessment X- 1 20 General Physical Exam (combine w/ comprehensive assessment (listed just below) when performed on new pt. evals) X- 1 25 Comprehensive Assessment (HX, ROS, Risk Assessments, Wounds Hx, etc.) ASSESSMENTS - Wound and Skin Assessment / Reassessment '[]'$  - 0 Dermatologic / Skin Assessment (not related to wound area) ASSESSMENTS - Ostomy and/or Continence Assessment and Care '[]'$  - 0 Incontinence Assessment and Management '[]'$  - 0 Ostomy Care Assessment and Management (repouching, etc.) PROCESS - Coordination of Care X - Simple Patient / Family Education for ongoing care 1 15 '[]'$  - 0 Complex (extensive) Patient / Family Education for ongoing care X- 1 10 Staff obtains Programmer, systems, Records, T Results / Process Orders est '[]'$  - 0 Staff telephones HHA, Nursing Homes / Clarify orders / etc '[]'$  - 0 Routine Transfer to another Facility (non-emergent condition) '[]'$  - 0 Routine Hospital Admission (non-emergent condition) X- 1 15 New Admissions / Biomedical engineer / Ordering NPWT Apligraf, etc. , '[]'$  - 0 Emergency Hospital Admission (emergent condition) PROCESS - Special Needs '[]'$  - 0 Pediatric / Minor Patient Management '[]'$  - 0 Isolation Patient Management '[]'$  - 0 Hearing / Language / Visual special needs '[]'$  - 0 Assessment of Community assistance (transportation, D/C planning, etc.) '[]'$  - 0 Additional assistance / Altered mentation '[]'$  - 0 Support Surface(s) Assessment (bed, cushion, seat, etc.) INTERVENTIONS - Miscellaneous '[]'$  - 0 External ear exam '[]'$  - 0 Patient Transfer (multiple  staff / [employer] / Similar devices) [place], [name] ([id number]) [id number] Page 3 of 9 '[]'$  - 0 Simple Staple / Suture removal (25 or less) '[]'$  - 0 Complex Staple / Suture removal (26 or more) '[]'$  - 0 Hypo/Hyperglycemic Management (do not check if billed separately) '[]'$  -  0 Ankle / Brachial Index (ABI) - do not check if billed separately Has the patient been seen at the hospital within the last three years: Yes Total Score: 85 Level Of Care: New/Established - Level 3 Electronic Signature(s) Signed: 06/12/2022 12:04:39 PM By: Gretta Cool, BSN, RN, CWS, Kim RN, BSN Entered By: Gretta Cool, BSN, RN, CWS, Kim on 06/12/2022 11:36:37 -------------------------------------------------------------------------------- Encounter Discharge Information Details Patient Name: Date of Service: [place], [place] 06/12/2022 10:00 [place] Record Number: [id number] Patient Account Number: [id number] Date of Birth/Sex: Treating RN: Oct 21, 1950 (72 y.o. [name] Primary Care Charnette Younkin: Royetta Crochet Other Clinician: Referring Eugena Rhue: Treating Crewe Heathman/Extender: Toy Care in Treatment: 0 Encounter Discharge Information Items Post Procedure Vitals Discharge Condition: Stable Temperature (F): 97.9 Ambulatory Status: Cane Pulse (bpm): 60 Discharge Destination: Home Respiratory Rate (breaths/min): 16 Transportation: Private Auto Blood Pressure (mmHg): 147/78 Schedule Follow-up Appointment: Yes Clinical Summary of Care: Electronic Signature(s) Signed: 06/12/2022 11:49:43 AM By: Gretta Cool, BSN, RN, CWS, Kim RN, BSN Entered By: Gretta Cool, BSN, RN, CWS, Kim on 06/12/2022 11:49:43 -------------------------------------------------------------------------------- Lower Extremity Assessment Details Patient Name: Date of Service: [place], [place] 06/12/2022 10:00 [place] Record Number: [id number] Patient Account Number: [id number] Date of Birth/Sex: Treating RN: 1951-03-08 (72 y.o. [name] Primary Care Mylez Venable: Royetta Crochet Other Clinician: Referring Clerance Umland: Treating Joshwa Hemric/Extender: Toy Care in Treatment: 0 Electronic Signature(s) Signed: 06/12/2022 10:46:34 AM By: Gretta Cool BSN, RN, CWS, Kim RN, BSN [place], [name] ([id number]) [phone].pdf Page 4 of 9 Signed: 06/12/2022 10:46:34 AM By: Gretta Cool, BSN, RN, CWS, Kim RN, BSN Entered By: Gretta Cool, BSN, RN, CWS, Kim on 06/12/2022 10:46:34 -------------------------------------------------------------------------------- Multi Wound Chart Details Patient Name: Date of Service: [place], [name] 06/12/2022 10:00 [place] Record Number: [id number] Patient Account Number: [id number] Date of Birth/Sex: Treating RN: 27-Jan-1951 (72 y.o. [name] Primary Care Wrangler Penning: Royetta Crochet Other Clinician: Referring Sasuke Yaffe: Treating Ikenna Ohms/Extender: Toy Care in Treatment: 0 Vital Signs Height(in): 97 Pulse(bpm): 60 Weight(lbs): 678 Blood Pressure(mmHg): 147/78 Body Mass Index(BMI): 36.7 Temperature(F): 97.9 Respiratory Rate(breaths/min): 16 [1:Photos: No Photos Right Trochanter Wound Location: Surgical Injury Wounding Event: Dehisced Wound Primary Etiology: Asthma, Chronic Obstructive Comorbid History: Pulmonary Disease (COPD), Hypertension, Osteoarthritis, Neuropathy 05/22/2022 Date Acquired: 0 Weeks  of Treatment: Open Wound Status: No Wound Recurrence: 10.2x2.8x2.5 Measurements L x W x D (cm) 22.431 A (cm) : rea 56.077 Volume (cm) : 2 Position 1 (o'clock): 3 Maximum Distance 1 (cm): Yes Tunneling: Full Thickness With Exposed Support N/A  Classification: Structures Large Exudate Amount: Sanguinous Exudate Type: red Exudate Color: Flat and Intact Wound Margin: Small (1-33%) Granulation Amount: Red Granulation Quality: Large (67-100%) Necrotic Amount: Fat Layer (Subcutaneous Tissue): Yes  N/A Exposed Structures: Muscle: Yes Fascia: No Tendon: No Joint: No  Bone: No] [N/A:N/A N/A N/A N/A N/A N/A N/A N/A N/A N/A N/A N/A N/A N/A N/A N/A N/A N/A N/A N/A] Treatment Notes Electronic Signature(s) Signed: 06/12/2022 12:04:39 PM By: Gretta Cool, BSN, RN, CWS, Kim RN, BSN Entered By: Gretta Cool, BSN, RN, CWS, Kim on 06/12/2022 11:04:49 [name] ([id number]) [id number] Page 5 of 9 -------------------------------------------------------------------------------- Multi-Disciplinary Care Plan Details Patient Name: Date of Service: [place], [name] 06/12/2022 10:00 A M Medical Record Number: [id number] Patient Account Number: [id number] Date of Birth/Sex: Treating RN: 1951-01-14 (72 y.o. [name] Primary Care Preeti Winegardner: Royetta Crochet Other Clinician: Referring Quinterius Gaida: Treating Kyrstan Gotwalt/Extender: Toy Care in Treatment: 0 Active Inactive Abuse / Safety / Falls / Self  Care Management Nursing Diagnoses: Potential for falls Goals: Patient will not experience any injury related to falls Date Initiated: 06/12/2022 Target Resolution Date: 06/12/2022 Goal Status: Active Patient/caregiver will verbalize understanding of skin care regimen Date Initiated: 06/12/2022 Target Resolution Date: 06/12/2022 Goal Status: Active Interventions: Assess self care needs on admission and as needed Notes: Necrotic Tissue Nursing Diagnoses: Impaired tissue integrity related to necrotic/devitalized tissue Knowledge deficit related to management of necrotic/devitalized tissue Goals: Necrotic/devitalized tissue will be minimized in the wound bed Date Initiated: 06/12/2022 Target Resolution Date: 06/12/2022 Goal Status: Active Patient/caregiver will verbalize understanding of reason and process for debridement of necrotic tissue Date Initiated: 06/12/2022 Target Resolution Date: 06/12/2022 Goal Status: Active Interventions: Assess patient pain level pre-, during and post procedure and prior to discharge Provide education on necrotic  tissue and debridement process Treatment Activities: Apply topical anesthetic as ordered : 06/12/2022 Excisional debridement : 06/12/2022 Notes: Orientation to the Wound Care Program Nursing Diagnoses: Knowledge deficit related to the wound healing center program Goals: Patient/caregiver will verbalize understanding of the [place] Date Initiated: 06/12/2022 Target Resolution Date: 06/12/2022 Goal Status: Active Interventions: MARSHAUN, LORTIE ([id number]) [id number] Page 6 of 9 Provide education on orientation to the wound center Notes: Wound/Skin Impairment Nursing Diagnoses: Impaired tissue integrity Knowledge deficit related to smoking impact on wound healing Knowledge deficit related to ulceration/compromised skin integrity Goals: Patient/caregiver will verbalize understanding of skin care regimen Date Initiated: 06/12/2022 Target Resolution Date: 06/12/2022 Goal Status: Active Ulcer/skin breakdown will have a volume reduction of 30% by week 4 Date Initiated: 06/12/2022 Target Resolution Date: 07/03/2022 Goal Status: Active Interventions: Assess patient/caregiver ability to obtain necessary supplies Assess patient/caregiver ability to perform ulcer/skin care regimen upon admission and as needed Assess ulceration(s) every visit Provide education on smoking Provide education on ulcer and skin care Treatment Activities: Patient referred to home care : 06/12/2022 Notes: Electronic Signature(s) Signed: 06/12/2022 11:46:37 AM By: Gretta Cool, BSN, RN, CWS, Kim RN, BSN Entered By: Gretta Cool, BSN, RN, CWS, Kim on 06/12/2022 11:46:36 -------------------------------------------------------------------------------- Negative Pressure Wound Therapy Application (NPWT) Details Patient Name: Date of Service: [place], [place] 06/12/2022 10:00 [place] Record Number: [id number] Patient Account Number: [id number] Date of Birth/Sex: Treating RN: [birth year] (72 y.o.  [name] Primary Care Velina Drollinger: Royetta Crochet Other Clinician: Referring Marion Rosenberry: Treating Hunter Bachar/Extender: Toy Care in Treatment: 0 NPWT Application Performed for: Wound #1 Right Trochanter Performed By: Cornell Barman, RN Type: Other Coverage Size (sq cm): 28.56 Pressure Type: Constant Pressure Setting: 125 mmHG Drain Type: None Primary Contact: None Quantity of Sponges/Gauze Inserted: 2 Sponge/Dressing Type: Foam, Black Date Initiated: 06/12/2022 Response to Treatment: tolerated well Post Procedure Diagnosis Same as Pre-procedure Electronic Signature(s) Signed: 06/12/2022 11:38:40 AM By: Gretta Cool, BSN, RN, CWS, Kim RN, BSN [place], [name] ([id number]) [id number].pdf Page 7 of 9 Signed: 06/12/2022 11:38:40 AM By: Gretta Cool, BSN, RN, CWS, Kim RN, BSN Entered By: Gretta Cool, BSN, RN, CWS, Kim on 06/12/2022 11:38:39 -------------------------------------------------------------------------------- Pain Assessment Details Patient Name: Date of Service: [place], [name] 06/12/2022 10:00 [place] Record Number: [id number] Patient Account Number: [id number] Date of Birth/Sex: Treating RN: [birth year] (72 y.o. [name] Primary Care Raelea Gosse: Royetta Crochet Other Clinician: Referring Markeesha Char: Treating Ansar Skoda/Extender: Toy Care in Treatment: 0 Active Problems Location of Pain Severity and Description of Pain Patient Has Paino No Site Locations Duration of the Pain. Constant / Intermittento Intermittent Rate the pain. Current Pain Level: 3 Pain Management and Medication Current Pain Management: Electronic Signature(s)  Signed: 06/12/2022 12:04:39 PM By: Gretta Cool, BSN, RN, CWS, Kim RN, BSN Entered By: Gretta Cool, BSN, RN, CWS, Kim on 06/12/2022 10:25:45 -------------------------------------------------------------------------------- Patient/Caregiver Education Details Patient Name: Date of Service: [place], [place]  1/26/2024andnbsp10:00 [place] Record Number: [id number] Patient Account Number: [id number] Date of Birth/Gender: Treating RN: April 09, 1951 (72 y.o. [name] [place], [place] ([id number]) [phone].pdf Page 8 of 9 Primary Care Physician: Royetta Crochet Other Clinician: Referring Physician: Treating Physician/Extender: Toy Care in Treatment: 0 Education Assessment Education Provided To: Patient Education Topics Provided Wound/Skin Impairment: Handouts: Caring for Your Ulcer, Other: Wet-to-dry, if NPWT comes off. Methods: Demonstration, Explain/Verbal Responses: State content correctly Electronic Signature(s) Signed: 06/12/2022 12:04:39 PM By: Gretta Cool, BSN, RN, CWS, Kim RN, BSN Entered By: Gretta Cool, BSN, RN, CWS, Kim on 06/12/2022 11:43:51 -------------------------------------------------------------------------------- Wound Assessment Details Patient Name: Date of Service: [place], [place] 06/12/2022 10:00 [place] Record Number: [id number] Patient Account Number: [id number] Date of Birth/Sex: Treating RN: 1950/06/09 (72 y.o. Isac Sarna, Maudie [place] Primary Care Jazmon Kos: Royetta Crochet Other Clinician: Referring Chanci Ojala: Treating Alexzandrea Normington/Extender: Toy Care in Treatment: 0 Wound Status Wound Number: 1 Primary Dehisced Wound Etiology: Wound Location: Right Upper Leg Wound Open Wounding Event: Surgical Injury Status: Date Acquired: 05/22/2022 Comorbid Asthma, Chronic Obstructive Pulmonary Disease (COPD), Weeks Of Treatment: 0 History: Hypertension, Osteoarthritis, Neuropathy Clustered Wound: No Wound Measurements Length: (cm) 10.2 Width: (cm) 2.8 Depth: (cm) 2.5 Area: (cm) 22.431 Volume: (cm) 56.077 % Reduction in Area: % Reduction in Volume: Tunneling: Yes Position (o'clock): 2 Maximum Distance: (cm) 3 Undermining: No Wound Description Classification: Full Thickness With Exposed Suppo Wound Margin: Flat and  Intact Exudate Amount: Large Exudate Type: Sanguinous Exudate Color: red rt Structures Foul Odor After Cleansing: No Slough/Fibrino Yes Wound Bed Granulation Amount: Medium (34-66%) Exposed Structure Granulation Quality: Red Fascia Exposed: No Necrotic Amount: Medium (34-66%) Fat Layer (Subcutaneous Tissue) Exposed: Yes Necrotic Quality: Adherent Slough Tendon Exposed: No [place], [name] ([id number]) [id number] Page 9 of 9 Muscle Exposed: Yes Necrosis of Muscle: No Joint Exposed: No Bone Exposed: No Treatment Notes Wound #1 (Trochanter) Wound Laterality: Right Cleanser Peri-Wound Care Topical Primary Dressing Secondary Dressing Secured With Compression Wrap Compression Stockings Add-Ons Electronic Signature(s) Signed: 06/12/2022 1:44:58 PM By: Worthy Keeler PA-C Signed: 06/17/2022 7:35:08 PM By: Gretta Cool, BSN, RN, CWS, Kim RN, BSN Previous Signature: 06/12/2022 12:04:39 PM Version By: Gretta Cool, BSN, RN, CWS, Kim RN, BSN Previous Signature: 06/12/2022 10:46:23 AM Version By: Gretta Cool, BSN, RN, CWS, Kim RN, BSN Entered By: Worthy Keeler on 06/12/2022 13:44:58 -------------------------------------------------------------------------------- Cane [place] Details Patient Name: Date of Service: [place], [place] 06/12/2022 10:00 [place] Record Number: [id number] Patient Account Number: [id number] Date of Birth/Sex: Treating RN: 1950/07/17 (72 y.o. [name] Primary Care Hadlei Stitt: Royetta Crochet Other Clinician: Referring Esbeydi Manago: Treating Girolamo Lortie/Extender: Toy Care in Treatment: 0 Vital Signs Time Taken: 10:25 Temperature (F): 97.9 Height (in): 71 Pulse (bpm): 60 Weight (lbs): 263 Respiratory Rate (breaths/min): 16 Body Mass Index (BMI): 36.7 Blood Pressure (mmHg): 147/78 Reference Range: 80 - 120 mg / dl Electronic Signature(s) Signed: 06/12/2022 12:04:39 PM By: Gretta Cool, BSN, RN, CWS, Kim RN, BSN Entered By: Gretta Cool, BSN, RN, CWS,  Kim on 06/12/2022 10:26:16

## 2022-06-12 NOTE — Progress Notes (Signed)
**Note Blanchard-Identified via Obfuscation** Jack Blanchard, Jack Blanchard ([id number]) [id number] Page 1 of 9 Visit Report for 06/12/2022 Chief Complaint Document Details Patient Name: Date of Service: Jack Blanchard, Jack Blanchard 06/12/2022 10:00 [place] Record Number: [id number] Patient Account Number: [id number] Date of Birth/Sex: Treating RN: 1951-04-18 (72 y.o. Jack Blanchard Primary Care Provider: Royetta Crochet Other Clinician: Referring Provider: Treating Provider/Extender: Toy Care in Treatment: 0 Information Obtained from: Patient Chief Complaint Right hip replacement with surgical wound dehiscence Electronic Signature(s) Signed: 06/12/2022 10:50:59 AM By: Worthy Keeler PA-C Entered By: Worthy Keeler on 06/12/2022 10:50:59 -------------------------------------------------------------------------------- Debridement Details Patient Name: Date of Service: Jack Blanchard, Jack Blanchard 06/12/2022 10:00 [place] Record Number: [id number] Patient Account Number: [id number] Date of Birth/Sex: Treating RN: April 27, 1951 (72 y.o. Jack Blanchard Primary Care Provider: Royetta Crochet Other Clinician: Referring Provider: Treating Provider/Extender: Toy Care in Treatment: 0 Debridement Performed for Assessment: Wound #1 Right Trochanter Performed By: Physician Tommie Sams., PA-C Debridement Type: Debridement Level of Consciousness (Pre-procedure): Awake and Alert Pre-procedure Verification/Time Out Yes - 11:05 Taken: T Area Debrided (L x W): otal 10.2 (cm) x 2.8 (cm) = 28.56 (cm) Tissue and other material debrided: Viable, Non-Viable, Slough, Subcutaneous, Slough Level: Skin/Subcutaneous Tissue Debridement Description: Excisional Instrument: Curette Bleeding: Moderate Hemostasis Achieved: Pressure Response to Treatment: Procedure was tolerated well Level of Consciousness (Post- Awake and Alert procedure): Post Debridement Measurements of Total Wound Jack, Blanchard ([id number])  [id number] Page 2 of 9 Length: (cm) 10.2 Width: (cm) 2.8 Depth: (cm) 2.5 Volume: (cm) 56.077 Character of Wound/Ulcer Post Debridement: Stable Post Procedure Diagnosis Same as Pre-procedure Electronic Signature(s) Signed: 06/12/2022 12:04:39 PM By: Gretta Cool, BSN, RN, CWS, Kim RN, BSN Signed: 06/12/2022 2:17:33 PM By: Worthy Keeler PA-C Entered By: Gretta Cool, BSN, RN, CWS, Kim on 06/12/2022 11:05:50 -------------------------------------------------------------------------------- HPI Details Patient Name: Date of Service: Jack Blanchard, Jack Blanchard 06/12/2022 10:00 [place] Record Number: [id number] Patient Account Number: [id number] Date of Birth/Sex: Treating RN: 1951-01-15 (73 y.o. Jack Blanchard Primary Care Provider: Royetta Crochet Other Clinician: Referring Provider: Treating Provider/Extender: Toy Care in Treatment: 0 History of Present Illness HPI Description: 06-12-2022 upon evaluation today patient presents for initial inspection here in the clinic concerning issues that he has been having with a wound currently on the right upper leg location. 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 With that being said there is definitely a deep enough area to support muscle exposure although fortunately I do not think there is any structure that is going to be covered with any type of contact later this is good news. He has a lot of debridement the needs to happen as far as some adherent slough and necrotic tissue also appears to have a seroma pocket which I found upon inspection of the wound today as well. Overall I think that if we manage these issues and continue with the wound VAC appropriately placed and keeping it in place this should help this area to heal effectively and quickly. I discussed that with the patient today as well. And he is happy to proceed with the plan. Nonetheless this is a wound that initially occurred  on January 5 when the patient had a total hip replacement. Since that time he tells me that he has had the wound VAC since pretty much the surgery initially I think it was being used to try to help with the surgical incision site and then once a day he  has they have been packing this and performing the dressing changes at his orthopedic office. 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 Electronic Signature(s) Signed: 06/12/2022 1:45:58 PM By: Worthy Keeler PA-C Entered By: Worthy Keeler on 06/12/2022 13:45:58 -------------------------------------------------------------------------------- Physical Exam Details Patient Name: Date of Service: [place], [place] 06/12/2022 10:00 A M Medical Record Number: [id number] Patient Account Number: [id number] Date of Birth/Sex: Treating RN: [birth year] (72 y.o. Jack Blanchard [place], [place] ([id number]) [id number] Page 3 of 9 Primary Care Provider: Royetta Crochet Other Clinician: Referring Provider: Treating Provider/Extender: Toy Care in Treatment: 0 Constitutional patient is hypertensive.. pulse regular and within target range for patient.Marland Kitchen respirations regular, non-labored and within target range for patient.Marland Kitchen temperature within target range for patient.. Obese and well-hydrated in no acute distress. Eyes conjunctiva clear no eyelid edema noted. pupils equal round and reactive to light and accommodation. Ears, Nose, Mouth, and Throat no gross abnormality of ear auricles or external auditory canals. normal hearing noted during conversation. mucus membranes moist. Respiratory normal breathing without difficulty. Cardiovascular 2+ dorsalis pedis/posterior tibialis pulses. no clubbing, cyanosis, significant edema, <3 sec  cap refill. Psychiatric this patient is able to make decisions and demonstrates good insight into disease process. Alert and Oriented x 3. pleasant and cooperative. Notes Upon inspection patient's wound bed actually showed signs of the need for sharp debridement that was performed today without complication he did have some minimal discomfort fortunately nothing too significant and I was able to get this cleaned up quite nicely. I did also find an evacuated seroma pocket which again should help and I did not find anything that went down to hardware bone which is great news and in general and very pleased in that regard. Electronic Signature(s) Signed: 06/12/2022 1:46:27 PM By: Worthy Keeler PA-C Entered By: Worthy Keeler on 06/12/2022 13:46:27 -------------------------------------------------------------------------------- Physician Orders Details Patient Name: Date of Service: Jack Blanchard, Jack Blanchard 06/12/2022 10:00 [place] Record Number: [id number] Patient Account Number: [id number] Date of Birth/Sex: Treating RN: [birth year] (72 y.o. 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 - Twice weekly for NPWT changes. Tues/Friday or Mon/Thursday depending on appointments with ortho. Return A Nurse Visit as needed Bathing/ Shower/ Hygiene No tub bath. Negative Pressure Wound Therapy Wound VAC settings at 178mHg continuous pressure. Use foam to wound cavity. Please order WHITE foam to fill any  tunnel/s and/or WBRAESON, RUPE([id number] Page 4 of 9 undermining when necessary. Change VAC dressing 2 X WEEK. Change canister as indicated when full. Number of foam/gauze pieces used in the dressing = - 2 Wound Treatment Consults Orthopedics - Referred by ortho. Electronic Signature(s) Signed: 06/12/2022 12:04:39 PM By: WGretta Cool BSN, RN, CWS, Kim RN, BSN Signed: 06/12/2022 2:17:33 PM By: SWorthy KeelerPA-C Entered By: WGretta CoolBSN, RN, CWS, Kim on 06/12/2022 11:36:17 -------------------------------------------------------------------------------- Problem List Details Patient Name: Date of Service: Jack Blanchard, Jack Blanchard  06/12/2022 10:00 A M Medical Record Number: [id number] Patient Account Number: [id number] Date of Birth/Sex: Treating RN: [birth year] (72 y.o. Jack Blanchard Primary Care Provider: Royetta Crochet Other Clinician: Referring Provider: Treating Provider/Extender: Toy Care in Treatment: 0 Active Problems ICD-10 Encounter Code Description Active Date MDM Diagnosis T81.31XA Disruption of external operation (surgical) wound, not elsewhere classified, 06/12/2022 No Yes initial encounter L98.492 Non-pressure chronic ulcer of skin of other sites with fat layer exposed 06/12/2022 No Yes Z96.641 Presence of right artificial hip joint 06/12/2022 No Yes I10 Essential (primary) hypertension 06/12/2022 No Yes Inactive Problems Resolved Problems Electronic Signature(s) Signed: 06/12/2022 10:50:26 AM By: Worthy Keeler PA-C Entered By: Worthy Keeler on 06/12/2022 10:50:26 Jack Blanchard ([id number]) [id number] Page 5 of 9 -------------------------------------------------------------------------------- Progress Note Details Patient Name: Date of Service: Jack Blanchard, Jack Blanchard 06/12/2022 10:00 A M Medical Record Number: [id number] Patient Account Number: [id number] Date of Birth/Sex: Treating RN: 09/16/1950 (72 y.o. Jack Blanchard Primary Care Provider: Royetta Crochet Other Clinician: Referring Provider: Treating Provider/Extender: Toy Care in Treatment: 0 Subjective Chief Complaint Information obtained from Patient Right hip replacement with surgical wound dehiscence History of Present Illness (HPI) 06-12-2022 upon evaluation today patient presents for initial inspection here in the clinic concerning issues that he has been having with a wound currently on the right upper leg location. 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 With that being said there is definitely a deep enough area to support muscle exposure although fortunately I do not think there is any structure that is going to be covered with any type of contact later this is good news. He has a lot of debridement the needs to happen as far as some adherent slough and necrotic tissue also appears to have a seroma pocket which I found upon inspection of the wound today as well. Overall I think that if we manage these issues and continue with the wound VAC appropriately placed and keeping it in place this should help this area to heal effectively and quickly. I discussed that with the patient today as well. And he is happy to proceed with the plan. Nonetheless this is a wound that initially occurred on January 5 when the patient had a total hip replacement. Since that time he tells me that he has had the wound VAC since pretty much the surgery initially I think it was being used to try to help with the surgical incision site and then once a day he has they have been packing this and performing the dressing changes at his orthopedic office. 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 Medical History Respiratory Patient has history of Asthma, Chronic Obstructive Pulmonary Disease (COPD) Denies history of Sleep Apnea Cardiovascular Patient has history of Hypertension Endocrine Denies history of Type II Diabetes Musculoskeletal Patient has history of Osteoarthritis - legs and hands Neurologic Patient has history of Neuropathy - feet and hands Review of Systems (ROS) Constitutional Symptoms (General Health) Denies complaints or symptoms of Fatigue, Fever. Eyes Denies complaints or symptoms of Dry Eyes, Vision Changes, Glasses /  Contacts. Ear/Nose/Mouth/Throat Denies complaints or symptoms of Difficult clearing ears, Sinusitis. Hematologic/Lymphatic Denies complaints or symptoms of Bleeding / Clotting Disorders, Human Immunodeficiency Virus. Respiratory Complains or has symptoms of Shortness of Breath. Cardiovascular Complains or has symptoms of LE edema. Gastrointestinal Denies complaints or symptoms of Frequent diarrhea, Nausea, Vomiting. Genitourinary Complains or has symptoms of Incontinence/dribbling. Denies complaints or symptoms of Kidney failure/ Dialysis. Immunological Denies complaints or symptoms of Hives, Itching. Integumentary (Skin) Complains or has symptoms of Wounds. Denies complaints or symptoms of Bleeding or bruising tendency. Jack Blanchard, Jack Blanchard ([id number]) [id number] Page 6 of 9 Neurologic Denies complaints or symptoms of Numbness/parasthesias, Focal/Weakness. Psychiatric Denies complaints or symptoms of Anxiety, Claustrophobia. Objective Constitutional patient is hypertensive.. pulse regular and within target range for patient.Marland Kitchen respirations regular, non-labored and within target range for  patient.Marland Kitchen temperature within target range for patient.. Obese and well-hydrated in no acute distress. Vitals Time Taken: 10:25 AM, Height: 71 in, Weight: 263 lbs, BMI: 36.7, Temperature: 97.9 F, Pulse: 60 bpm, Respiratory Rate: 16 breaths/min, Blood Pressure: 147/78 mmHg. Eyes conjunctiva clear no eyelid edema noted. pupils equal round and reactive to light and accommodation. Ears, Nose, Mouth, and Throat no gross abnormality of ear auricles or external auditory canals. normal hearing noted during conversation. mucus membranes moist. Respiratory normal breathing without difficulty. Cardiovascular 2+ dorsalis pedis/posterior tibialis pulses. no clubbing, cyanosis, significant edema, Psychiatric this patient is able to make decisions and demonstrates good insight into disease process. Alert and Oriented x 3. pleasant and cooperative. General Notes: Upon inspection patient's wound bed actually showed signs of the need for sharp debridement that was performed today without complication he did have some minimal discomfort fortunately nothing too significant and I was able to get this cleaned up quite nicely. I did also find an evacuated seroma pocket which again should help and I did not find anything that went down to hardware bone which is great news and in general and very pleased in that regard. Integumentary (Hair, Skin) Wound #1 status is Open. Original cause of wound was Surgical Injury. The date acquired was: 05/22/2022. The wound is located on the Right Upper Leg. The wound measures 10.2cm length x 2.8cm width x 2.5cm depth; 22.431cm^2 area and 56.077cm^3 volume. There is muscle and Fat Layer (Subcutaneous Tissue) exposed. There is no undermining noted, however, there is tunneling at 2:00 with a maximum distance of 3cm. There is a large amount of sanguinous drainage noted. The wound margin is flat and intact. There is medium (34-66%) red granulation within the wound bed. There is a medium  (34-66%) amount of necrotic tissue within the wound bed including Adherent Slough. 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 There was a Excisional Skin/Subcutaneous Tissue Debridement with a total area of 28.56 sq cm performed by Tommie Sams., PA-C. With the following instrument(s): Curette to remove Viable and Non-Viable tissue/material. Material removed includes Subcutaneous Tissue and Slough and. No specimens were taken. A time out was conducted at 11:05, prior to the start of the procedure. A Moderate amount of bleeding was controlled with Pressure. The procedure was tolerated well. Post Debridement Measurements: 10.2cm length x 2.8cm width x 2.5cm depth; 56.077cm^3 volume. Character of Wound/Ulcer Post Debridement is stable. Post procedure Diagnosis Wound #  1: Same as Pre-Procedure Plan Follow-up Appointments: Return Appointment in 1 week. - Twice weekly for NPWT changes. Tues/Friday or Mon/Thursday depending on appointments with ortho. Jack Blanchard, Jack Blanchard ([id number]) [id number] Page 7 of 9 Nurse Visit as needed Bathing/ Shower/ Hygiene: No tub bath. Negative Pressure Wound Therapy: Wound VAC settings at 133mHg continuous pressure. Use foam to wound cavity. Please order WHITE foam to fill any tunnel/s and/or undermining when necessary. Change VAC dressing 2 X WEEK. Change canister as indicated when full. Number of foam/gauze pieces used in the dressing = - 2 Consults ordered were: Orthopedics - Referred by ortho. 1. I am going to suggest that we have the patient continue to monitor for any signs of infection or worsening.  Overall I do believe that he is moving in the right direction and with the wound VAC I think we can continue to get this moving in the right direction. Working to be doing this 2 times per week as far as the dressing changes are concerned with the wound VAC I would also go to the standard drape not the silicone. I just do not think it adheres as well and he is having trouble keeping this in place anyway. I do not believe there is any need for white foam at this point working to be using the black foam only. 2. I am good recommend the patient should keep this in place in between dressing changes though he was given indication know what to do if this comes off and that he will advise [place] well but again a wet-to-dry dressing is can be appropriate in the interim until he gets back so he does not have to go into the ER if something were to happen as such. We will see patient back for reevaluation in 1 week here in the clinic. If anything worsens or changes patient will contact our office for additional recommendations. Electronic Signature(s) Signed: 06/12/2022 1:47:28 PM By: SWorthy KeelerPA-C Entered By: SWorthy Keeleron 06/12/2022 13:47:28 -------------------------------------------------------------------------------- ROS/PFSH Details Patient Name: Date of Service: [place]1/26/2024 10:00 [place] CityRecord Number: [id number] Account Number: [id number] of Birth/Sex: Treating RN: 810/23/1952(72y.o. MVerl BlalockPrimary Care Provider: RRoyetta CrochetOther Clinician: Referring Provider: Treating Provider/Extender: SToy Carein Treatment: 0 Constitutional Symptoms (General Health) Complaints and Symptoms: Negative for: Fatigue; Fever Eyes Complaints and Symptoms: Negative for: Dry Eyes; Vision Changes; Glasses / Contacts Ear/Nose/Mouth/Throat Complaints and Symptoms: Negative for: Difficult clearing ears;  Sinusitis Hematologic/Lymphatic Complaints and Symptoms: Negative for: Bleeding / Clotting Disorders; Human Immunodeficiency Virus Respiratory Complaints and Symptoms: Positive for: Shortness of Breath Medical History: Positive for: Asthma; Chronic Obstructive Pulmonary Disease (COPD) Negative for: Sleep Apnea Jack Blanchard, Jack Blanchard([id number] Page 8 of 9 Cardiovascular Complaints and Symptoms: Positive for: LE edema Medical History: Positive for: Hypertension Gastrointestinal Complaints and Symptoms: Negative for: Frequent diarrhea; Nausea; Vomiting Genitourinary Complaints and Symptoms: Positive for: Incontinence/dribbling Negative for: Kidney failure/ Dialysis Immunological Complaints and Symptoms: Negative for: Hives; Itching Integumentary (Skin) Complaints and Symptoms: Positive for: Wounds Negative for: Bleeding or bruising tendency Neurologic Complaints and Symptoms: Negative for: Numbness/parasthesias; Focal/Weakness Medical History: Positive for: Neuropathy - feet and hands Psychiatric Complaints and Symptoms: Negative for: Anxiety; Claustrophobia Endocrine Medical History: Negative for: Type II Diabetes Musculoskeletal Medical History: Positive for: Osteoarthritis - legs and hands Oncologic Immunizations Pneumococcal Vaccine: Received Pneumococcal Vaccination: Yes Received Pneumococcal Vaccination On or After 60th Birthday: Yes Implantable Devices No devices added Family  and Social History Current every day smoker; Marital Status - Divorced; Alcohol Use: Moderate; Drug Use: Current History - marjuiana; Caffeine Use: Moderate Electronic Signature(s) Signed: 06/12/2022 12:04:39 PM By: Gretta Cool, BSN, RN, CWS, Kim RN, BSN Signed: 06/12/2022 2:17:33 PM By: Worthy Keeler PA-C Entered By: Gretta Cool BSN, RN, CWS, Kim on 06/12/2022 10:31:13 Jack Blanchard ([id number]) [id number] Page 9 of  9 -------------------------------------------------------------------------------- SuperBill Details Patient Name: Date of Service: [place], [place] 06/12/2022 Medical Record Number: [id number] Patient Account Number: [id number] Date of Birth/Sex: Treating RN: [birth year] (72 y.o. 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 I10 Essential (primary) hypertension Facility Procedures : CPT4 Code: 46659935 Description: [place] VISIT-LEV 3 EST PT Modifier: Quantity: 1 : CPT4 Code: 70177939 Description: 03009 - DEB SUBQ TISSUE 20 SQ CM/< ICD-10 Diagnosis Description L98.492 Non-pressure chronic ulcer of skin of other sites with fat layer exposed Modifier: Quantity: 1 : CPT4 Code: 23300762 Description: 26333 - DEB SUBQ TISS EA ADDL 20CM ICD-10 Diagnosis Description L98.492 Non-pressure chronic ulcer of skin of other sites with fat layer exposed Modifier: Quantity: 1 : CPT4 Code: 54562563 Description: 89373 - WOUND VAC-50 SQ CM OR LESS Modifier: Quantity: 1 Physician Procedures : CPT4 Code Description Modifier [id number] 15726 - WC PHYS LEVEL 4 - NEW PT 25 ICD-10 Diagnosis Description T81.31XA Disruption of external operation (surgical) wound, not elsewhere classified, initial encounter L98.492 Non-pressure chronic ulcer of skin  of other sites with fat layer exposed Z96.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

## 2022-06-18 ENCOUNTER — Ambulatory Visit: Payer: 59 | Admitting: Physician Assistant

## 2022-06-19 ENCOUNTER — Encounter: Payer: 59 | Attending: Physician Assistant

## 2022-06-19 DIAGNOSIS — I10 Essential (primary) hypertension: Secondary | ICD-10-CM | POA: Insufficient documentation

## 2022-06-19 DIAGNOSIS — L98492 Non-pressure chronic ulcer of skin of other sites with fat layer exposed: Secondary | ICD-10-CM | POA: Diagnosis not present

## 2022-06-19 DIAGNOSIS — Z96641 Presence of right artificial hip joint: Secondary | ICD-10-CM | POA: Insufficient documentation

## 2022-06-19 DIAGNOSIS — T8131XA Disruption of external operation (surgical) wound, not elsewhere classified, initial encounter: Secondary | ICD-10-CM | POA: Diagnosis present

## 2022-06-19 DIAGNOSIS — Y831 Surgical operation with implant of artificial internal device as the cause of abnormal reaction of the patient, or of later complication, without mention of misadventure at the time of the procedure: Secondary | ICD-10-CM | POA: Insufficient documentation

## 2022-06-19 NOTE — Progress Notes (Signed)
[name], CONE ([id number]) [id number] Page 1 of 4 Visit Report for 06/19/2022 Arrival Information Details Patient Name: Date of Service: [name], [name] 06/19/2022 8:00 [place] Record Number: [id number] Patient Account Number: [id number] Date of Birth/Sex: Treating RN: December 15, 1950 (72 y.o. Jack Blanchard) Carlene Coria Primary Care Keiyana Stehr: Royetta Crochet Other Clinician: Referring Georgene Kopper: Treating Ennis Delpozo/Extender: Gaynelle Cage in Treatment: 1 Visit Information History Since Last Visit Added or deleted any medications: No Patient Arrived: Ambulatory Any new allergies or adverse reactions: No Arrival Time: 08:00 Had a fall or experienced change in No Accompanied By: self activities of daily living that may affect Transfer Assistance: None risk of falls: Patient Identification Verified: Yes Signs or symptoms of abuse/neglect since last visito No Secondary Verification Process Completed: Yes Hospitalized since last visit: No Patient Has Alerts: Yes Implantable device outside of the clinic excluding No Patient Alerts: Patient on Blood Thinner cellular tissue based products placed in the center '81mg'$  aspririn since last visit: Has Dressing in Place as Prescribed: Yes Has Footwear/Offloading in Place as Prescribed: Yes Pain Present Now: No Electronic Signature(s) Signed: 06/19/2022 9:13:12 AM By: Carlene Coria RN Entered By: Carlene Coria on 06/19/2022 09:13:11 -------------------------------------------------------------------------------- Clinic Level of Care Assessment Details Patient Name: Date of Service: [name], [name] 06/19/2022 8:00 [place] Record Number: [id number] Patient Account Number: [id number] Date of Birth/Sex: Treating RN: 10/27/1950 (71 y.o. Jack Blanchard) Carlene Coria Primary Care Eula Jaster: Royetta Crochet Other Clinician: Referring Adeel Guiffre: Treating Brylie Sneath/Extender: Gaynelle Cage in Treatment: 1 Clinic Level of Care  Assessment Items TOOL 1 Quantity Score '[]'$  - 0 Use when EandM and Procedure is performed on INITIAL visit ASSESSMENTS - Nursing Assessment / Reassessment '[]'$  - 0 General Physical Exam (combine w/ comprehensive assessment (listed just below) when performed on new pt. evals) '[]'$  - 0 Comprehensive Assessment (HX, ROS, Risk Assessments, Wounds Hx, etc.) Barbier, [name] ([id number]) [phone] Page 2 of 4 ASSESSMENTS - Wound and Skin Assessment / Reassessment '[]'$  - 0 Dermatologic / Skin Assessment (not related to wound area) ASSESSMENTS - Ostomy and/or Continence Assessment and Care '[]'$  - 0 Incontinence Assessment and Management '[]'$  - 0 Ostomy Care Assessment and Management (repouching, etc.) PROCESS - Coordination of Care '[]'$  - 0 Simple Patient / Family Education for ongoing care '[]'$  - 0 Complex (extensive) Patient / Family Education for ongoing care '[]'$  - 0 Staff obtains Programmer, systems, Records, T Results / Process Orders est '[]'$  - 0 Staff telephones HHA, Nursing Homes / Clarify orders / etc '[]'$  - 0 Routine Transfer to another Facility (non-emergent condition) '[]'$  - 0 Routine Hospital Admission (non-emergent condition) '[]'$  - 0 New Admissions / Biomedical engineer / Ordering NPWT Apligraf, etc. , '[]'$  - 0 Emergency Hospital Admission (emergent condition) PROCESS - Special Needs '[]'$  - 0 Pediatric / Minor Patient Management '[]'$  - 0 Isolation Patient Management '[]'$  - 0 Hearing / Language / Visual special needs '[]'$  - 0 Assessment of Community assistance (transportation, D/C planning, etc.) '[]'$  - 0 Additional assistance / Altered mentation '[]'$  - 0 Support Surface(s) Assessment (bed, cushion, seat, etc.) INTERVENTIONS - Miscellaneous '[]'$  - 0 External ear exam '[]'$  - 0 Patient Transfer (multiple staff / Civil Service fast streamer / Similar devices) '[]'$  - 0 Simple Staple / Suture removal (25 or less) '[]'$  - 0 Complex Staple / Suture removal (26 or more) '[]'$  - 0 Hypo/Hyperglycemic Management  (do not check if billed separately) '[]'$  - 0 Ankle / Brachial Index (ABI) - do not check if billed separately Has the  patient been seen at the hospital within the last three years: Yes Total Score: 0 Level Of Care: ____ Electronic Signature(s) Unsigned Entered By: Carlene Coria on 06/19/2022 09:16:31 -------------------------------------------------------------------------------- Encounter Discharge Information Details Patient Name: Date of Service: [name], [name] 06/19/2022 8:00 [place] Record Number: [id number] Patient Account Number: [id number] Date of Birth/Sex: Treating RN: 1950-06-26 (72 y.o. Jack Blanchard Primary Care Doyne Ellinger: Royetta Crochet Other Clinician: [name] ([id number]) [id number] Page 3 of 4 Referring Luella Gardenhire: Treating Sherman Donaldson/Extender: Gaynelle Cage in Treatment: 1 Encounter Discharge Information Items Discharge Condition: Stable Ambulatory Status: Ambulatory Discharge Destination: Home Transportation: Private Auto Accompanied By: self Schedule Follow-up Appointment: Yes Clinical Summary of Care: Electronic Signature(s) Signed: 06/19/2022 9:16:09 AM By: Carlene Coria RN Entered By: Carlene Coria on 06/19/2022 09:16:09 -------------------------------------------------------------------------------- Negative Pressure Wound Therapy Maintenance (NPWT) Details Patient Name: Date of Service: [name], [name] 06/19/2022 8:00 [place] Record Number: [id number] Patient Account Number: [id number] Date of Birth/Sex: Treating RN: [birth year] (71 y.o. Jack Blanchard Primary Care Irvan Tiedt: Royetta Crochet Other Clinician: Referring Alonia Dibuono: Treating Jaleia Hanke/Extender: Gaynelle Cage in Treatment: 1 NPWT Maintenance Performed for: Wound #1 Right Upper Leg Performed By: Carlene Coria, RN Type: Other Coverage Size (sq cm): 28.56 Pressure Type: Constant Pressure Setting: 125 mmHG Drain Type: None Primary  Contact: None Sponge/Dressing Type: Foam, Black Date Initiated: 06/12/2022 Dressing Removed: No Quantity of Sponges/Gauze Removed: 1 black Canister Changed: No Dressing Reapplied: No Quantity of Sponges/Gauze Inserted: 1 black Days On NPWT : 8 Electronic Signature(s) Signed: 06/19/2022 9:15:01 AM By: Carlene Coria RN Entered By: Carlene Coria on 06/19/2022 09:15:01 Wound Assessment Details -------------------------------------------------------------------------------- [name] ([id number]) [id number] Page 4 of 4 Patient Name: Date of Service: [place], [place] 06/19/2022 8:00 [place] Medical Record Number: [id number] Patient Account Number: [id number] Date of Birth/Sex: Treating RN: 12/29/1950 (72 y.o. Jack Blanchard) Carlene Coria Primary Care Lissandra Keil: Royetta Crochet Other Clinician: Referring Jaren Vanetten: Treating Dinero Chavira/Extender: Gaynelle Cage in Treatment: 1 Wound Status Wound Number: 1 Primary Dehisced Wound Etiology: Wound Location: Right Upper Leg Wound Open Wounding Event: Surgical Injury Status: Date Acquired: 05/22/2022 Comorbid Asthma, Chronic Obstructive Pulmonary Disease (COPD), Weeks Of Treatment: 1 History: Hypertension, Osteoarthritis, Neuropathy Clustered Wound: No Wound Measurements Length: (cm) 10.2 % Reduction in Area: 0% Width: (cm) 2.8 % Reduction in Volume: 0% Depth: (cm) 2.5 Tunneling: No Area: (cm) 22.431 Undermining: No Volume: (cm) 56.077 Wound Description Classification: Full Thickness With Exposed Support Structures Foul Odor After Cleansing: No Wound Margin: Flat and Intact Slough/Fibrino Yes Exudate Amount: Medium Exudate Type: Serosanguineous Exudate Color: red, brown Wound Bed Granulation Amount: Large (67-100%) Exposed Structure Granulation Quality: Red Fascia Exposed: No Necrotic Amount: Small (1-33%) Fat Layer (Subcutaneous Tissue) Exposed: Yes Necrotic Quality: Adherent Slough Tendon Exposed: No Muscle  Exposed: Yes Necrosis of Muscle: No Joint Exposed: No Bone Exposed: No Treatment Notes Wound #1 (Upper Leg) Wound Laterality: Right Cleanser Peri-Wound Care Topical Primary Dressing Secondary Dressing Secured With Compression Wrap Compression Stockings Add-Ons Electronic Signature(s) Signed: 06/19/2022 9:13:50 AM By: Carlene Coria RN Entered By: Carlene Coria on 06/19/2022 09:13:50

## 2022-06-19 NOTE — Progress Notes (Signed)
CORKY, BLUMSTEIN ([id number]) [id number] Page 1 of 2 Visit Report for 06/19/2022 Physician Orders Details Patient Name: Date of Service: [place], [place] 06/19/2022 8:00 [place] Record Number: [id number] Patient Account Number: [id number] Date of Birth/Sex: Treating RN: January 06, 1951 (72 y.o. Jerilynn Mages) Carlene Coria Primary Care Provider: Royetta Crochet Other Clinician: Referring Provider: Treating Provider/Extender: Gaynelle Cage in Treatment: 1 Verbal / Phone Orders: No Diagnosis Coding Follow-up Appointments ppointment in 1 week. - Twice weekly for NPWT changes. Tues/Friday or Mon/Thursday depending on appointments with ortho. Return A Nurse Visit as needed Bathing/ Shower/ Hygiene No tub bath. Negative Pressure Wound Therapy Wound VAC settings at 162mHg continuous pressure. Use foam to wound cavity. Please order WHITE foam to fill any tunnel/s and/or undermining when necessary. Change VAC dressing 2 X WEEK. Change canister as indicated when full. Number of foam/gauze pieces used in the dressing = - 1 Wound Treatment Electronic Signature(s) Signed: 06/19/2022 9:15:48 AM By: ECarlene CoriaRN Signed: 06/19/2022 2:41:09 PM By: SWorthy KeelerPA-C Entered By: ECarlene Coriaon 06/19/2022 09:15:47 -------------------------------------------------------------------------------- SuperBill Details Patient Name: [place] Medical Record Number: [id number] Account Number: [id number] of Birth/Sex: Treating RN: [birth year](71 y.o. 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

## 2022-06-22 ENCOUNTER — Encounter: Payer: 59 | Admitting: Physician Assistant

## 2022-06-22 ENCOUNTER — Other Ambulatory Visit
Admission: RE | Admit: 2022-06-22 | Discharge: 2022-06-22 | Disposition: A | Discharge status: Home / Self Care | Payer: 59 | Source: Ambulatory Visit | Attending: Physician Assistant | Admitting: Physician Assistant

## 2022-06-22 DIAGNOSIS — T8131XA Disruption of external operation (surgical) wound, not elsewhere classified, initial encounter: Secondary | ICD-10-CM | POA: Diagnosis not present

## 2022-06-22 DIAGNOSIS — L089 Local infection of the skin and subcutaneous tissue, unspecified: Secondary | ICD-10-CM | POA: Insufficient documentation

## 2022-06-22 NOTE — Progress Notes (Addendum)
Jack Blanchard, Jack Blanchard ([id number]) [id number] Page 1 of 6 Visit Report for 06/22/2022 Chief Complaint Document Details Patient Name: Date of Service: [place], [place] 06/22/2022 9:00 [place] Record Number: [id number] Patient Account Number: [id number] Date of Birth/Sex: Treating RN: 04-14-1951 (72 y.o. Jack Blanchard) Carlene Coria Primary Care Provider: Royetta Crochet Other Clinician: Referring Provider: Treating Provider/Extender: Gaynelle Cage in Treatment: 1 Information Obtained from: Patient Chief Complaint Right hip replacement with surgical wound dehiscence Electronic Signature(s) Signed: 06/22/2022 9:24:43 AM By: Worthy Keeler PA-C Entered By: Worthy Keeler on 06/22/2022 09:24:43 -------------------------------------------------------------------------------- HPI Details Patient Name: Date of Service: [place], [place] 06/22/2022 9:00 [place] Record Number: [id number] Patient Account Number: [id number] Date of Birth/Sex: Treating RN: 10/25/1950 (72 y.o. Jack Blanchard) Carlene Coria Primary Care Provider: Royetta Crochet Other Clinician: Referring Provider: Treating Provider/Extender: Gaynelle Cage in Treatment: 1 History of Present Illness HPI Description: 06-12-2022 upon evaluation today patient presents for initial inspection here in the clinic concerning issues that he has been having with a wound currently on the right upper leg location. 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 With that being said there is definitely a deep enough area to support muscle exposure although fortunately I do not think there is any structure that is going to be covered with any type of contact later this is good news. He has a lot of debridement the needs to happen as far as some adherent slough and necrotic tissue also appears to have a seroma pocket which I found upon inspection of the wound today as well. Overall I think that if we  manage these issues and continue with the wound VAC appropriately placed and keeping it in place this should help this area to heal effectively and quickly. I discussed that with the patient today as well. And he is happy to proceed with the plan. Nonetheless this is a wound that initially occurred on January 5 when the patient had a total hip replacement. Since that time he tells me that he has had the wound VAC since pretty much the surgery initially I think it was being used to try to help with the surgical incision site and then once a day he has they have been packing this and performing the dressing changes at his orthopedic office. 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 06-22-2022 upon evaluation today patient appears to be doing well currently in regard to his wound other than the fact that there was some odor according to the nurse after removing the wound VAC. I am a little concerned about the possibility of infection and for that reason I am going to go ahead and obtain a wound culture. AKHILESH, SPARR ([id number]) [id number] Page 2 of 6 Electronic Signature(s) Signed: 06/22/2022 6:01:17 PM By: Worthy Keeler PA-C Entered By: Worthy Keeler on 06/22/2022 18:01:17 -------------------------------------------------------------------------------- Physical Exam Details Patient Name: Date of Service: [place], [place] 06/22/2022 9:00 [place] Record Number: [id number] Patient Account Number: [id number] Date of Birth/Sex: Treating RN: 1950/09/30 (72 y.o. Jack Blanchard Primary Care Provider: Royetta Crochet Other Clinician: Referring Provider: Treating Provider/Extender: Gaynelle Cage in Treatment: 1 Constitutional Obese and well-hydrated in no acute  distress. Respiratory normal breathing without difficulty. Psychiatric  this patient is able to make decisions and demonstrates good insight into disease process. Alert and Oriented x 3. pleasant and cooperative. Notes Upon inspection patient's wound bed actually did not require any sharp debridement it looks to be doing quite well that regard. Do not see any signs of infection locally or systemically which is great news. Electronic Signature(s) Signed: 06/22/2022 6:01:33 PM By: Worthy Keeler PA-C Entered By: Worthy Keeler on 06/22/2022 18:01:32 -------------------------------------------------------------------------------- Physician Orders Details Patient Name: Date of Service: [place], [place] 06/22/2022 9:00 [place] Record Number: [id number] Patient Account Number: [id number] Date of Birth/Sex: Treating RN: 06-20-1950 (72 y.o. 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 - Twice weekly for NPWT changes. Tues/Friday or Mon/Thursday depending on appointments with ortho. Return A Nurse Visit as needed Bathing/ Shower/ Hygiene No tub bath. Negative Pressure Wound Therapy Wound VAC settings at 182mHg continuous pressure. Use foam to wound cavity. Please order WHITE foam to fill any tunnel/s and/or undermining when necessary. Change VAC dressing 2 X WEEK. Change canister as indicated when full. Number of  foam/gauze pieces used in the dressing = - 1 Wound Treatment Laboratory Bacteria identified in Wound by Culture (MICRO) - non healing wound - (ICD10 L98.492 - Non-pressure chronic ulcer of skin of other sites with fat layer exposed) LOINC Code: [id number] Name: Wound culture routine Patient Medications llergies: doxycycline hyclate, vancomycin, acetaminophen A Notifications Medication Indication Start End 06/22/2022 Bactrim DS DOSE 1 - oral 800 mg-160 mg tablet - 1 tablet oral twice a day x 14 days Electronic Signature(s) Signed: 06/25/2022 6:29:11 PM By: SWorthy KeelerPA-C Signed: 06/26/2022 12:08:06 PM By: ECarlene CoriaRN Previous Signature: 06/22/2022 9:33:43 AM Version By: SWorthy KeelerPA-C Entered By: ECarlene Coriaon 06/24/2022 08:17:49 -------------------------------------------------------------------------------- Problem List Details Patient Name: [place] 9:00 [place] Number: [id number] Account Number: [id number] of Birth/Sex: Treating RN: 812-05-1950(71 y.o. MJerilynn Blanchard ECarlene CoriaPrimary Care Provider: RRoyetta CrochetOther Clinician: Referring Provider: Treating Provider/Extender: SGaynelle Cagein Treatment: 1 Active Problems ICD-10 Encounter Code Description Active Date MDM Diagnosis T81.31XA Disruption of external operation (surgical) wound, not elsewhere classified, 06/12/2022 No Yes initial encounter L98.492 Non-pressure chronic ulcer of skin of other sites with fat layer exposed 06/12/2022 No Yes Mccoll, Jack Blanchard([id number] [phone] Page 4 of 6 [phone] of right artificial hip joint 06/12/2022 No Yes I10 Essential (primary) hypertension 06/12/2022 No Yes Inactive Problems Resolved Problems Electronic Signature(s) Signed: 06/22/2022 9:24:38 AM By: SWorthy KeelerPA-C Entered By: SWorthy Keeleron 06/22/2022  09:24:38 -------------------------------------------------------------------------------- Progress Note Details Patient Name: Date of Service: [place]20[place] 9:00 ATrentRecord Number: [id number] Account Number: [id number] of Birth/Sex: Treating RN: 8Aug 23, 1952(71 y.o. MOval LinseyPrimary Care Provider: RRoyetta CrochetOther Clinician: Referring Provider: Treating Provider/Extender: SGaynelle Cagein Treatment: 1 Subjective Chief Complaint Information obtained from Patient Right hip replacement with surgical wound dehiscence History of Present Illness (HPI) 06-12-2022  upon evaluation today patient presents for initial inspection here in the clinic concerning issues that he has been having with a wound currently on the right upper leg location. 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 With that being said there is definitely a deep enough area to support muscle exposure although fortunately I do not think there is any structure that is going to be covered with any type of contact later this is good news. He has a lot of debridement the needs to happen as far as some adherent slough and necrotic tissue also appears to have a seroma pocket which I found upon inspection of the wound today as well. Overall I think that if we manage these issues and continue with the wound VAC appropriately placed and keeping it in place this should help this area to heal effectively and quickly. I discussed that with the patient today as well. And he is happy to proceed with the plan. Nonetheless this is a wound that initially occurred on January 5 when the patient had a total hip replacement. Since that time he tells me that he has had the wound VAC since pretty much the surgery initially I think it was being used to try to help with the surgical incision site and then once a day he has they have been packing this and performing the  dressing changes at his orthopedic office. 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 06-22-2022 upon evaluation today patient appears to be doing well currently in regard to his wound other than the fact that there was some odor according to the nurse after removing the wound VAC. I am a little concerned about the possibility of infection and for that reason I am going to go ahead and obtain a wound culture. Objective Constitutional Obese and well-hydrated in no acute distress. RAMAR, GRASS ([id number]) [id number] Page 5 of 6 Vitals Time Taken: 9:15 AM, Height: 71 in, Weight: 263 lbs, BMI: 36.7, Temperature: 97.6 F, Pulse: 51 bpm, Respiratory Rate: 14 breaths/min, Blood Pressure: 133/72 mmHg. Respiratory normal breathing without difficulty. Psychiatric this patient is able to make decisions and demonstrates good insight into disease process. Alert and Oriented x 3. pleasant and cooperative. General Notes: Upon inspection patient's wound bed actually did not require any sharp debridement it looks to be doing quite well that regard. Do not see any signs of infection locally or systemically which is great news. Integumentary (Hair, Skin) Wound #1 status is Open. Original cause of wound was Surgical Injury. The date acquired was: 05/22/2022. The wound has been in treatment 1 weeks. The wound is located on the Right Upper Leg. The wound measures 9cm length x 1.6cm width x 0.8cm depth; 11.31cm^2 area and 9.048cm^3 volume. There is muscle and Fat Layer (Subcutaneous Tissue) exposed. There is no tunneling or undermining noted. There is a medium amount of serosanguineous drainage noted. The wound margin is flat and intact. There is large (67-100%) red granulation within the wound bed. There is  a small (1-33%) amount of necrotic tissue within the wound bed. 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 - Twice weekly for NPWT changes. Tues/Friday or Mon/Thursday depending on appointments with ortho. Nurse Visit as needed Bathing/ Shower/ Hygiene: No tub bath. Negative Pressure Wound Therapy: Wound VAC settings at 146mHg continuous pressure. Use foam to wound cavity. Please order WHITE foam to fill any tunnel/s and/or undermining when necessary. Change VAC dressing 2 X WEEK. Change canister as indicated when full. Number of foam/gauze pieces used in the dressing = - 1 The following medication(s) was prescribed: Bactrim DS oral 800 mg-160 mg tablet 1 1 tablet oral twice a day x 14 days starting 06/22/2022 1. I am going to suggest that we have the patient continue to utilize the wound VAC I am going to go ahead however and obtain a wound culture and I did actually go ahead and discussed with the patient as well that we may want to put him on a broad-spectrum antibiotic for starters. This includes Bactrim DS which I think would be a good option here for him. 2. I am going to recommend that as soon as the culture comes back to make any changes or adjustments as necessary. 3. I would also suggest the patient should continue to ensure the wound VAC stays intact and in place he has been doing a really good job with this to be honest. We will see patient back for reevaluation in 1 week here in the clinic. If anything worsens or changes patient will contact our office for additional recommendations. Electronic Signature(s) Signed: 06/22/2022 6:02:31 PM By: SWorthy KeelerPA-C Entered By: SWorthy Keeleron  06/22/2022 18:02:31 Schrodt, Jack Blanchard([id number] Page 6 of 6 -------------------------------------------------------------------------------- SuperBill Details Patient Name: [place] Medical Record Number: [id number] Account Number: [id number] of Birth/Sex: Treating RN: 808-27-1952(72y.o. 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 I10 Essential (primary) hypertension Facility Procedures : CPT4 Code: [id number]: [id number] [id number] VAC-50 SQ CM OR LESS Modifier: Quantity: 1 Physician Procedures : CPT4 Code Description Modifier [id number] PHYS LEVEL 4 - EST PT ICD-10 Diagnosis Description T81.31XA Disruption of external operation (surgical) wound, not elsewhere classified, initial encounter L98.492 Non-pressure chronic ulcer of skin of  other sites with fat layer exposed Z96.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

## 2022-06-25 ENCOUNTER — Ambulatory Visit: Payer: 59

## 2022-06-25 LAB — AEROBIC CULTURE W GRAM STAIN (SUPERFICIAL SPECIMEN)

## 2022-06-26 ENCOUNTER — Other Ambulatory Visit: Payer: Self-pay | Admitting: Internal Medicine

## 2022-06-26 ENCOUNTER — Ambulatory Visit
Admission: RE | Admit: 2022-06-26 | Discharge: 2022-06-26 | Disposition: A | Discharge status: Home / Self Care | Payer: 59 | Source: Ambulatory Visit | Attending: Internal Medicine | Admitting: Internal Medicine

## 2022-06-26 DIAGNOSIS — R109 Unspecified abdominal pain: Secondary | ICD-10-CM

## 2022-06-26 DIAGNOSIS — T8131XA Disruption of external operation (surgical) wound, not elsewhere classified, initial encounter: Secondary | ICD-10-CM | POA: Diagnosis not present

## 2022-06-26 NOTE — Progress Notes (Signed)
NEVON, MAYNES ([id number]) [id number] Page 1 of 8 Visit Report for 06/22/2022 Arrival Information Details Patient Name: Date of Service: [place], Jack [facility]ard 06/22/2022 9:00 [place] Record Number: [id number] Patient Account Number: [id number] Date of Birth/Sex: Treating RN: [birth year] (72 y.o. Jerilynn Mages) Carlene Coria Primary Care Sharmeka Palmisano: Royetta Crochet Other Clinician: Referring Panzy Bubeck: Treating Leanthony Rhett/Extender: Gaynelle Cage in Treatment: 1 Visit Information History Since Last Visit Added or deleted any medications: No Patient Arrived: Ambulatory Any new allergies or adverse reactions: No Arrival Time: 09:12 Had a fall or experienced change in No Accompanied By: self activities of daily living that may affect Transfer Assistance: None risk of falls: Patient Identification Verified: Yes Signs or symptoms of abuse/neglect since last visito No Secondary Verification Process Completed: Yes Hospitalized since last visit: No Patient Has Alerts: Yes Implantable device outside of the clinic excluding No Patient Alerts: Patient on Blood Thinner cellular tissue based products placed in the center 43m aspririn since last visit: Has Dressing in Place as Prescribed: Yes Pain Present Now: No Electronic Signature(s) Signed: 06/26/2022 12:08:06 PM By: ECarlene CoriaRN Entered By: ECarlene Coriaon 06/22/2022 09:15:52 -------------------------------------------------------------------------------- Clinic Level of Care Assessment Details Patient Name: Date of Service: WLeonard D[place]2/09/2022 9:00 [place]Record Number: [id number] Account Number: [id number] of Birth/Sex: Treating RN: [birth year](71 y.o. MJerilynn Mages ECarlene CoriaPrimary Care Kohen Reither: RRoyetta CrochetOther Clinician: Referring Miamarie Moll: Treating Mashell Sieben/Extender: SGaynelle Cagein Treatment: 1 Clinic Level of Care Assessment Items TOOL 1 Quantity Score []$  - 0 Use when  EandM and Procedure is performed on INITIAL visit ASSESSMENTS - Nursing Assessment / Reassessment []$  - 0 General Physical Exam (combine w/ comprehensive assessment (listed just below) when performed on new pt. evals) []$  - 0 Comprehensive Assessment (HX, ROS, Risk Assessments, Wounds Hx, etc.) Rainey, DKasandra Knudsen([id number] [phone] Page 2 of 8 ASSESSMENTS - Wound and Skin Assessment / Reassessment []$  - 0 Dermatologic / Skin Assessment (not related to wound area) ASSESSMENTS - Ostomy and/or Continence Assessment and Care []$  - 0 Incontinence Assessment and Management []$  - 0 Ostomy Care Assessment and Management (repouching, etc.) PROCESS - Coordination of Care []$  - 0 Simple Patient / Family Education for ongoing care []$  - 0 Complex (extensive) Patient / Family Education for ongoing care []$  - 0 Staff obtains CProgrammer, systems Records, T Results / Process Orders est []$  - 0 Staff telephones HHA, Nursing Homes / Clarify orders / etc []$  - 0 Routine Transfer to another Facility (non-emergent condition) []$  - 0 Routine Hospital Admission (non-emergent condition) []$  - 0 New Admissions / IBiomedical engineer/ Ordering NPWT Apligraf, etc. , []$  - 0 Emergency Hospital Admission (emergent condition) PROCESS - Special Needs []$  - 0 Pediatric / Minor Patient Management []$  - 0 Isolation Patient Management []$  - 0 Hearing / Language / Visual special needs []$  - 0 Assessment of Community assistance (transportation, D/C planning, etc.) []$  - 0 Additional assistance / Altered mentation []$  - 0 Support Surface(s) Assessment (bed, cushion, seat, etc.) INTERVENTIONS - Miscellaneous []$  - 0 External ear exam []$  - 0 Patient Transfer (multiple staff / HCivil Service fast streamer/ Similar devices) []$  - 0 Simple Staple / Suture removal (25 or less) []$  - 0 Complex Staple / Suture removal (26 or more) []$  - 0 Hypo/Hyperglycemic Management (do not check if billed separately) []$  - 0 Ankle /  Brachial Index (ABI) - do not check if billed separately Has the patient been seen at the hospital within  the last three years: Yes Total Score: 0 Level Of Care: ____ Electronic Signature(s) Signed: 06/26/2022 12:08:06 PM By: Carlene Coria RN Entered By: Carlene Coria on 06/22/2022 09:55:07 -------------------------------------------------------------------------------- Encounter Discharge Information Details Patient Name: Date of Service: [place], Jack [facility] 06/22/2022 9:00 [place] Record Number: [id number] Patient Account Number: [id number] Date of Birth/Sex: Treating RN: December 19, 1950 (71 y.o. [name] Primary Care Tanyika Barros: Royetta Crochet Other Clinician: Referring Saniyah Mondesir: Treating Wasim Hurlbut/Extender: Gaynelle Cage in Treatment: [place], [name] ([id number]) [id number] Page 3 of 8 Encounter Discharge Information Items Discharge Condition: Stable Ambulatory Status: Ambulatory Discharge Destination: Home Transportation: Private Auto Accompanied By: self Schedule Follow-up Appointment: Yes Clinical Summary of Care: Electronic Signature(s) Signed: 06/22/2022 9:56:03 AM By: Carlene Coria RN Entered By: Carlene Coria on 06/22/2022 09:56:03 -------------------------------------------------------------------------------- Lower Extremity Assessment Details Patient Name: Date of Service: [place], [place] 06/22/2022 9:00 [place] Record Number: [id number] Patient Account Number: [id number] Date of Birth/Sex: Treating RN: March 16, 1951 (71 y.o. Jerilynn Mages) Carlene Coria Primary Care Cola Highfill: Royetta Crochet Other Clinician: Referring Glenola Wheat: Treating Matei Magnone/Extender: Gaynelle Cage in Treatment: 1 Electronic Signature(s) Signed: 06/26/2022 12:08:06 PM By: Carlene Coria RN Entered By: Carlene Coria on 06/22/2022 09:22:38 -------------------------------------------------------------------------------- Multi Wound Chart Details Patient Name: Date  of Service: [place], Jack [facility] 06/22/2022 9:00 A M Medical Record Number: [id number] Patient Account Number: [id number] Date of Birth/Sex: Treating RN: 09-20-1950 (71 y.o. Jerilynn Mages) Carlene Coria Primary Care Buena Boehm: Royetta Crochet Other Clinician: Referring Earline Stiner: Treating Keirstan Iannello/Extender: Gaynelle Cage in Treatment: 1 Vital Signs Height(in): 71 Pulse(bpm): 6 Weight(lbs): 263 Blood Pressure(mmHg): 133/72 Body Mass Index(BMI): 36.7 Temperature(F): 97.6 Respiratory Rate(breaths/min): 14 [1:Photos:] JERRALD, TING ([id number]) [1:Photos:] [N/A:N/A] Right Upper Leg N/A N/A Wound Location: Surgical Injury N/A N/A Wounding Event: Dehisced Wound N/A N/A Primary Etiology: Asthma, Chronic Obstructive N/A N/A Comorbid History: Pulmonary Disease (COPD), Hypertension, Osteoarthritis, Neuropathy 05/22/2022 N/A N/A Date Acquired: 1 N/A N/A Weeks of Treatment: Open N/A N/A Wound Status: No N/A N/A Wound Recurrence: 9x1.6x N/A N/A Measurements L x W x D (cm) 11.31 N/A N/A A (cm) : rea 49.60% N/A N/A % Reduction in Area: Full Thickness With Exposed Support N/A N/A Classification: Structures Medium N/A N/A Exudate Amount: Serosanguineous N/A N/A Exudate Type: red, brown N/A N/A Exudate Color: Flat and Intact N/A N/A Wound Margin: Large (67-100%) N/A N/A Granulation Amount: Red N/A N/A Granulation Quality: Small (1-33%) N/A N/A Necrotic Amount: Fat Layer (Subcutaneous Tissue): Yes N/A N/A Exposed Structures: Muscle: Yes Fascia: No Tendon: No Joint: No Bone: No None N/A N/A Epithelialization: Treatment Notes Electronic Signature(s) Signed: 06/26/2022 12:08:06 PM By: Carlene Coria RN Entered By: Carlene Coria on 06/22/2022 09:22:42 -------------------------------------------------------------------------------- Multi-Disciplinary Care Plan Details Patient Name: Date of Service: [place], Jack [facility] 06/22/2022 9:00 [place] Record Number: [id number] Patient  Account Number: [id number] Date of Birth/Sex: Treating RN: [birth year] (71 y.o. [name] Primary Care Damaria Vachon: Royetta Crochet Other Clinician: Referring Berklee Battey: Treating Jamarrion Budai/Extender: Gaynelle Cage in Treatment: 1 Active Inactive Abuse / Safety / Falls / Self Care Management Nursing Diagnoses: Potential for falls DUPRI, WESTERMAN ([id number]) [phone].pdf Page 5 of 8 Goals: Patient will not experience any injury related to falls Date Initiated: 06/12/2022 Date Inactivated: 06/22/2022 Target Resolution Date: 06/12/2022 Goal Status: Met Patient/caregiver will verbalize understanding of skin care regimen Date Initiated: 06/12/2022 Target Resolution Date: 07/13/2022 Goal Status: Active Interventions: Assess self care needs on admission and as needed Notes: Electronic Signature(s) Signed: 06/26/2022 12:08:06 PM  By: Carlene Coria RN Entered By: Carlene Coria on 06/22/2022 09:23:40 -------------------------------------------------------------------------------- Negative Pressure Wound Therapy Maintenance (NPWT) Details Patient Name: Date of Service: [place], Jack [facility]ard 06/22/2022 9:00 [place] Record Number: [id number] Patient Account Number: [id number] Date of Birth/Sex: Treating RN: [birth year] (72 y.o. Jerilynn Mages) Carlene Coria Primary Care Gamaliel Charney: Royetta Crochet Other Clinician: Referring Yulisa Chirico: Treating Abed Schar/Extender: Gaynelle Cage in Treatment: 1 NPWT Maintenance Performed for: Wound #1 Right Upper Leg Performed By: Carlene Coria, RN Type: Other Coverage Size (sq cm): 14.4 Pressure Type: Constant Pressure Setting: 125 mmHG Drain Type: None Primary Contact: None Sponge/Dressing Type: Foam, Black Date Initiated: 06/12/2022 Dressing Removed: No Quantity of Sponges/Gauze Removed: 1 black Canister Changed: No Dressing Reapplied: No Quantity of Sponges/Gauze Inserted: 1 black Days On NPWT : 11 Post Procedure  Diagnosis Same as Pre-procedure Electronic Signature(s) Signed: 06/22/2022 9:54:57 AM By: Carlene Coria RN Entered By: Carlene Coria on 06/22/2022 09:54:57 [place], [name] ([id number].pdf Page 6 of 8 -------------------------------------------------------------------------------- Pain Assessment Details Patient Name: Date of Service: [place], [place] 06/22/2022 9:00 [place] Record Number: [id number] Patient Account Number: [id number] Date of Birth/Sex: Treating RN: May 31, 1950 (72 y.o. Jerilynn Mages) Carlene Coria Primary Care Rhyan Wolters: Royetta Crochet Other Clinician: Referring Dalayla Aldredge: Treating Shandria Clinch/Extender: Gaynelle Cage in Treatment: 1 Active Problems Location of Pain Severity and Description of Pain Patient Has Paino No Site Locations Pain Management and Medication Current Pain Management: Electronic Signature(s) Signed: 06/26/2022 12:08:06 PM By: Carlene Coria RN Entered By: Carlene Coria on 06/22/2022 09:16:20 -------------------------------------------------------------------------------- Patient/Caregiver Education Details Patient Name: Date of Service: Hilbert Corrigan, [place] 2/5/2024andnbsp9:00 [place] Number: [id number] Patient Account Number: [id number] Date of Birth/Gender: Treating RN: [birth year] (71 y.o. [name] Primary Care Physician: Royetta Crochet Other Clinician: Referring Physician: Treating Physician/Extender: Gaynelle Cage in Treatment: [place], [name] ([id number]) [id number] Page 7 of 8 Education Assessment Education Provided To: Patient Education Topics Provided Wound/Skin Impairment: Methods: Explain/Verbal Responses: State content correctly Electronic Signature(s) Signed: 06/26/2022 12:08:06 PM By: Carlene Coria RN Entered By: Carlene Coria on 06/22/2022 09:22:53 -------------------------------------------------------------------------------- Wound Assessment  Details Patient Name: Date of Service: [place], [place] 06/22/2022 9:00 [place] Record Number: [id number] Patient Account Number: [id number] Date of Birth/Sex: Treating RN: 01/28/1951 (71 y.o. Jerilynn Mages) Carlene Coria Primary Care Mata Rowen: Royetta Crochet Other Clinician: Referring Cherlynn Popiel: Treating Kermitt Harjo/Extender: Gaynelle Cage in Treatment: 1 Wound Status Wound Number: 1 Primary Dehisced Wound Etiology: Wound Location: Right Upper Leg Wound Open Wounding Event: Surgical Injury Status: Date Acquired: 05/22/2022 Comorbid Asthma, Chronic Obstructive Pulmonary Disease (COPD), Weeks Of Treatment: 1 History: Hypertension, Osteoarthritis, Neuropathy Clustered Wound: No Photos Wound Measurements Length: (cm) 9 Width: (cm) 1.6 Depth: (cm) 0.8 Area: (cm) 11.31 Volume: (cm) 9.048 % Reduction in Area: 49.6% % Reduction in Volume: 83.9% Epithelialization: None Tunneling: No Undermining: No Wound Description Classification: Full Thickness With Exposed Support Structures Wound Margin: Flat and Intact Exudate Amount: Medium Exudate Type: Serosanguineous Gastineau, Gerhardt ([id number]) Exudate Color: red, brown Foul Odor After Cleansing: No Slough/Fibrino Yes [phone].pdf Page 8 of 8 Wound Bed Granulation Amount: Large (67-100%) Exposed Structure Granulation Quality: Red Fascia Exposed: No Necrotic Amount: Small (1-33%) Fat Layer (Subcutaneous Tissue) Exposed: Yes Tendon Exposed: No Muscle Exposed: Yes Necrosis of Muscle: Joint Exposed: No Bone Exposed: No Electronic Signature(s) Signed: 06/26/2022 12:08:06 PM By: Carlene Coria RN Entered By: Carlene Coria on 06/22/2022 09:28:10 -------------------------------------------------------------------------------- Vitals Details Patient Name: Date of Service: Jack Blanchard, Jack Blanchard 06/22/2022  9:00 A M Medical Record Number: [id number] Patient Account Number: [id number] Date of Birth/Sex: Treating RN: 1950-06-08  (71 y.o. Jerilynn Mages) Carlene Coria Primary Care Cathe Bilger: Royetta Crochet Other Clinician: Referring Andretta Ergle: Treating Nyaire Denbleyker/Extender: Gaynelle Cage in Treatment: 1 Vital Signs Time Taken: 09:15 Temperature (F): 97.6 Height (in): 71 Pulse (bpm): 51 Weight (lbs): 263 Respiratory Rate (breaths/min): 14 Body Mass Index (BMI): 36.7 Blood Pressure (mmHg): 133/72 Reference Range: 80 - 120 mg / dl Electronic Signature(s) Signed: 06/26/2022 12:08:06 PM By: Carlene Coria RN Entered By: Carlene Coria on 06/22/2022 09:16:14

## 2022-06-26 NOTE — Progress Notes (Signed)
Jack, [facility] ([id number]) [id number] Page 1 of 5 Visit Report for 06/26/2022 Arrival Information Details Patient Name: Date of Service: Jack Blanchard, Jack Blanchard 06/26/2022 11:00 A M Medical Record Number: [id number] Patient Account Number: [id number] Date of Birth/Sex: Treating RN: 1950-07-26 (72 y.o. Jack [facility] Primary Care Abbegayle Denault: Jack Blanchard Other Clinician: Referring Najee Manninen: Treating Ericah Scotto/Extender: Jack Blanchard in Treatment: 2 Visit Information History Since Last Visit Pain Present Now: Yes Patient Arrived: Ambulatory Arrival Time: 11:05 Transfer Assistance: None Patient Identification Verified: Yes Secondary Verification Process Completed: Yes Patient Has Alerts: Yes Patient Alerts: Patient on Blood Thinner 76m aspririn Electronic Signature(s) Signed: 06/26/2022 1:49:05 PM By: WGretta Cool BSN, RN, CWS, Kim RN, BSN Entered By: WGretta Cool BSN, RN, CWS, Kim on 06/26/2022 11:23:44 -------------------------------------------------------------------------------- Clinic Level of Care Assessment Details Patient Name: Date of Service: [name]/01/2023 11:00 A M Medical Record Number: [id number] Account Number: [id number] of Birth/Sex: Treating RN: [birth year](73y.o. MVerl BlalockPrimary Care Mikayela Deats: RRoyetta CrochetOther Clinician: Referring Jack Blanchard: Treating Keevin Panebianco/Extender: SGaynelle Cagein Treatment: 2 Clinic Level of Care Assessment Items TOOL 4 Quantity Score []$  - 0 Use when only an EandM is performed on FOLLOW-UP visit ASSESSMENTS - Nursing Assessment / Reassessment X- 1 10 Reassessment of Co-morbidities (includes updates in patient status) X- 1 5 Reassessment of Adherence to Treatment Plan ASSESSMENTS - Wound and Skin A ssessment / Reassessment X - Simple Wound Assessment / Reassessment - one wound 1 5 []$  - 0 Complex Wound Assessment / Reassessment - multiple wounds WDARREK, ROHLIK([id number]  [phone] Page 2 of 5 []$  - 0 Dermatologic / Skin Assessment (not related to wound area) ASSESSMENTS - Focused Assessment []$  - 0 Circumferential Edema Measurements - multi extremities []$  - 0 Nutritional Assessment / Counseling / Intervention []$  - 0 Lower Extremity Assessment (monofilament, tuning fork, pulses) []$  - 0 Peripheral Arterial Disease Assessment (using hand held doppler) ASSESSMENTS - Ostomy and/or Continence Assessment and Care []$  - 0 Incontinence Assessment and Management []$  - 0 Ostomy Care Assessment and Management (repouching, etc.) PROCESS - Coordination of Care X - Simple Patient / Family Education for ongoing care 1 15 []$  - 0 Complex (extensive) Patient / Family Education for ongoing care X- 1 10 Staff obtains CProgrammer, systems Records, T Results / Process Orders est []$  - 0 Staff telephones HHA, Nursing Homes / Clarify orders / etc []$  - 0 Routine Transfer to another Facility (non-emergent condition) []$  - 0 Routine [facility] Admission (non-emergent condition) []$  - 0 New Admissions / IBiomedical engineer/ Ordering NPWT Apligraf, etc. , []$  - 0 Emergency [facility] Admission (emergent condition) X- 1 10 Simple Discharge Coordination []$  - 0 Complex (extensive) Discharge Coordination PROCESS - Special Needs []$  - 0 Pediatric / Minor Patient Management []$  - 0 Isolation Patient Management []$  - 0 Hearing / Language / Visual special needs []$  - 0 Assessment of Community assistance (transportation, D/C planning, etc.) []$  - 0 Additional assistance / Altered mentation []$  - 0 Support Surface(s) Assessment (bed, cushion, seat, etc.) INTERVENTIONS - Wound Cleansing / Measurement X - Simple Wound Cleansing - one wound 1 5 []$  - 0 Complex Wound Cleansing - multiple wounds X- 1 5 Wound Imaging (photographs - any number of wounds) []$  - 0 Wound Tracing (instead of photographs) X- 1 5 Simple Wound Measurement - one wound []$  - 0 Complex Wound  Measurement - multiple wounds INTERVENTIONS - Wound Dressings []$  - 0 Small Wound Dressing one or multiple wounds  X- 1 15 Medium Wound Dressing one or multiple wounds []$  - 0 Large Wound Dressing one or multiple wounds []$  - 0 Application of Medications - topical []$  - 0 Application of Medications - injection INTERVENTIONS - Miscellaneous []$  - 0 External ear exam []$  - 0 Specimen Collection (cultures, biopsies, blood, body fluids, etc.) []$  - 0 Specimen(s) / Culture(s) sent or taken to Lab for analysis []$  - 0 Patient Transfer (multiple staff / Stormy Fabian / Similar devices) Jack [place], [name] ([id number]) [phone].pdf Page 3 of 5 []$  - 0 Simple Staple / Suture removal (25 or less) []$  - 0 Complex Staple / Suture removal (26 or more) []$  - 0 Hypo / Hyperglycemic Management (close monitor of Blood Glucose) []$  - 0 Ankle / Brachial Index (ABI) - do not check if billed separately X- 1 5 Vital Signs Has the patient been seen at the [facility] within the last three years: Yes Total Score: 90 Level Of Care: New/Established - Level 3 Electronic Signature(s) Signed: 06/26/2022 1:49:05 PM By: Gretta Cool, BSN, RN, CWS, Kim RN, BSN Entered By: Gretta Cool, BSN, RN, CWS, Kim on 06/26/2022 11:43:34 -------------------------------------------------------------------------------- Encounter Discharge Information Details Patient Name: Date of Service: Jack Blanchard 06/26/2022 11:00 A M Medical Record Number: [id number] Patient Account Number: [id number] Date of Birth/Sex: Treating RN: Feb 27, 1951 (72 y.o. Jack [facility] Primary Care Lorane Cousar: Jack Blanchard Other Clinician: Referring Kimoni Pickerill: Treating Kayleah Appleyard/Extender: Jack [facility] in Treatment: 2 Encounter Discharge Information Items Discharge Condition: Stable Ambulatory Status: Ambulatory Discharge Destination: Home Transportation: Private Auto Accompanied By: self Schedule Follow-up Appointment: Yes Clinical Summary  of Care: Electronic Signature(s) Signed: 06/26/2022 1:49:05 PM By: Gretta Cool, BSN, RN, CWS, Kim RN, BSN Entered By: Gretta Cool, BSN, RN, CWS, Kim on 06/26/2022 11:43:02 -------------------------------------------------------------------------------- Wound Assessment Details Patient Name: Date of Service: [place], [place] 06/26/2022 11:00 A M Medical Record Number: [id number] Patient Account Number: [id number] Date of Birth/Sex: Treating RN: 1950-08-01 (72 y.o. Jack [facility] Primary Care Dilan Novosad: Jack [facility] Other Clinician: Referring Britny Riel: Treating Jaquavious Mercer/Extender: Jack [facility] in Treatment: 2 [place], [name] ([id number]) [id number] Page 4 of 5 Wound Status Wound Number: 1 Primary Dehisced Wound Etiology: Wound Location: Right Upper Leg Wound Open Wounding Event: Surgical Injury Status: Date Acquired: 05/22/2022 Comorbid Asthma, Chronic Obstructive Pulmonary Disease (COPD), Weeks Of Treatment: 2 History: Hypertension, Osteoarthritis, Neuropathy Clustered Wound: No Photos Wound Measurements Length: (cm) 8.5 Width: (cm) 1.5 Depth: (cm) 0.8 Area: (cm) 10.014 Volume: (cm) 8.011 % Reduction in Area: 55.4% % Reduction in Volume: 85.7% Epithelialization: None Wound Description Classification: Full Thickness With Exposed Suppor Wound Margin: Flat and Intact Exudate Amount: Medium Exudate Type: Serosanguineous Exudate Color: red, brown t Structures Foul Odor After Cleansing: No Slough/Fibrino No Wound Bed Granulation Amount: Large (67-100%) Exposed Structure Granulation Quality: Red Fascia Exposed: No Necrotic Amount: Small (1-33%) Fat Layer (Subcutaneous Tissue) Exposed: Yes Tendon Exposed: No Muscle Exposed: Yes Necrosis of Muscle: No Joint Exposed: No Bone Exposed: No Assessment Notes Small nodule of unknown source in the base of the wound. See photo below. Treatment Notes Wound #1 (Upper Leg) Wound Laterality:  Right Cleanser Peri-Wound Care Topical Primary Dressing Gauze Discharge Instruction: As directed: Vashe Secondary Dressing ABD Pad 5x9 (in/in) Discharge Instruction: Cover with ABD pad Secured With Medipore T - 46M Medipore H Soft Cloth Surgical T ape ape, 2x2 (in/yd) Compression Wrap Compression Stockings Add-Ons DEVERICK, WARCHOL ([id number]) [phone] Page 5 of 5 Electronic Signature(s) Signed: 06/26/2022 1:49:05 PM By: Gretta Cool, BSN, RN, CWS, Kim RN, BSN  Entered By: Gretta Cool, BSN, RN, CWS, Kim on 06/26/2022 11:39:57

## 2022-06-26 NOTE — Progress Notes (Signed)
HAYATO, LEVI ([id number]) [id number] Page 1 of 2 Visit Report for 06/26/2022 Physician Orders Details Patient Name: Date of Service: [name], [name] 06/26/2022 11:00 A M Medical Record Number: [id number] Patient Account Number: [id number] Date of Birth/Sex: Treating RN: August 15, 1950 (72 y.o. Jack Blanchard Primary Care Provider: Royetta Crochet Other Clinician: Referring Provider: Treating Provider/Extender: Gaynelle Cage in Treatment: 2 Verbal / Phone Orders: No Diagnosis Coding Follow-up Appointments ppointment in 1 week. - Twice weekly for NPWT changes. Tues/Friday or Mon/Thursday depending on appointments with ortho. Return A Nurse Visit as needed Bathing/ Shower/ Hygiene No tub bath. Negative Pressure Wound Therapy Place NPWT on HOLD. - Hold NPWT until visit on Monday 06/29/2022. Wound Treatment Wound #1 - Upper Leg Wound Laterality: Right Prim Dressing: Gauze 1 x Per Day/7 Days ary Discharge Instructions: As directed: Vashe Secondary Dressing: ABD Pad 5x9 (in/in) 1 x Per Day/7 Days Discharge Instructions: Cover with ABD pad Secured With: Medipore T - 95M Medipore H Soft Cloth Surgical T ape ape, 2x2 (in/yd) 1 x Per Day/7 Days Electronic Signature(s) Signed: 06/26/2022 1:49:05 PM By: Gretta Cool, BSN, RN, CWS, Kim RN, BSN Signed: 06/26/2022 2:35:56 PM By: Worthy Keeler PA-C Entered By: Gretta Cool, BSN, RN, CWS, Kim on 06/26/2022 11:42:30 -------------------------------------------------------------------------------- SuperBill Details Patient Name: Date of Service: [name], [name] 06/26/2022 Medical Record Number: [id number] Patient Account Number: [id number] Date of Birth/Sex: Treating RN: 09/04/1950 (72 y.o. 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

## 2022-06-29 ENCOUNTER — Encounter: Payer: 59 | Admitting: Physician Assistant

## 2022-06-29 DIAGNOSIS — T8131XA Disruption of external operation (surgical) wound, not elsewhere classified, initial encounter: Secondary | ICD-10-CM | POA: Diagnosis not present

## 2022-06-29 NOTE — Progress Notes (Addendum)
SHADE, MCCALIP ([id number]) [id number] Page 1 of 7 Visit Report for 06/29/2022 Arrival Information Details Patient Name: Date of Service: [place], [place] 06/29/2022 3:30 PM Medical Blanchard Number: [id number] Patient Account Number: [id number] Date of Birth/Sex: Treating RN: [birth year] (72 y.o. Jack Blanchard Primary Care Sharbel Blanchard: Jack Blanchard Other Clinician: Referring Jack Blanchard: Treating Jack Blanchard/Extender: Jack Blanchard in Treatment: 2 Visit Information History Since Last Visit Added or deleted any medications: No Patient Arrived: Ambulatory Has Dressing in Place as Prescribed: Yes Arrival Time: 15:47 Pain Present Now: No Transfer Assistance: None Patient Identification Verified: Yes Secondary Verification Process Completed: Yes Patient Has Alerts: Yes Patient Alerts: Patient on Blood Thinner 38m aspririn Electronic Signature(s) Signed: 07/03/2022 1:08:07 PM By: Jack Blanchard BSN, RN, CWS, Kim RN, BSN Entered By: Jack Blanchard BSN, RN, CWS, Jack Blanchard on 06/29/2022 15:48:10 -------------------------------------------------------------------------------- Clinic Level of Care Assessment Details Patient Name: Date of Service: [place]20[place] 3:30 PM Medical Blanchard Number: [id number] Account Number: [id number] of Birth/Sex: Treating RN: 8April 01, 1952(72y.o. MVerl BlalockPrimary Care Jack Blanchard: RRoyetta CrochetOther Clinician: Referring Jack Blanchard: Treating Jack Blanchard/Extender: SGaynelle Cagein Treatment: 2 Clinic Level of Care Assessment Items TOOL 4 Quantity Score []$  - 0 Use when only an EandM is performed on FOLLOW-UP visit ASSESSMENTS - Nursing Assessment / Reassessment X- 1 10 Reassessment of Co-morbidities (includes updates in patient status) X- 1 5 Reassessment of Adherence to Treatment Plan ASSESSMENTS - Wound and Skin A ssessment / Reassessment X - Simple Wound Assessment / Reassessment - one wound 1 5 []$  -  0 Complex Wound Assessment / Reassessment - multiple wounds WLADON, Jack Blanchard([id number] Page 2 of 7 []$  - 0 Dermatologic / Skin Assessment (not related to wound area) ASSESSMENTS - Focused Assessment []$  - 0 Circumferential Edema Measurements - multi extremities []$  - 0 Nutritional Assessment / Counseling / Intervention []$  - 0 Lower Extremity Assessment (monofilament, tuning fork, pulses) []$  - 0 Peripheral Arterial Disease Assessment (using hand held doppler) ASSESSMENTS - Ostomy and/or Continence Assessment and Care []$  - 0 Incontinence Assessment and Management []$  - 0 Ostomy Care Assessment and Management (repouching, etc.) PROCESS - Coordination of Care X - Simple Patient / Family Education for ongoing care 1 15 []$  - 0 Complex (extensive) Patient / Family Education for ongoing care X- 1 10 Staff obtains CProgrammer, systems Records, T Results / Process Orders est []$  - 0 Staff telephones HHA, Nursing Homes / Clarify orders / etc []$  - 0 Routine Transfer to another Facility (non-emergent condition) []$  - 0 Routine Hospital Admission (non-emergent condition) []$  - 0 New Admissions / IBiomedical engineer/ Ordering NPWT Apligraf, etc. , []$  - 0 Emergency Hospital Admission (emergent condition) X- 1 10 Simple Discharge Coordination []$  - 0 Complex (extensive) Discharge Coordination PROCESS - Special Needs []$  - 0 Pediatric / Minor Patient Management []$  - 0 Isolation Patient Management []$  - 0 Hearing / Language / Visual special needs []$  - 0 Assessment of Community assistance (transportation, D/C planning, etc.) []$  - 0 Additional assistance / Altered mentation []$  - 0 Support Surface(s) Assessment (bed, cushion, seat, etc.) INTERVENTIONS - Wound Cleansing / Measurement X - Simple Wound Cleansing - one wound 1 5 []$  - 0 Complex Wound Cleansing - multiple wounds X- 1 5 Wound Imaging (photographs - any number of wounds) []$  - 0 Wound Tracing  (instead of photographs) X- 1 5 Simple Wound Measurement - one wound []$  - 0 Complex Wound Measurement - multiple wounds INTERVENTIONS - Wound  Dressings []$  - 0 Small Wound Dressing one or multiple wounds X- 1 15 Medium Wound Dressing one or multiple wounds []$  - 0 Large Wound Dressing one or multiple wounds []$  - 0 Application of Medications - topical []$  - 0 Application of Medications - injection INTERVENTIONS - Miscellaneous []$  - 0 External ear exam []$  - 0 Specimen Collection (cultures, biopsies, blood, body fluids, etc.) []$  - 0 Specimen(s) / Culture(s) sent or taken to Lab for analysis []$  - 0 Patient Transfer (multiple staff / Stormy Fabian / Similar devices) Jack Blanchard, Jack Blanchard ([id number]) [id number] Page 3 of 7 []$  - 0 Simple Staple / Suture removal (25 or less) []$  - 0 Complex Staple / Suture removal (26 or more) []$  - 0 Hypo / Hyperglycemic Management (close monitor of Blood Glucose) []$  - 0 Ankle / Brachial Index (ABI) - do not check if billed separately X- 1 5 Vital Signs Has the patient been seen at the hospital within the last three years: Yes Total Score: 90 Level Of Care: New/Established - Level 3 Electronic Signature(s) Signed: 07/03/2022 1:08:07 PM By: Jack Blanchard, BSN, RN, CWS, Kim RN, BSN Entered By: Jack Blanchard, BSN, RN, CWS, Jack Blanchard on 07/03/2022 09:34:59 -------------------------------------------------------------------------------- Encounter Discharge Information Details Patient Name: Date of Service: Jack Blanchard, Jack Blanchard 06/29/2022 3:30 PM Medical Blanchard Number: [id number] Patient Account Number: [id number] Date of Birth/Sex: Treating RN: June 30, 1950 (72 y.o. Jack Blanchard Primary Care Jack Blanchard: Jack Blanchard Other Clinician: Referring Marchia Diguglielmo: Treating Deward Sebek/Extender: Jack Blanchard in Treatment: 2 Encounter Discharge Information Items Discharge Condition: Stable Ambulatory Status: Ambulatory Discharge Destination:  Home Transportation: Private Auto Schedule Follow-up Appointment: Yes Clinical Summary of Care: Electronic Signature(s) Signed: 07/03/2022 9:43:02 AM By: Jack Blanchard, BSN, RN, CWS, Kim RN, BSN Entered By: Jack Blanchard, BSN, RN, CWS, Jack Blanchard on 07/03/2022 09:43:02 -------------------------------------------------------------------------------- Lower Extremity Assessment Details Patient Name: Date of Service: Jack Blanchard, Jack Blanchard 06/29/2022 3:30 PM Medical Blanchard Number: [id number] Patient Account Number: [id number] Date of Birth/Sex: Treating RN: 04-15-1951 (72 y.o. Jack Blanchard Primary Care Yalena Colon: Jack Blanchard Other Clinician: Referring Dacari Beckstrand: Treating Dareen Gutzwiller/Extender: Jack Blanchard in Treatment: 2 [place], Jack Blanchard ([id number]) [id number] Page 4 of 7 Electronic Signature(s) Signed: 07/03/2022 1:08:07 PM By: Jack Blanchard, BSN, RN, CWS, Kim RN, BSN Entered By: Jack Blanchard, BSN, RN, CWS, Jack Blanchard on 06/29/2022 15:51:27 -------------------------------------------------------------------------------- Multi-Disciplinary Care Plan Details Patient Name: Date of Service: Jack Blanchard, Jack Blanchard 06/29/2022 3:30 PM Medical Blanchard Number: [id number] Patient Account Number: [id number] Date of Birth/Sex: Treating RN: [birth year] (72 y.o. Jack Blanchard Primary Care Daje Stark: Jack Blanchard Other Clinician: Referring Chanta Bauers: Treating Sheray Grist/Extender: Jack Blanchard in Treatment: 2 Active Inactive Abuse / Safety / Falls / Self Care Management Nursing Diagnoses: Potential for falls Goals: Patient will not experience any injury related to falls Date Initiated: 06/12/2022 Date Inactivated: 06/22/2022 Target Resolution Date: 06/12/2022 Goal Status: Met Patient/caregiver will verbalize understanding of skin care regimen Date Initiated: 06/12/2022 Target Resolution Date: 07/13/2022 Goal Status: Active Interventions: Assess self care needs on admission and as needed Notes: Wound/Skin  Impairment Nursing Diagnoses: Impaired tissue integrity Knowledge deficit related to smoking impact on wound healing Knowledge deficit related to ulceration/compromised skin integrity Goals: Patient/caregiver will verbalize understanding of skin care regimen Date Initiated: 06/12/2022 Date Inactivated: 06/22/2022 Target Resolution Date: 06/12/2022 Goal Status: Met Ulcer/skin breakdown will have a volume reduction of 30% by week 4 Date Initiated: 06/12/2022 Target Resolution Date: 07/03/2022 Goal Status: Active Interventions: Assess patient/caregiver ability to obtain necessary supplies Assess patient/caregiver ability to perform ulcer/skin care  regimen upon admission and as needed Assess ulceration(s) every visit Provide education on smoking Provide education on ulcer and skin care Treatment Activities: Patient referred to home care : 06/12/2022 Notes: Jack Blanchard, Jack Blanchard ([id number]) [phone].pdf Page 5 of 7 Electronic Signature(s) Signed: 07/03/2022 9:41:15 AM By: Jack Blanchard, BSN, RN, CWS, Kim RN, BSN Entered By: Jack Blanchard, BSN, RN, CWS, Jack Blanchard on 07/03/2022 09:41:15 -------------------------------------------------------------------------------- Pain Assessment Details Patient Name: Date of Service: Jack Blanchard, Jack Blanchard 06/29/2022 3:30 PM Medical Blanchard Number: [id number] Patient Account Number: [id number] Date of Birth/Sex: Treating RN: May 31, 1950 (72 y.o. Jack Blanchard Primary Care Kary Sugrue: Jack Blanchard Other Clinician: Referring Daoud Lobue: Treating Elleigh Cassetta/Extender: Jack Blanchard in Treatment: 2 Active Problems Location of Pain Severity and Description of Pain Patient Has Paino No Site Locations Pain Management and Medication Current Pain Management: Electronic Signature(s) Signed: 07/03/2022 1:08:07 PM By: Jack Blanchard, BSN, RN, CWS, Kim RN, BSN Entered By: Jack Blanchard, BSN, RN, CWS, Jack Blanchard on 06/29/2022  15:48:43 -------------------------------------------------------------------------------- Patient/Caregiver Education Details Patient Name: Date of Service: Jack Blanchard, Jack Blanchard 2/12/2024andnbsp3:30 PM [place], Jack Blanchard ([id number]) [id number] Page 6 of 7 Medical Blanchard Number: [id number] Patient Account Number: [id number] Date of Birth/Gender: Treating RN: 1950/08/07 (72 y.o. Jack Blanchard Primary Care Physician: Jack Blanchard Other Clinician: Referring Physician: Treating Physician/Extender: Jack Blanchard in Treatment: 2 Education Assessment Education Provided To: Patient Education Topics Provided Wound/Skin Impairment: Handouts: Caring for Your Ulcer Methods: Demonstration, Explain/Verbal Responses: State content correctly Electronic Signature(s) Signed: 07/03/2022 1:08:07 PM By: Jack Blanchard, BSN, RN, CWS, Kim RN, BSN Entered By: Jack Blanchard, BSN, RN, CWS, Jack Blanchard on 07/03/2022 09:40:29 -------------------------------------------------------------------------------- Wound Assessment Details Patient Name: Date of Service: Jack Blanchard, Jack Blanchard 06/29/2022 3:30 PM Medical Blanchard Number: [id number] Patient Account Number: [id number] Date of Birth/Sex: Treating RN: 05-14-1951 (72 y.o. Jack Blanchard Primary Care Hughes Wyndham: Jack Blanchard Other Clinician: Referring Arya Boxley: Treating Burlon Centrella/Extender: Jack Blanchard in Treatment: 2 Wound Status Wound Number: 1 Primary Etiology: Dehisced Wound Wound Location: Right Upper Leg Wound Status: Open Wounding Event: Surgical Injury Date Acquired: 05/22/2022 Weeks Of Treatment: 2 Clustered Wound: No Photos Photo Uploaded By: Jack Blanchard, BSN, RN, CWS, Jack Blanchard on 06/29/2022 15:52:08 Wound Measurements Length: (cm) 7.2 Width: (cm) 1.2 Depth: (cm) 0.4 Area: (cm) 6.786 Jack Blanchard, Jack Blanchard ([id number]) Volume: (cm) 2.714 % Reduction in Area: 69.7% % Reduction in Volume: 95.2% [id number] Page 7 of  7 Wound Description Classification: Full Thickness With Exposed Support Str Exudate Amount: Medium Exudate Type: Serosanguineous Exudate Color: red, brown uctures Treatment Notes Wound #1 (Upper Leg) Wound Laterality: Right Cleanser Peri-Wound Care Topical Primary Dressing Gauze Discharge Instruction: As directed: Vashe Secondary Dressing ABD Pad 5x9 (in/in) Discharge Instruction: Cover with ABD pad Secured With Medipore T - 39M Medipore H Soft Cloth Surgical T ape ape, 2x2 (in/yd) Compression Wrap Compression Stockings Environmental education officer) Signed: 07/03/2022 1:08:07 PM By: Jack Blanchard, BSN, RN, CWS, Kim RN, BSN Entered By: Jack Blanchard, BSN, RN, CWS, Jack Blanchard on 06/29/2022 15:51:17 -------------------------------------------------------------------------------- Vitals Details Patient Name: Date of Service: Jack Blanchard, Jack Blanchard 06/29/2022 3:30 PM Medical Blanchard Number: [id number] Patient Account Number: [id number] Date of Birth/Sex: Treating RN: 07/08/1950 (72 y.o. Jack Blanchard Primary Care Breckin Zafar: Jack Blanchard Other Clinician: Referring Stony Stegmann: Treating Koki Buxton/Extender: Jack Blanchard in Treatment: 2 Vital Signs Time Taken: 15:48 Temperature (F): 97.5 Height (in): 71 Pulse (bpm): 55 Weight (lbs): 263 Respiratory Rate (breaths/min): 16 Body Mass Index (BMI): 36.7 Blood Pressure (mmHg): 113/68 Reference Range: 80 - 120 mg / dl Electronic Signature(s) Signed: 07/03/2022 1:08:07  PM By: Jack Blanchard, BSN, RN, CWS, Kim RN, BSN Entered By: Jack Blanchard, BSN, RN, CWS, Jack Blanchard on 06/29/2022 15:48:34

## 2022-06-30 NOTE — Progress Notes (Addendum)
Jack Blanchard, Jack Blanchard ([id number]) [id number] Page 1 of 6 Visit Report for 06/29/2022 Chief Complaint Document Details Patient Name: Date of Service: [facility], [place] 06/29/2022 3:30 PM Medical Record Number: [id number] Patient Account Number: [id number] Date of Birth/Sex: Treating RN: 11-14-1950 (72 y.o. Jack Blanchard Primary Care Provider: Royetta Crochet Other Clinician: Referring Provider: Treating Provider/Extender: Gaynelle Cage in Treatment: 2 Information Obtained from: Patient Chief Complaint Right hip replacement with surgical wound dehiscence Electronic Signature(s) Signed: 06/29/2022 3:45:55 PM By: Worthy Keeler PA-C Entered By: Worthy Keeler on 06/29/2022 15:45:55 -------------------------------------------------------------------------------- HPI Details Patient Name: Date of Service: [place], [place] 06/29/2022 3:30 PM Medical Record Number: [id number] Patient Account Number: [id number] Date of Birth/Sex: Treating RN: 28-Mar-1951 (72 y.o. Jack Blanchard Primary Care Provider: Royetta Crochet Other Clinician: Referring Provider: Treating Provider/Extender: Gaynelle Cage in Treatment: 2 History of Present Illness HPI Description: 06-12-2022 upon evaluation today patient presents for initial inspection here in the clinic concerning issues that he has been having with a wound currently on the right upper leg location. 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 With that being said there is definitely a deep enough area to support muscle exposure although fortunately I do not think there is any structure that is going to be covered with any type of contact later this is good news. He has a lot of debridement the needs to happen as far as some adherent slough and necrotic tissue also appears to have a seroma pocket which I found upon inspection of the wound today as well. Overall I think that if we manage  these issues and continue with the wound VAC appropriately placed and keeping it in place this should help this area to heal effectively and quickly. I discussed that with the patient today as well. And he is happy to proceed with the plan. Nonetheless this is a wound that initially occurred on January 5 when the patient had a total hip replacement. Since that time he tells me that he has had the wound VAC since pretty much the surgery initially I think it was being used to try to help with the surgical incision site and then once a day he has they have been packing this and performing the dressing changes at his orthopedic office. 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 06-22-2022 upon evaluation today patient appears to be doing well currently in regard to his wound other than the fact that there was some odor according to the nurse after removing the wound VAC. I am a little concerned about the possibility of infection and for that reason I am going to go ahead and obtain a wound culture. Jack Blanchard, Jack Blanchard ([id number]) [id number] Page 2 of 6 06-29-2022 upon evaluation today patient's wound is actually showing signs of excellent improvement. I am very pleased with where things stand we are actually doing a Dakin's wet-to-dry dressing and he seems to have done extremely well for him and overall I am extremely pleased at this point. I do think that since he is doing so well without the wound VAC there is really no need to reinitiate that in fact the wound is significantly smaller at this point. Electronic Signature(s) Signed: 06/29/2022 5:14:53 PM By: Worthy Keeler PA-C  Entered By: Worthy Keeler on 06/29/2022  17:14:52 -------------------------------------------------------------------------------- Physical Exam Details Patient Name: Date of Service: [place], [place] 06/29/2022 3:30 PM Medical Record Number: [id number] Patient Account Number: [id number] Date of Birth/Sex: Treating RN: [birth year] (73 y.o. Jack Blanchard Primary Care Provider: Royetta Crochet Other Clinician: Referring Provider: Treating Provider/Extender: Gaynelle Cage in Treatment: 2 Constitutional Obese and well-hydrated in no acute distress. Respiratory normal breathing without difficulty. Psychiatric this patient is able to make decisions and demonstrates good insight into disease process. Alert and Oriented x 3. pleasant and cooperative. Notes Upon inspection patient's wound bed actually showed signs of significant improvement I am actually very pleased with where we stand today and I think that he is making excellent progress. I do believe that we can discontinue the wound VAC at this point he is in agreement with this plan he is actually very happy to hear this. Electronic Signature(s) Signed: 06/29/2022 5:15:09 PM By: Worthy Keeler PA-C Entered By: Worthy Keeler on 06/29/2022 17:15:09 -------------------------------------------------------------------------------- Physician Orders Details Patient Name: Date of Service: [place], [place] 06/29/2022 3:30 PM Medical Record Number: [id number] Patient Account Number: [id number] Date of Birth/Sex: Treating RN: 06/13/1950 (72 y.o. Jack Blanchard Primary Care Provider: Royetta Crochet Other Clinician: Referring Provider: Treating Provider/Extender: Gaynelle Cage in Treatment: 2 Verbal / Phone Orders: No Diagnosis Jack Blanchard, Jack Blanchard ([id number]) [id number] Page 3 of 6 ICD-10 Coding Code Description T81.31XA Disruption of external operation (surgical) wound, not elsewhere classified, initial encounter L98.492  Non-pressure chronic ulcer of skin of other sites with fat layer exposed Z96.641 Presence of right artificial hip joint I10 Essential (primary) hypertension Follow-up Appointments Return Appointment in 1 week. Nurse Visit as needed Bathing/ Shower/ Hygiene No tub bath. Negative Pressure Wound Therapy Discontinue NPWT. - Patient to return to vendor. Wound Treatment Wound #1 - Upper Leg Wound Laterality: Right Prim Dressing: Gauze 1 x Per Day/7 Days ary Discharge Instructions: As directed: Vashe Secondary Dressing: ABD Pad 5x9 (in/in) 1 x Per Day/7 Days Discharge Instructions: Cover with ABD pad Secured With: Medipore T - 38M Medipore H Soft Cloth Surgical T ape ape, 2x2 (in/yd) 1 x Per Day/7 Days Electronic Signature(s) Signed: 07/03/2022 1:08:07 PM By: Gretta Cool, BSN, RN, CWS, Kim RN, BSN Signed: 07/03/2022 1:17:52 PM By: Worthy Keeler PA-C Previous Signature: 06/29/2022 5:15:39 PM Version By: Worthy Keeler PA-C Entered By: Gretta Cool BSN, RN, CWS, Kim on 07/03/2022 09:34:30 -------------------------------------------------------------------------------- Problem List Details Patient Name: Date of Service: [place], [place] 06/29/2022 3:30 PM Medical Record Number: [id number] Patient Account Number: [id number] Date of Birth/Sex: Treating RN: 1951/02/23 (72 y.o. Jack Blanchard Primary Care Provider: Royetta Crochet Other Clinician: Referring Provider: Treating Provider/Extender: Gaynelle Cage in Treatment: 2 Active Problems ICD-10 Encounter Code Description Active Date MDM Diagnosis T81.31XA Disruption of external operation (surgical) wound, not elsewhere classified, 06/12/2022 No Yes initial encounter L98.492 Non-pressure chronic ulcer of skin of other sites with fat layer exposed 06/12/2022 No Yes Z96.641 Presence of right artificial hip joint 06/12/2022 No Yes Jack Blanchard, Jack Blanchard ([id number]) [phone].pdf Page 4 of 6 I10 Essential (primary) hypertension  06/12/2022 No Yes Inactive Problems Resolved Problems Electronic Signature(s) Signed: 06/29/2022 3:45:50 PM By: Worthy Keeler PA-C Entered By: Worthy Keeler on 06/29/2022 15:45:49 -------------------------------------------------------------------------------- Progress Note Details Patient Name: Date of Service: [place], [place] 06/29/2022 3:30 PM Medical Record Number: [id number] Patient Account Number: [id number] Date of Birth/Sex: Treating RN: 02-10-1951 (72 y.o. Jack Blanchard,  [place] Primary Care Provider: Royetta Crochet Other Clinician: Referring Provider: Treating Provider/Extender: Gaynelle Cage in Treatment: 2 Subjective Chief Complaint Information obtained from Patient Right hip replacement with surgical wound dehiscence History of Present Illness (HPI) 06-12-2022 upon evaluation today patient presents for initial inspection here in the clinic concerning issues that he has been having with a wound currently on the right upper leg location. 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 With that being said there is definitely a deep enough area to support muscle exposure although fortunately I do not think there is any structure that is going to be covered with any type of contact later this is good news. He has a lot of debridement the needs to happen as far as some adherent slough and necrotic tissue also appears to have a seroma pocket which I found upon inspection of the wound today as well. Overall I think that if we manage these issues and continue with the wound VAC appropriately placed and keeping it in place this should help this area to heal effectively and quickly. I discussed that with the patient today as well. And he is happy to proceed with the plan. Nonetheless this is a wound that initially occurred on January 5 when the patient had a total hip replacement. Since that time he tells me that he has had the wound VAC since pretty much  the surgery initially I think it was being used to try to help with the surgical incision site and then once a day he has they have been packing this and performing the dressing changes at his orthopedic office. 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 06-22-2022 upon evaluation today patient appears to be doing well currently in regard to his wound other than the fact that there was some odor according to the nurse after removing the wound VAC. I am a little concerned about the possibility of infection and for that reason I am going to go ahead and obtain a wound culture. 06-29-2022 upon evaluation today patient's wound is actually showing signs of excellent improvement. I am very pleased with where things stand we are actually doing a Dakin's wet-to-dry dressing and he seems to have done extremely well for him and overall I am extremely pleased at this point. I do think that since he is doing so well without the wound VAC there is really no need to reinitiate that in fact the wound is significantly smaller at this point. Objective Constitutional Obese and well-hydrated in no acute distress. Vitals Time Taken: 3:48 PM, Height: 71 in, Weight: 263 lbs, BMI: 36.7, Temperature: 97.5 F, Pulse: 55 bpm, Respiratory Rate: 16 breaths/min, Blood Pressure: 113/68 mmHg. Jack Blanchard, Jack Blanchard ([id number]) [id number] Page 5 of 6 Respiratory normal breathing without difficulty. Psychiatric this patient is able to make decisions and demonstrates good insight into disease process. Alert and Oriented x 3. pleasant and cooperative. General Notes: Upon inspection patient's wound bed actually showed signs of significant improvement I am actually very pleased with where we stand today and I think that he is  making excellent progress. I do believe that we can discontinue the wound VAC at this point he is in agreement with this plan he is actually very happy to hear this. Integumentary (Hair, Skin) Wound #  1 status is Open. Original cause of wound was Surgical Injury. The date acquired was: 05/22/2022. The wound has been in treatment 2 weeks. The wound is located on the Right Upper Leg. The wound measures 7.2cm length x 1.2cm width x 0.4cm depth; 6.786cm^2 area and 2.714cm^3 volume. There is a medium amount of serosanguineous drainage noted. 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 - Twice weekly for NPWT changes. Tues/Friday or Mon/Thursday depending on appointments with ortho. Nurse Visit as needed Bathing/ Shower/ Hygiene: No tub bath. Negative Pressure Wound Therapy: Place NPWT on HOLD. - Hold NPWT until visit on Monday 06/29/2022. WOUND #1: - Upper Leg Wound Laterality: Right Prim Dressing: Gauze 1 x Per Day/7 Days ary Discharge Instructions: As directed: Vashe Secondary Dressing: ABD Pad 5x9 (in/in) 1 x Per Day/7 Days Discharge Instructions: Cover with ABD pad Secured With: Medipore T - 41M Medipore H Soft Cloth Surgical T ape ape, 2x2 (in/yd) 1 x Per Day/7 Days 1. I am going to recommend currently that we have the patient continue to monitor for any signs of infection or worsening. Right now based on what I am seeing I do believe that we can continue with the Vashe or Dakin's wet-to-dry dressing daily which seems to be doing really well for him. 2. I am also can recommend that the patient continue to monitor for any signs of infection or worsening. Obviously if anything changes he knows in contact the  office let me know otherwise were just going to continue with the plan as before and we will see how things appear next week. We will see patient back for reevaluation in 1 week here in the clinic. If anything worsens or changes patient will contact our office for additional recommendations. Electronic Signature(s) Signed: 06/29/2022 5:15:50 PM By: Worthy Keeler PA-C Entered By: Worthy Keeler on 06/29/2022 17:15:50 -------------------------------------------------------------------------------- SuperBill Details Patient Name: Date of Service: [address] [place] 06/29/2022 Jack Blanchard ([id number] Page 6 of 6 Medical Record Number: [id number] Patient Account Number: [id number] Date of Birth/Sex: Treating RN: 12/09/1950 (72 y.o. Jack Blanchard Primary Care Provider: Royetta Crochet Other Clinician: Referring Provider: Treating Provider/Extender: Gaynelle Cage in Treatment: 2 Diagnosis Coding ICD-10 Codes Code Description T81.31XA Disruption of external operation (surgical) wound, not elsewhere classified, initial encounter L98.492 Non-pressure chronic ulcer of skin of other sites with fat layer exposed Z96.641 Presence of right artificial hip joint I10 Essential (primary) hypertension Facility Procedures : CPT4 Code: [id number] Description: 99213 - WOUND CARE VISIT-LEV 3 EST PT Modifier: Quantity: 1 Physician Procedures : CPT4 Code Description Modifier [id number] - WC PHYS LEVEL 3 - EST PT ICD-10 Diagnosis Description T81.31XA Disruption of external operation (surgical) wound, not elsewhere classified, initial encounter L98.492 Non-pressure chronic ulcer of skin of  other sites with fat layer exposed Z96.641 Presence of right artificial hip joint I10 Essential (primary) hypertension Quantity: 1 Electronic Signature(s) Signed: 07/03/2022 9:35:08 AM By: Gretta Cool, BSN, RN, CWS, Kim RN, BSN Signed: 07/03/2022 1:17:52 PM By: Worthy Keeler  PA-C Previous Signature: 06/29/2022 5:16:08 PM Version By: Worthy Keeler PA-C Entered By: Gretta Cool, BSN, RN, CWS, Kim on 07/03/2022 09:35:08

## 2022-07-06 ENCOUNTER — Encounter: Payer: 59 | Admitting: Physician Assistant

## 2022-07-06 DIAGNOSIS — T8131XA Disruption of external operation (surgical) wound, not elsewhere classified, initial encounter: Secondary | ICD-10-CM | POA: Diagnosis not present

## 2022-07-06 NOTE — Progress Notes (Signed)
CHARVEZ, PRIVOTT ([id number]) [id number] Page 1 of 5 Visit Report for 07/06/2022 Arrival Information Details Patient Name: Date of Service: [place], [place] [facility] 07/06/2022 7:30 A M Medical Record Number: [id number] Patient Account Number: [id number] Date of Birth/Sex: Treating RN: 1951/02/04 (72 y.o. [name] Primary Care Daya Dutt: Royetta Crochet Other Clinician: Referring Korbyn Vanes: Treating Marly Schuld/Extender: Gaynelle Cage in Treatment: 3 Visit Information History Since Last Visit Added or deleted any medications: No Patient Arrived: Ambulatory Has Dressing in Place as Prescribed: Yes Arrival Time: 07:56 Pain Present Now: No Accompanied By: self Transfer Assistance: None Patient Identification Verified: Yes Secondary Verification Process Completed: Yes Patient Has Alerts: Yes Patient Alerts: Patient on Blood Thinner 76m aspririn Electronic Signature(s) Signed: 07/06/2022 1:35:06 PM By: WGretta Cool BSN, RN, CWS, Kim RN, BSN Entered By: WGretta Cool BSN, RN, CWS, Kim on 07/06/2022 07:56:56 -------------------------------------------------------------------------------- Lower Extremity Assessment Details Patient Name: [place] 7:30 A M Medical Record Number: [id number] Account Number: [id number] of Birth/Sex: Treating RN: [birth year](72y.o. MVerl BlalockPrimary Care Bennet Kujawa: RRoyetta CrochetOther Clinician: Referring Honey Zakarian: Treating Hattie Pine/Extender: SGaynelle Cagein Treatment: 3 Electronic Signature(s) Signed: 07/06/2022 1:35:06 PM By: WGretta Cool BSN, RN, CWS, Kim RN, BSN Entered By: WGretta Cool BSN, RN, CWS, Kim on 07/06/2022 08:05:21 WLynita Lombard([id number] Page 2 of 5 -------------------------------------------------------------------------------- Multi Wound Chart Details Patient Name: [place] 07/06/2022 7:30 A M Medical Record  Number: [id number] Account Number: [id number] of Birth/Sex: Treating RN: [birth year](72y.o. MVerl BlalockPrimary Care Jeydi Klingel: RRoyetta CrochetOther Clinician: Referring Fumie Fiallo: Treating Ramir Malerba/Extender: SGaynelle Cagein Treatment: 3 Vital Signs Height(in): 71 Pulse(bpm): 60 Weight(lbs): [id number] Pressure(mmHg): 155/72 Body Mass Index(BMI): 36.7 Temperature(F): 97.5 Respiratory Rate(breaths/min): 16 [1:Photos:] [N/A:N/A] Right Upper Leg N/A N/A Wound Location: Surgical Injury N/A N/A Wounding Event: Dehisced Wound N/A N/A Primary Etiology: Asthma, Chronic Obstructive N/A N/A Comorbid History: Pulmonary Disease (COPD), Hypertension, Osteoarthritis, Neuropathy 05/22/2022 N/A N/A Date Acquired: 3 N/A N/A Weeks of Treatment: Open N/A N/A Wound Status: No N/A N/A Wound Recurrence: 6x0.9x0.2 N/A N/A Measurements L x W x D (cm) 4.241 N/A N/A A (cm) : rea 0.848 N/A N/A Volume (cm) : 81.10% N/A N/A % Reduction in Area: 98.50% N/A N/A % Reduction in Volume: Full Thickness With Exposed Support N/A N/A Classification: Structures Medium N/A N/A Exudate A mount: Serosanguineous N/A N/A Exudate Type: red, brown N/A N/A Exudate Color: Large (67-100%) N/A N/A Granulation A mount: Red, Hyper-granulation N/A N/A Granulation Quality: Small (1-33%) N/A N/A Necrotic A mount: Fascia: Yes N/A N/A Exposed Structures: Small (1-33%) N/A N/A Epithelialization: Treatment Notes Electronic Signature(s) Signed: 07/06/2022 1:35:06 PM By: WGretta Cool BSN, RN, CWS, Kim RN, BSN Entered By: WGretta Cool BSN, RN, CWS, Kim on 07/06/2022 08:06:43 WLynita Lombard([id number] Page 3 of 5 -------------------------------------------------------------------------------- Pain Assessment Details Patient Name: [place] 07/06/2022 7:30 A M Medical Record Number: [id number] Account Number:  [id number] of Birth/Sex: Treating RN: 821-Jul-1952(72y.o. MVerl BlalockPrimary Care Mamoudou Mulvehill: RRoyetta CrochetOther Clinician: Referring Yariana Hoaglund: Treating Vicktoria Muckey/Extender: SGaynelle Cagein Treatment: 3 Active Problems Location of Pain Severity and Description of Pain Patient Has Paino No Site Locations Pain Management and Medication Current Pain Management: Notes Patient denies pain at this time. Electronic Signature(s) Signed: 07/06/2022 1:35:06 PM By: WGretta Cool BSN, RN, CWS, Kim RN, BSN Entered By: WGretta Cool BSN, RN, CWS,  Kim on 07/06/2022 07:57:54 -------------------------------------------------------------------------------- Wound Assessment Details Patient Name: Date of Service: [place], [place] [facility] 07/06/2022 7:30 A M Medical Record Number: [id number] Patient Account Number: [id number] Date of Birth/Sex: Treating RN: 16-Apr-1951 (72 y.o. [name] Primary Care Seham Gardenhire: Royetta Crochet Other Clinician: [name] ([id number]) [id number] Page 4 of 5 Referring Athalee Esterline: Treating Shaneice Barsanti/Extender: Gaynelle Cage in Treatment: 3 Wound Status Wound Number: 1 Primary Dehisced Wound Etiology: Wound Location: Right Upper Leg Wound Open Wounding Event: Surgical Injury Status: Date Acquired: 05/22/2022 Comorbid Asthma, Chronic Obstructive Pulmonary Disease (COPD), Weeks Of Treatment: 3 History: Hypertension, Osteoarthritis, Neuropathy Clustered Wound: No Photos Wound Measurements Length: (cm) Width: (cm) Depth: (cm) Area: (cm) Volume: (cm) 6 % Reduction in Area: 81.1% 0.9 % Reduction in Volume: 98.5% 0.2 Epithelialization: Small (1-33%) 4.241 Tunneling: No 0.848 Undermining: No Wound Description Classification: Full Thickness With Exposed Suppo Exudate Amount: Medium Exudate Type: Serosanguineous Exudate Color: red, brown rt Structures Slough/Fibrino Yes Wound Bed Granulation Amount: Large (67-100%)  Exposed Structure Granulation Quality: Red, Hyper-granulation Fascia Exposed: Yes Necrotic Amount: Small (1-33%) Necrotic Quality: Adherent Therapist, music) Signed: 07/06/2022 1:35:06 PM By: Gretta Cool, BSN, RN, CWS, Kim RN, BSN Entered By: Gretta Cool, BSN, RN, CWS, Kim on 07/06/2022 08:05:05 -------------------------------------------------------------------------------- Vitals Details Patient Name: Date of Service: [name] 07/06/2022 7:30 A M Medical Record Number: [id number] Patient Account Number: [id number] Date of Birth/Sex: Treating RN: [birth year] (72 y.o. [name] Primary Care Liyanna Cartwright: Royetta Crochet Other Clinician: Referring Victorina Kable: Treating La Shehan/Extender: Gaynelle Cage in Treatment: 3 Vital Signs Stehr, [name] ([id number]) [id number] Page 5 of 5 Time Taken: 07:56 Temperature (F): 97.5 Height (in): 71 Pulse (bpm): 60 Weight (lbs): 263 Respiratory Rate (breaths/min): 16 Body Mass Index (BMI): 36.7 Blood Pressure (mmHg): 155/72 Reference Range: 80 - 120 mg / dl Notes Patient states he has not taken BP medication this morning. Electronic Signature(s) Signed: 07/06/2022 1:35:06 PM By: Gretta Cool, BSN, RN, CWS, Kim RN, BSN Entered By: Gretta Cool, BSN, RN, CWS, Kim on 07/06/2022 07:57:30

## 2022-07-13 ENCOUNTER — Encounter: Payer: 59 | Admitting: Physician Assistant

## 2022-07-13 DIAGNOSIS — T8131XA Disruption of external operation (surgical) wound, not elsewhere classified, initial encounter: Secondary | ICD-10-CM | POA: Diagnosis not present

## 2022-07-14 NOTE — Progress Notes (Signed)
SADRAC, SALATA ([id number]) [id number] Page 1 of 8 Visit Report for 07/13/2022 Arrival Information Details Patient Name: Date of Service: [place], Jack Blanchard 07/13/2022 10:15 A M Medical Record Number: [id number] Patient Account Number: [id number] Date of Birth/Sex: Treating RN: 1951-04-20 (72 y.o. Jack Blanchard) Carlene Coria Primary Care Earleen Aoun: Royetta Crochet Other Clinician: Referring Arthi Mcdonald: Treating Colvin Blatt/Extender: Gaynelle Cage in Treatment: 4 Visit Information History Since Last Visit Added or deleted any medications: No Patient Arrived: Ambulatory Any new allergies or adverse reactions: No Arrival Time: 10:23 Had a fall or experienced change in No Accompanied By: self activities of daily living that may affect Transfer Assistance: None risk of falls: Patient Identification Verified: Yes Signs or symptoms of abuse/neglect since last visito No Secondary Verification Process Completed: Yes Hospitalized since last visit: No Patient Has Alerts: Yes Implantable device outside of the clinic excluding No Patient Alerts: Patient on Blood Thinner cellular tissue based products placed in the center '81mg'$  aspririn since last visit: Has Dressing in Place as Prescribed: Yes Pain Present Now: No Electronic Signature(s) Signed: 07/13/2022 4:28:51 PM By: Carlene Coria RN Entered By: Carlene Coria on 07/13/2022 10:23:26 -------------------------------------------------------------------------------- Clinic Level of Care Assessment Details Patient Name: Date of Service: Jack Blanchard 07/13/2022 10:15 A M Medical Record Number: [id number] Patient Account Number: [id number] Date of Birth/Sex: Treating RN: Aug 26, 1950 (71 y.o. Jack Blanchard) Carlene Coria Primary Care Tykisha Areola: Royetta Crochet Other Clinician: Referring Meegan Shanafelt: Treating Lezly Rumpf/Extender: Gaynelle Cage in Treatment: 4 Clinic Level of Care Assessment Items TOOL 1 Quantity Score '[]'$  - 0 Use  when EandM and Procedure is performed on INITIAL visit ASSESSMENTS - Nursing Assessment / Reassessment '[]'$  - 0 General Physical Exam (combine w/ comprehensive assessment (listed just below) when performed on new pt. evals) '[]'$  - 0 Comprehensive Assessment (HX, ROS, Risk Assessments, Wounds Hx, etc.) Dottavio, [name] ([id number]) [id number] Page 2 of 8 ASSESSMENTS - Wound and Skin Assessment / Reassessment '[]'$  - 0 Dermatologic / Skin Assessment (not related to wound area) ASSESSMENTS - Ostomy and/or Continence Assessment and Care '[]'$  - 0 Incontinence Assessment and Management '[]'$  - 0 Ostomy Care Assessment and Management (repouching, etc.) PROCESS - Coordination of Care '[]'$  - 0 Simple Patient / Family Education for ongoing care '[]'$  - 0 Complex (extensive) Patient / Family Education for ongoing care '[]'$  - 0 Staff obtains Programmer, systems, Records, T Results / Process Orders est '[]'$  - 0 Staff telephones HHA, Nursing Homes / Clarify orders / etc '[]'$  - 0 Routine Transfer to another Facility (non-emergent condition) '[]'$  - 0 Routine Hospital Admission (non-emergent condition) '[]'$  - 0 New Admissions / Biomedical engineer / Ordering NPWT Apligraf, etc. , '[]'$  - 0 Emergency Hospital Admission (emergent condition) PROCESS - Special Needs '[]'$  - 0 Pediatric / Minor Patient Management '[]'$  - 0 Isolation Patient Management '[]'$  - 0 Hearing / Language / Visual special needs '[]'$  - 0 Assessment of Community assistance (transportation, D/C planning, etc.) '[]'$  - 0 Additional assistance / Altered mentation '[]'$  - 0 Support Surface(s) Assessment (bed, cushion, seat, etc.) INTERVENTIONS - Miscellaneous '[]'$  - 0 External ear exam '[]'$  - 0 Patient Transfer (multiple staff / Civil Service fast streamer / Similar devices) '[]'$  - 0 Simple Staple / Suture removal (25 or less) '[]'$  - 0 Complex Staple / Suture removal (26 or more) '[]'$  - 0 Hypo/Hyperglycemic Management (do not check if billed separately) '[]'$  - 0 Ankle /  Brachial Index (ABI) - do not check if billed separately Has the patient been seen at the hospital within  the last three years: Yes Total Score: 0 Level Of Care: ____ Electronic Signature(s) Signed: 07/13/2022 4:28:51 PM By: Carlene Coria RN Entered By: Carlene Coria on 07/13/2022 10:53:59 -------------------------------------------------------------------------------- Encounter Discharge Information Details Patient Name: Date of Service: Jack Blanchard 07/13/2022 10:15 A M Medical Record Number: [id number] Patient Account Number: [id number] Date of Birth/Sex: Treating RN: February 19, 1951 (71 y.o. [name] Primary Care Faithlynn Deeley: Royetta Crochet Other Clinician: Referring Armistead Sult: Treating Dee Paden/Extender: Gaynelle Cage in Treatment: [place], [name] ([id number]) [id number].pdf Page 3 of 8 Encounter Discharge Information Items Discharge Condition: Stable Ambulatory Status: Ambulatory Discharge Destination: Home Transportation: Private Auto Accompanied By: self Schedule Follow-up Appointment: Yes Clinical Summary of Care: Electronic Signature(s) Signed: 07/13/2022 4:28:51 PM By: Carlene Coria RN Entered By: Carlene Coria on 07/13/2022 10:56:28 -------------------------------------------------------------------------------- Lower Extremity Assessment Details Patient Name: Date of Service: Jack Blanchard 07/13/2022 10:15 A M Medical Record Number: [id number] Patient Account Number: [id number] Date of Birth/Sex: Treating RN: Aug 27, 1950 (71 y.o. Jack Blanchard) Carlene Coria Primary Care Toshiko Kemler: Royetta Crochet Other Clinician: Referring Naira Standiford: Treating Olivia Royse/Extender: Gaynelle Cage in Treatment: 4 Electronic Signature(s) Signed: 07/13/2022 4:28:51 PM By: Carlene Coria RN Entered By: Carlene Coria on 07/13/2022 10:28:54 -------------------------------------------------------------------------------- Multi Wound Chart Details Patient Name:  Date of Service: [place], [place] 07/13/2022 10:15 A M Medical Record Number: [id number] Patient Account Number: [id number] Date of Birth/Sex: Treating RN: March 17, 1951 (71 y.o. Jack Blanchard) Carlene Coria Primary Care Sydney Hasten: Royetta Crochet Other Clinician: Referring Jaleil Renwick: Treating Shilynn Hoch/Extender: Gaynelle Cage in Treatment: 4 Vital Signs Height(in): 71 Pulse(bpm): 54 Weight(lbs): 263 Blood Pressure(mmHg): 144/76 Body Mass Index(BMI): 36.7 Temperature(F): 97.6 Respiratory Rate(breaths/min): 18 [1:Photos:] CARTHEL, JANNUSCH ([id number]) [1:Photos:] [N/A:N/A] Right Upper Leg N/A N/A Wound Location: Surgical Injury N/A N/A Wounding Event: Dehisced Wound N/A N/A Primary Etiology: Asthma, Chronic Obstructive N/A N/A Comorbid History: Pulmonary Disease (COPD), Hypertension, Osteoarthritis, Neuropathy 05/22/2022 N/A N/A Date Acquired: 4 N/A N/A Weeks of Treatment: Open N/A N/A Wound Status: No N/A N/A Wound Recurrence: 2x0.5x0.1 N/A N/A Measurements L x W x D (cm) 0.785 N/A N/A A (cm) : rea 0.079 N/A N/A Volume (cm) : 96.50% N/A N/A % Reduction in Area: 99.90% N/A N/A % Reduction in Volume: Full Thickness With Exposed Support N/A N/A Classification: Structures Medium N/A N/A Exudate A mount: Serosanguineous N/A N/A Exudate Type: red, brown N/A N/A Exudate Color: Large (67-100%) N/A N/A Granulation A mount: Red, Hyper-granulation N/A N/A Granulation Quality: None Present (0%) N/A N/A Necrotic A mount: Fascia: Yes N/A N/A Exposed Structures: Small (1-33%) N/A N/A Epithelialization: Treatment Notes Electronic Signature(s) Signed: 07/13/2022 4:28:51 PM By: Carlene Coria RN Entered By: Carlene Coria on 07/13/2022 10:29:00 -------------------------------------------------------------------------------- Multi-Disciplinary Care Plan Details Patient Name: Date of Service: [place], [place] Blanchard 07/13/2022 10:15 A M Medical Record Number: [id number] Patient Account  Number: [id number] Date of Birth/Sex: Treating RN: 1951/01/06 (71 y.o. [name] Primary Care Lessa Huge: Royetta Crochet Other Clinician: Referring Angelly Spearing: Treating Bradford Cazier/Extender: Gaynelle Cage in Treatment: 4 Active Inactive Abuse / Safety / Falls / Self Care Management Nursing Diagnoses: Potential for falls Goals: Patient will not experience any injury related to falls Date Initiated: 06/12/2022 Date Inactivated: 06/22/2022 Target Resolution Date: 06/12/2022 Goal Status: SHADRICK, BIERLEY ([id number]) [id number] Page 5 of 8 Patient/caregiver will verbalize understanding of skin care regimen Date Initiated: 06/12/2022 Target Resolution Date: 08/11/2022 Goal Status: Active Interventions: Assess self care needs on admission and as needed Notes: Electronic Signature(s) Signed: 07/13/2022 4:28:51 PM By:  Carlene Coria RN Entered By: Carlene Coria on 07/13/2022 10:29:20 -------------------------------------------------------------------------------- Pain Assessment Details Patient Name: Date of Service: [place], [place] 07/13/2022 10:15 A M Medical Record Number: [id number] Patient Account Number: [id number] Date of Birth/Sex: Treating RN: [birth year] (72 y.o. Jack Blanchard) Carlene Coria Primary Care Londell Noll: Royetta Crochet Other Clinician: Referring Tag Wurtz: Treating Liani Caris/Extender: Gaynelle Cage in Treatment: 4 Active Problems Location of Pain Severity and Description of Pain Patient Has Paino No Site Locations Pain Management and Medication Current Pain Management: Electronic Signature(s) Signed: 07/13/2022 4:28:51 PM By: Carlene Coria RN Entered By: Carlene Coria on 07/13/2022 10:24:12 [name] ([id number]) [id number] Page 6 of 8 -------------------------------------------------------------------------------- Patient/Caregiver Education Details Patient Name: Date of Service: [place], [place]  2/26/2024andnbsp10:15 [place] Number: [id number] Patient Account Number: [id number] Date of Birth/Gender: Treating RN: 05-Feb-1951 (72 y.o. Jack Blanchard) Carlene Coria Primary Care Physician: Royetta Crochet Other Clinician: Referring Physician: Treating Physician/Extender: Gaynelle Cage in Treatment: 4 Education Assessment Education Provided To: Patient Education Topics Provided Wound/Skin Impairment: Methods: Explain/Verbal Responses: State content correctly Electronic Signature(s) Signed: 07/13/2022 4:28:51 PM By: Carlene Coria RN Entered By: Carlene Coria on 07/13/2022 10:29:30 -------------------------------------------------------------------------------- Wound Assessment Details Patient Name: Date of Service: [place], [place] 07/13/2022 10:15 A M Medical Record Number: [id number] Patient Account Number: [id number] Date of Birth/Sex: Treating RN: 1950/07/14 (71 y.o. Jack Blanchard) Carlene Coria Primary Care Kyliee Ortego: Royetta Crochet Other Clinician: Referring Caliber Landess: Treating Derion Kreiter/Extender: Gaynelle Cage in Treatment: 4 Wound Status Wound Number: 1 Primary Dehisced Wound Etiology: Wound Location: Right Upper Leg Wound Open Wounding Event: Surgical Injury Status: Date Acquired: 05/22/2022 Comorbid Asthma, Chronic Obstructive Pulmonary Disease (COPD), Weeks Of Treatment: 4 History: Hypertension, Osteoarthritis, Neuropathy Clustered Wound: No Photos NASHUA, SHRIVER ([id number]) [id number] Page 7 of 8 Wound Measurements Length: (cm) 2 Width: (cm) 0.5 Depth: (cm) 0.1 Area: (cm) 0.785 Volume: (cm) 0.079 % Reduction in Area: 96.5% % Reduction in Volume: 99.9% Epithelialization: Small (1-33%) Tunneling: No Undermining: No Wound Description Classification: Full Thickness With Exposed Suppor Exudate Amount: Medium Exudate Type: Serosanguineous Exudate Color: red, brown t Structures Slough/Fibrino Yes Wound Bed Granulation  Amount: Large (67-100%) Exposed Structure Granulation Quality: Red, Hyper-granulation Fascia Exposed: Yes Necrotic Amount: None Present (0%) Treatment Notes Wound #1 (Upper Leg) Wound Laterality: Right Cleanser Peri-Wound Care Topical Primary Dressing Hydrofera Blue Ready Transfer Foam, 4x5 (in/in) Discharge Instruction: Apply Hydrofera Blue Ready to wound bed as directed Secondary Dressing (BORDER) Zetuvit Plus SILICONE BORDER Dressing 4x4 (in/in) Discharge Instruction: Please do not put silicone bordered dressings under wraps. Use non-bordered dressing only. Secured With Compression Wrap Compression Stockings Environmental education officer) Signed: 07/13/2022 4:28:51 PM By: Carlene Coria RN Entered By: Carlene Coria on 07/13/2022 10:28:22 [name] ([id number]) [id number] Page 8 of 8 -------------------------------------------------------------------------------- Vitals Details Patient Name: Date of Service: [place], [place] Blanchard 07/13/2022 10:15 A M Medical Record Number: [id number] Patient Account Number: [id number] Date of Birth/Sex: Treating RN: [birth year] (72 y.o. Jack Blanchard) Carlene Coria Primary Care Saadia Dewitt: Royetta Crochet Other Clinician: Referring Charlean Carneal: Treating Madaleine Simmon/Extender: Gaynelle Cage in Treatment: 4 Vital Signs Time Taken: 10:23 Temperature (F): 97.6 Height (in): 71 Pulse (bpm): 67 Weight (lbs): 263 Respiratory Rate (breaths/min): 18 Body Mass Index (BMI): 36.7 Blood Pressure (mmHg): 144/76 Reference Range: 80 - 120 mg / dl Electronic Signature(s) Signed: 07/13/2022 4:28:51 PM By: Carlene Coria RN Entered By: Carlene Coria on 07/13/2022 10:23:54

## 2022-07-14 NOTE — Progress Notes (Signed)
Jack Blanchard, Jack Blanchard ([id number]) [id number] Page 1 of 7 Visit Report for 07/13/2022 Chief Complaint Document Details Patient Name: Date of Service: [name], [name] 07/13/2022 10:15 A M Medical Record Number: [id number] Patient Account Number: [id number] Date of Birth/Sex: Treating RN: July 13, 1950 (72 y.o. Jack Blanchard) Carlene Coria Primary Care Provider: Royetta Crochet Other Clinician: Referring Provider: Treating Provider/Extender: Gaynelle Cage in Treatment: 4 Information Obtained from: Patient Chief Complaint Right hip replacement with surgical wound dehiscence Electronic Signature(s) Signed: 07/13/2022 10:55:38 AM By: Worthy Keeler PA-C Entered By: Worthy Keeler on 07/13/2022 10:55:38 -------------------------------------------------------------------------------- HPI Details Patient Name: Date of Service: [name], [name] 07/13/2022 10:15 A M Medical Record Number: [id number] Patient Account Number: [id number] Date of Birth/Sex: Treating RN: 04-08-1951 (72 y.o. Jack Blanchard) Carlene Coria Primary Care Provider: Royetta Crochet Other Clinician: Referring Provider: Treating Provider/Extender: Gaynelle Cage in Treatment: 4 History of Present Illness HPI Description: 06-12-2022 upon evaluation today patient presents for initial inspection here in the clinic concerning issues that he has been having with a wound currently on the right upper leg location. 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 With that being said there is definitely a deep enough area to support muscle exposure although fortunately I do not think there is any structure that is going to be covered with any type of contact later this is good news. He has a lot of debridement the needs to happen as far as some adherent slough and necrotic tissue also appears to have a seroma pocket which I found upon inspection of the wound today as well. Overall I think that if  we manage these issues and continue with the wound VAC appropriately placed and keeping it in place this should help this area to heal effectively and quickly. I discussed that with the patient today as well. And he is happy to proceed with the plan. Nonetheless this is a wound that initially occurred on January 5 when the patient had a total hip replacement. Since that time he tells me that he has had the wound VAC since pretty much the surgery initially I think it was being used to try to help with the surgical incision site and then once a day he has they have been packing this and performing the dressing changes at his orthopedic office. 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 06-22-2022 upon evaluation today patient appears to be doing well currently in regard to his wound other than the fact that there was some odor according to the nurse after removing the wound VAC. I am a little concerned about the possibility of infection and for that reason I am going to go ahead and obtain a wound culture. Jack Blanchard, Jack Blanchard ([id number]) [id number] Page 2 of 7 06-29-2022 upon evaluation today patient's wound is actually showing signs of excellent improvement. I am very pleased with where things stand we are actually doing a Dakin's wet-to-dry dressing and he seems to have done extremely well for him and overall I am extremely pleased at this point. I do think that since he is doing so well without the wound VAC there is really no need to reinitiate that in fact the wound is significantly smaller at this point. 07-06-2022 upon evaluation today patient's wound is actually showing  signs of excellent improvement. In fact other than being a little bit hyper regulated having some minimal slough noted on the  surface of the wound he actually is doing extremely well. 07-13-2022 upon evaluation today patient appears to be doing excellent in regard to his wound which is actually showing signs of great improvement. I do not see any evidence of active infection locally nor systemically which is great news and overall I think that we are on the right track here. In fact I think he is close to being completely done. I do believe that he is going to require some chemical cauterization with silver nitrate to treat the hypergranulation. We discussed that today. Electronic Signature(s) Signed: 07/13/2022 11:30:10 AM By: Worthy Keeler PA-C Entered By: Worthy Keeler on 07/13/2022 11:30:10 -------------------------------------------------------------------------------- Jack Blanchard TISS Details Patient Name: Date of Service: [place], [name] 07/13/2022 10:15 A M Medical Record Number: [id number] Patient Account Number: [id number] Date of Birth/Sex: Treating RN: 1951-03-28 (72 y.o. Jack Blanchard) Carlene Coria Primary Care Provider: Royetta Crochet Other Clinician: Referring Provider: Treating Provider/Extender: Gaynelle Cage in Treatment: 4 Procedure Performed for: Wound #1 Right Upper Leg Performed By: Physician Tommie Sams., PA-C Post Procedure Diagnosis Same as Pre-procedure Notes silver nitrate Electronic Signature(s) Signed: 07/13/2022 4:28:51 PM By: Carlene Coria RN Entered By: Carlene Coria on 07/13/2022 10:53:53 -------------------------------------------------------------------------------- Physical Exam Details Patient Name: Date of Service: [name], [name] 07/13/2022 10:15 A M Medical Record Number: [id number] Patient Account Number: [id number] Date of Birth/Sex: Treating RN: Aug 17, 1950 (72 y.o. Jack Blanchard Primary Care Provider: Royetta Crochet Other Clinician: Referring Provider: Treating Provider/Extender: Gaynelle Cage in Treatment: [place], Jack Blanchard  ([id number]) [id number] Page 3 of 7 Constitutional Obese and well-hydrated in no acute distress. Respiratory normal breathing without difficulty. Psychiatric this patient is able to make decisions and demonstrates good insight into disease process. Alert and Oriented x 3. pleasant and cooperative. Notes Upon inspection patient showed signs of hypergranulation I did actually perform chemical cauterization with silver nitrate and he tolerated that without complication. Post cauterization this appears to be doing much better and I think that this will help this to close much more rapidly. Electronic Signature(s) Signed: 07/13/2022 11:31:03 AM By: Worthy Keeler PA-C Entered By: Worthy Keeler on 07/13/2022 11:31:03 -------------------------------------------------------------------------------- Physician Orders Details Patient Name: Date of Service: [place], [place] 07/13/2022 10:15 A M Medical Record Number: [id number] Patient Account Number: [id number] Date of Birth/Sex: Treating RN: 1950-10-06 (71 y.o. Jack Blanchard Primary Care Provider: Royetta Crochet Other Clinician: Referring Provider: Treating Provider/Extender: Gaynelle Cage in Treatment: 4 Verbal / Phone Orders: No Diagnosis Coding Follow-up Appointments Return Appointment in 1 week. Nurse Visit as needed Bathing/ Shower/ Hygiene No tub bath. Negative Pressure Wound Therapy Discontinue NPWT. - Patient to return to vendor. Wound Treatment Wound #1 - Upper Leg Wound Laterality: Right Prim Dressing: Hydrofera Blue Ready Transfer Foam, 4x5 (in/in) 1 x Per Day/7 Days ary Discharge Instructions: Apply Hydrofera Blue Ready to wound bed as directed Secondary Dressing: (BORDER) Zetuvit Plus SILICONE BORDER Dressing 4x4 (in/in) 1 x Per Day/7 Days Discharge Instructions: Please do not put silicone bordered dressings under wraps. Use non-bordered dressing only. Electronic  Signature(s) Signed: 07/13/2022 4:28:51 PM By: Carlene Coria RN Signed: 07/13/2022 5:34:14 PM By: Worthy Keeler PA-C Entered By: Carlene Coria on 07/13/2022 10:52:53 Jack Blanchard ([id number]) [id number] Page 4 of 7 -------------------------------------------------------------------------------- Problem List Details Patient Name: Date of Service: [place], [name] 07/13/2022 10:15 A M Medical Record Number: [id number] Patient Account Number: [id number] Date of Birth/Sex: Treating RN: 27-Apr-1951 (72 y.o. Jack Blanchard) Carlene Coria Primary Care Provider: Royetta Crochet Other Clinician: Referring Provider: Treating Provider/Extender: Gaynelle Cage in Treatment: 4 Active Problems ICD-10 Encounter Code Description Active Date MDM Diagnosis T81.31XA Disruption of external operation (surgical) wound, not elsewhere classified, 06/12/2022 No Yes initial encounter L98.492 Non-pressure chronic ulcer of skin of other sites with fat layer exposed 06/12/2022 No Yes Z96.641 Presence of right artificial hip joint 06/12/2022 No Yes I10 Essential (primary) hypertension 06/12/2022 No Yes Inactive Problems Resolved Problems Electronic Signature(s) Signed: 07/13/2022 10:55:35 AM By: Worthy Keeler PA-C Entered By: Worthy Keeler on 07/13/2022 10:55:35 -------------------------------------------------------------------------------- Progress Note Details Patient Name: Date of Service: [place], [place] 07/13/2022 10:15 A M Medical Record Number: [id number] Patient Account Number: [id number] Date of Birth/Sex: Treating RN: Oct 09, 1950 (71 y.o. Jack Blanchard Primary Care Provider: Royetta Crochet Other Clinician: Referring Provider: Treating Provider/Extender: Gaynelle Cage in Treatment: [place], Jack Blanchard ([id number]) [id number] Page 5 of 7 Subjective Chief Complaint Information obtained from Patient Right hip replacement with surgical  wound dehiscence History of Present Illness (HPI) 06-12-2022 upon evaluation today patient presents for initial inspection here in the clinic concerning issues that he has been having with a wound currently on the right upper leg location. 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 With that being said there is definitely a deep enough area to support muscle exposure although fortunately I do not think there is any structure that is going to be covered with any type of contact later this is good news. He has a lot of debridement the needs to happen as far as some adherent slough and necrotic tissue also appears to have a seroma pocket which I found upon inspection of the wound today as well. Overall I think that if we manage these issues and continue with the wound VAC appropriately placed and keeping it in place this should help this area to heal effectively and quickly. I discussed that with the patient today as well. And he is happy to proceed with the plan. Nonetheless this is a wound that initially occurred on January 5 when the patient had a total hip replacement. Since that time he tells me that he has had the wound VAC since pretty much the surgery initially I think it was being used to try to help with the surgical incision site and then once a day he has they have been packing this and performing the dressing changes at his orthopedic office. 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 06-22-2022 upon evaluation today patient appears to be doing well currently in regard to his wound other than the fact that there was some odor according to the nurse after removing the wound VAC. I am a little concerned about the possibility of infection and for that reason I am going to go ahead and  obtain a wound culture. 06-29-2022 upon evaluation today patient's wound is actually showing signs of excellent improvement. I am very pleased with where things stand we are actually doing a Dakin's wet-to-dry dressing and he seems to have done extremely well for him and overall I am extremely pleased at this  point. I do think that since he is doing so well without the wound VAC there is really no need to reinitiate that in fact the wound is significantly smaller at this point. 07-06-2022 upon evaluation today patient's wound is actually showing signs of excellent improvement. In fact other than being a little bit hyper regulated having some minimal slough noted on the surface of the wound he actually is doing extremely well. 07-13-2022 upon evaluation today patient appears to be doing excellent in regard to his wound which is actually showing signs of great improvement. I do not see any evidence of active infection locally nor systemically which is great news and overall I think that we are on the right track here. In fact I think he is close to being completely done. I do believe that he is going to require some chemical cauterization with silver nitrate to treat the hypergranulation. We discussed that today. Objective Constitutional Obese and well-hydrated in no acute distress. Vitals Time Taken: 10:23 AM, Height: 71 in, Weight: 263 lbs, BMI: 36.7, Temperature: 97.6 F, Pulse: 67 bpm, Respiratory Rate: 18 breaths/min, Blood Pressure: 144/76 mmHg. Respiratory normal breathing without difficulty. Psychiatric this patient is able to make decisions and demonstrates good insight into disease process. Alert and Oriented x 3. pleasant and cooperative. General Notes: Upon inspection patient showed signs of hypergranulation I did actually perform chemical cauterization with silver nitrate and he tolerated that without complication. Post cauterization this appears to be doing much better and I think that  this will help this to close much more rapidly. Integumentary (Hair, Skin) Wound #1 status is Open. Original cause of wound was Surgical Injury. The date acquired was: 05/22/2022. The wound has been in treatment 4 weeks. The wound is located on the Right Upper Leg. The wound measures 2cm length x 0.5cm width x 0.1cm depth; 0.785cm^2 area and 0.079cm^3 volume. There is fascia exposed. There is no tunneling or undermining noted. There is a medium amount of serosanguineous drainage noted. There is large (67-100%) red, hyper - granulation within the wound bed. There is no necrotic tissue within the wound bed. 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 An CHEM CAUT GRANULATION TISS procedure was performed by Tommie Sams., PA-C. Post procedure Diagnosis Wound #1: Same as Pre-Procedure Notes: silver nitrate Plan Follow-up Appointments: Return Appointment in 1 week. Nurse Visit as needed Bathing/ Shower/ Hygiene: No tub bath. Negative Pressure Wound Therapy: Discontinue NPWT - Patient to return to vendor. . WOUND #1: - Upper Leg Wound Laterality: Right Prim Dressing: Hydrofera Blue Ready Transfer Foam, 4x5 (in/in) 1 x Per Day/7 Days ary Discharge Instructions: Apply Hydrofera Blue Ready to wound bed as directed Secondary Dressing: (BORDER) Zetuvit Plus SILICONE BORDER Dressing 4x4 (in/in) 1 x Per Day/7 Days Discharge Instructions: Please do not put silicone bordered dressings under wraps. Use non-bordered dressing only. 1. I am good recommend that we have the patient continue to  monitor for any signs of infection or worsening. Obviously if we notice anything changes the patient should contact the office let me know as quickly as possible. 2. I am also can recommend that he continue with the [facility] specifically and  he is in agreement with plan. We will see patient back for reevaluation in 1 week here in the clinic. If anything worsens or changes patient will contact our office for additional recommendations. Electronic Signature(s) Signed: 07/13/2022 11:31:31 AM By: Worthy Keeler PA-C Entered By: Worthy Keeler on 07/13/2022 11:31:31 -------------------------------------------------------------------------------- SuperBill Details Patient Name: Date of Service: [place], [place] 07/13/2022 Medical Record Number: [id number] Patient Account Number: [id number] Date of Birth/Sex: Treating RN: [birth year] (72 y.o. Jack Blanchard) Carlene Coria Primary Care Provider: Royetta Crochet Other Clinician: Referring Provider: Treating Provider/Extender: Gaynelle Cage in Treatment: 4 Diagnosis Coding ICD-10 Codes Code Description T81.31XA Disruption of external operation (surgical) wound, not elsewhere classified, initial encounter L98.492 Non-pressure chronic ulcer of skin of other sites with fat layer exposed Z96.641 Presence of right artificial hip joint I10 Essential (primary) hypertension Facility Procedures : Jack Blanchard, Jack Blanchard Code: [id number] Jack Blanchard ([id number]) ICD-1 Description: [place] T[facility] [phone] Diagnosis Description T81.31XA Disruption of external operation (surgical) wound, not elsewhere classified, initial e Modifier: [id number] ncounter Quantity: 1 Page 7 of 7 Physician Procedures : CPT4 Code Description Modifier [id number] - WC PHYS CHEM CAUT GRAN TISSUE ICD-10 Diagnosis Description T81.31XA Disruption of external operation (surgical) wound, not elsewhere classified, initial encounter Quantity: 1 Electronic  Signature(s) Signed: 07/13/2022 11:31:38 AM By: Worthy Keeler PA-C Entered By: Worthy Keeler on 07/13/2022 11:31:38

## 2022-07-20 ENCOUNTER — Ambulatory Visit: Payer: 59 | Admitting: Internal Medicine

## 2022-07-21 ENCOUNTER — Encounter: Payer: 59 | Attending: Internal Medicine | Admitting: Internal Medicine

## 2022-07-21 DIAGNOSIS — M1990 Unspecified osteoarthritis, unspecified site: Secondary | ICD-10-CM | POA: Insufficient documentation

## 2022-07-21 DIAGNOSIS — G629 Polyneuropathy, unspecified: Secondary | ICD-10-CM | POA: Insufficient documentation

## 2022-07-21 DIAGNOSIS — J449 Chronic obstructive pulmonary disease, unspecified: Secondary | ICD-10-CM | POA: Diagnosis not present

## 2022-07-21 DIAGNOSIS — I10 Essential (primary) hypertension: Secondary | ICD-10-CM | POA: Diagnosis not present

## 2022-07-21 DIAGNOSIS — L98492 Non-pressure chronic ulcer of skin of other sites with fat layer exposed: Secondary | ICD-10-CM | POA: Insufficient documentation

## 2022-07-24 NOTE — Progress Notes (Signed)
Jack Blanchard, Jack Blanchard ([id number]) [id number] Page 1 of 8 Visit Report for 07/21/2022 Arrival Information Details Patient Name: Date of Service: [place], [place] 07/21/2022 2:30 PM Medical Record Number: [id number] Patient Account Number: [id number] Date of Birth/Sex: Treating RN: October 24, 1950 (72 y.o. Jack Blanchard Primary Care Melitta Tigue: Royetta Crochet Other Clinician: Massie Kluver Referring Jeany Seville: Treating Bently Morath/Extender: RO BSO N, MICHA EL Collie Siad, Nila Nephew in Treatment: 5 Visit Information History Since Last Visit All ordered tests and consults were completed: No Patient Arrived: Ambulatory Added or deleted any medications: No Arrival Time: 14:42 Any new allergies or adverse reactions: No Transfer Assistance: None Had a fall or experienced change in No Patient Identification Verified: Yes activities of daily living that may affect Secondary Verification Process Completed: Yes risk of falls: Patient Has Alerts: Yes Signs or symptoms of abuse/neglect since last visito No Patient Alerts: Patient on Blood Thinner Hospitalized since last visit: No '81mg'$  aspririn Implantable device outside of the clinic excluding No cellular tissue based products placed in the center since last visit: Has Dressing in Place as Prescribed: Yes Pain Present Now: No Electronic Signature(s) Signed: 07/22/2022 4:49:21 PM By: Massie Kluver Entered By: Massie Kluver on 07/21/2022 14:45:45 -------------------------------------------------------------------------------- Clinic Level of Care Assessment Details Patient Name: Date of Service: [place], [place] 07/21/2022 2:30 PM Medical Record Number: [id number] Patient Account Number: [id number] Date of Birth/Sex: Treating RN: 1950/07/12 (72 y.o. Jack Blanchard Primary Care Trinady Milewski: Royetta Crochet Other Clinician: Massie Kluver Referring Youa Deloney: Treating Koleton Duchemin/Extender: RO BSO N, MICHA EL Collie Siad, Nila Nephew in Treatment:  5 Clinic Level of Care Assessment Items TOOL 4 Quantity Score '[]'$  - 0 Use when only an EandM is performed on FOLLOW-UP visit ASSESSMENTS - Nursing Assessment / Reassessment X- 1 10 Reassessment of Co-morbidities (includes updates in patient status) X- 1 5 Reassessment of Adherence to Treatment Plan Jack Blanchard, Jack Blanchard ([id number]) [id number] Page 2 of 8 ASSESSMENTS - Wound and Skin A ssessment / Reassessment X - Simple Wound Assessment / Reassessment - one wound 1 5 '[]'$  - 0 Complex Wound Assessment / Reassessment - multiple wounds '[]'$  - 0 Dermatologic / Skin Assessment (not related to wound area) ASSESSMENTS - Focused Assessment '[]'$  - 0 Circumferential Edema Measurements - multi extremities '[]'$  - 0 Nutritional Assessment / Counseling / Intervention '[]'$  - 0 Lower Extremity Assessment (monofilament, tuning fork, pulses) '[]'$  - 0 Peripheral Arterial Disease Assessment (using hand held doppler) ASSESSMENTS - Ostomy and/or Continence Assessment and Care '[]'$  - 0 Incontinence Assessment and Management '[]'$  - 0 Ostomy Care Assessment and Management (repouching, etc.) PROCESS - Coordination of Care X - Simple Patient / Family Education for ongoing care 1 15 '[]'$  - 0 Complex (extensive) Patient / Family Education for ongoing care '[]'$  - 0 Staff obtains Programmer, systems, Records, T Results / Process Orders est '[]'$  - 0 Staff telephones HHA, Nursing Homes / Clarify orders / etc '[]'$  - 0 Routine Transfer to another Facility (non-emergent condition) '[]'$  - 0 Routine Hospital Admission (non-emergent condition) '[]'$  - 0 New Admissions / Biomedical engineer / Ordering NPWT Apligraf, etc. , '[]'$  - 0 Emergency Hospital Admission (emergent condition) X- 1 10 Simple Discharge Coordination '[]'$  - 0 Complex (extensive) Discharge Coordination PROCESS - Special Needs '[]'$  - 0 Pediatric / Minor Patient Management '[]'$  - 0 Isolation Patient Management '[]'$  - 0 Hearing / Language / Visual special  needs '[]'$  - 0 Assessment of Community assistance (transportation, D/C planning, etc.) '[]'$  - 0 Additional assistance / Altered mentation '[]'$  - 0 Support Surface(s)  Assessment (bed, cushion, seat, etc.) INTERVENTIONS - Wound Cleansing / Measurement X - Simple Wound Cleansing - one wound 1 5 '[]'$  - 0 Complex Wound Cleansing - multiple wounds X- 1 5 Wound Imaging (photographs - any number of wounds) '[]'$  - 0 Wound Tracing (instead of photographs) '[]'$  - 0 Simple Wound Measurement - one wound '[]'$  - 0 Complex Wound Measurement - multiple wounds INTERVENTIONS - Wound Dressings X - Small Wound Dressing one or multiple wounds 1 10 '[]'$  - 0 Medium Wound Dressing one or multiple wounds '[]'$  - 0 Large Wound Dressing one or multiple wounds '[]'$  - 0 Application of Medications - topical '[]'$  - 0 Application of Medications - injection INTERVENTIONS - Miscellaneous '[]'$  - 0 External ear exam Jack Blanchard, Jack Blanchard ([id number] Page 3 of 8 '[]'$  - 0 Specimen Collection (cultures, biopsies, blood, body fluids, etc.) '[]'$  - 0 Specimen(s) / Culture(s) sent or taken to Lab for analysis '[]'$  - 0 Patient Transfer (multiple staff / Harrel Lemon Lift / Similar devices) '[]'$  - 0 Simple Staple / Suture removal (25 or less) '[]'$  - 0 Complex Staple / Suture removal (26 or more) '[]'$  - 0 Hypo / Hyperglycemic Management (close monitor of Blood Glucose) '[]'$  - 0 Ankle / Brachial Index (ABI) - do not check if billed separately X- 1 5 Vital Signs Has the patient been seen at the hospital within the last three years: Yes Total Score: 70 Level Of Care: New/Established - Level 2 Electronic Signature(s) Signed: 07/22/2022 4:49:21 PM By: Massie Kluver Entered By: Massie Kluver on 07/21/2022 15:06:10 -------------------------------------------------------------------------------- Encounter Discharge Information Details Patient Name: Date of Service: [place], [place] 07/21/2022 2:30 PM Medical Record Number:  [id number] Patient Account Number: [id number] Date of Birth/Sex: Treating RN: 1950-09-20 (72 y.o. Jack Blanchard Primary Care Timoteo Carreiro: Royetta Crochet Other Clinician: Massie Kluver Referring Audriella Blakeley: Treating Fidelia Cathers/Extender: RO BSO Delane Ginger, MICHA EL Collie Siad, Nila Nephew in Treatment: 5 Encounter Discharge Information Items Discharge Condition: Stable Ambulatory Status: Ambulatory Discharge Destination: Home Transportation: Private Auto Accompanied By: self Schedule Follow-up Appointment: Yes Clinical Summary of Care: Electronic Signature(s) Signed: 07/22/2022 4:49:21 PM By: Massie Kluver Entered By: Massie Kluver on 07/21/2022 15:19:48 Lower Extremity Assessment Details -------------------------------------------------------------------------------- [name] ([id number] Page 4 of 8 Patient Name: Date of Service: [place], [place] 07/21/2022 2:30 PM Medical Record Number: [id number] Patient Account Number: [id number] Date of Birth/Sex: Treating RN: Dec 16, 1950 (72 y.o. Jack Blanchard Primary Care Natnael Biederman: Royetta Crochet Other Clinician: Massie Kluver Referring Kristapher [place]: Treating Vashon Arch/Extender: RO BSO Delane Ginger, MICHA EL Novella Rob in Treatment: 5 Electronic Signature(s) Signed: 07/22/2022 4:49:21 PM By: Massie Kluver Signed: 07/22/2022 9:49:41 PM By: Gretta Cool, BSN, RN, CWS, Kim RN, BSN Entered By: Massie Kluver on 07/21/2022 14:56:56 -------------------------------------------------------------------------------- Multi Wound Chart Details Patient Name: Date of Service: [place], [place] 07/21/2022 2:30 PM Medical Record Number: [id number] Patient Account Number: [id number] Date of Birth/Sex: Treating RN: 1950/08/14 (72 y.o. Jack Blanchard Primary Care Richy Spradley: Royetta Crochet Other Clinician: Massie Kluver Referring Lynanne Delgreco: Treating Lauryl Seyer/Extender: RO BSO N, MICHA EL Collie Siad, Nila Nephew in Treatment: 5 Vital Signs Height(in):  71 Pulse(bpm): 45 Weight(lbs): [id number] Blood Pressure(mmHg): 129/74 Body Mass Index(BMI): 36.7 Temperature(F): 97.8 Respiratory Rate(breaths/min): 18 [1:Photos:] [N/A:N/A] Right Upper Leg N/A N/A Wound Location: Surgical Injury N/A N/A Wounding Event: Dehisced Wound N/A N/A Primary Etiology: Asthma, Chronic Obstructive N/A N/A Comorbid History: Pulmonary Disease (COPD), Hypertension, Osteoarthritis, Neuropathy 05/22/2022 N/A N/A Date Acquired: 5 N/A N/A Weeks of Treatment: Open N/A N/A Wound Status:  No N/A N/A Wound Recurrence: 0.1x0.1x0.1 N/A N/A Measurements L x W x D (cm) 0.008 N/A N/A A (cm) : rea 0.001 N/A N/A Volume (cm) : 100.00% N/A N/A % Reduction in Area: 100.00% N/A N/A % Reduction in Volume: Full Thickness With Exposed Support N/A N/A Classification: Structures None Present N/A N/A Exudate A mount: None Present (0%) N/A N/A Granulation A mount: None Present (0%) N/A N/A Necrotic A mount: Fascia: Yes N/A N/A Exposed Structures: Large (67-100%) N/A N/A EpithelializationZAKRY, Jack Blanchard ([id number] Page 5 of 8 Treatment Notes Electronic Signature(s) Signed: 07/22/2022 4:49:21 PM By: Massie Kluver Entered By: Massie Kluver on 07/21/2022 14:57:01 -------------------------------------------------------------------------------- Multi-Disciplinary Care Plan Details Patient Name: Date of Service: [place], [place] 07/21/2022 2:30 PM Medical Record Number: [id number] Patient Account Number: [id number] Date of Birth/Sex: Treating RN: 09/09/1950 (72 y.o. Jack Blanchard Primary Care Shavy Beachem: Royetta Crochet Other Clinician: Massie Kluver Referring Kahlen Boyde: Treating Yechiel Erny/Extender: RO BSO N, MICHA EL Novella Rob in Treatment: 5 Active Inactive Electronic Signature(s) Signed: 07/22/2022 4:49:21 PM By: Massie Kluver Signed: 07/22/2022 9:49:41 PM By: Gretta Cool, BSN, RN, CWS, Kim RN, BSN Entered By: Massie Kluver on  07/21/2022 15:06:48 -------------------------------------------------------------------------------- Pain Assessment Details Patient Name: Date of Service: [place], [place] 07/21/2022 2:30 PM Medical Record Number: [id number] Patient Account Number: [id number] Date of Birth/Sex: Treating RN: [birth year] (72 y.o. Jack Blanchard Primary Care Shyanne Mcclary: Royetta Crochet Other Clinician: Massie Kluver Referring Dailah Opperman: Treating Sye Schroepfer/Extender: RO BSO N, MICHA EL Collie Siad, Nila Nephew in Treatment: 5 Active Problems Location of Pain Severity and Description of Pain Patient Has Paino No Site Locations Jack Blanchard, Jack Blanchard ([id number]) [id number] Page 6 of 8 Pain Management and Medication Current Pain Management: Electronic Signature(s) Signed: 07/22/2022 4:49:21 PM By: Massie Kluver Signed: 07/22/2022 9:49:41 PM By: Gretta Cool, BSN, RN, CWS, Kim RN, BSN Entered By: Massie Kluver on 07/21/2022 14:47:59 -------------------------------------------------------------------------------- Patient/Caregiver Education Details Patient Name: Date of Service: [place], [name] 3/5/2024andnbsp2:30 PM Medical Record Number: [id number] Patient Account Number: [id number] Date of Birth/Gender: Treating RN: January 05, 1951 (72 y.o. Jack Blanchard Primary Care Physician: Royetta Crochet Other Clinician: Massie Kluver Referring Physician: Treating Physician/Extender: RO BSO Delane Ginger, MICHA EL Collie Siad, Nila Nephew in Treatment: 5 Education Assessment Education Provided To: Patient Education Topics Provided Wound/Skin Impairment: Handouts: Other: Your wound has healed. please call if any further issues arise Methods: Explain/Verbal Responses: State content correctly Electronic Signature(s) Signed: 07/22/2022 4:49:21 PM By: Massie Kluver Entered By: Massie Kluver on 07/21/2022 15:19:19 Jack Blanchard, Jack Blanchard ([id number] Page 7 of  8 -------------------------------------------------------------------------------- Wound Assessment Details Patient Name: Date of Service: [place], [name] 07/21/2022 2:30 PM Medical Record Number: [id number] Patient Account Number: [id number] Date of Birth/Sex: Treating RN: 07-06-1950 (72 y.o. Jack Blanchard, Jack Blanchard Primary Care Naliya Gish: Royetta Crochet Other Clinician: Massie Kluver Referring [place] Lap[place]: Treating Lori-Ann Lindfors/Extender: RO BSO N, MICHA EL Collie Siad, Nila Nephew in Treatment: 5 Wound Status Wound Number: 1 Primary Dehisced Wound Etiology: Wound Location: Right Upper Leg Wound Healed - Epithelialized Wounding Event: Surgical Injury Status: Date Acquired: 05/22/2022 Comorbid Asthma, Chronic Obstructive Pulmonary Disease (COPD), Weeks Of Treatment: 5 History: Hypertension, Osteoarthritis, Neuropathy Clustered Wound: No Photos Wound Measurements Length: (cm) Width: (cm) Depth: (cm) Area: (cm) Volume: (cm) 0 % Reduction in Area: 100% 0 % Reduction in Volume: 100% 0 Epithelialization: Large (67-100%) 0 0 Wound Description Classification: Full Thickness With Exposed Support Structures Exudate Amount: None Present Foul Odor After Cleansing: No Slough/Fibrino No Wound Bed Granulation Amount: None Present (0%) Exposed Structure  Necrotic Amount: None Present (0%) Fascia Exposed: Yes Treatment Notes Wound #1 (Upper Leg) Wound Laterality: Right Cleanser Peri-Wound Care Topical Primary Dressing Secondary Dressing Secured With Compression Jack Blanchard, Jack Blanchard ([id number]) [id number] Page 8 of 8 Compression Stockings Add-Ons Electronic Signature(s) Signed: 07/22/2022 4:49:21 PM By: Massie Kluver Signed: 07/22/2022 9:49:41 PM By: Gretta Cool, BSN, RN, CWS, Kim RN, BSN Entered By: Massie Kluver on 07/21/2022 15:01:43 -------------------------------------------------------------------------------- Vitals Details Patient Name: Date of Service: [place], [place]  07/21/2022 2:30 PM Medical Record Number: [id number] Patient Account Number: [id number] Date of Birth/Sex: Treating RN: 08-26-1950 (72 y.o. Jack Blanchard, Jack Blanchard Primary Care Andrena Margerum: Royetta Crochet Other Clinician: Massie Kluver Referring Giordano Getman: Treating Jack Blanchard/Extender: RO BSO N, MICHA EL Collie Siad, Nila Nephew in Treatment: 5 Vital Signs Time Taken: 14:46 Temperature (F): 97.8 Height (in): 71 Pulse (bpm): 53 Weight (lbs): 263 Respiratory Rate (breaths/min): 18 Body Mass Index (BMI): 36.7 Blood Pressure (mmHg): 129/74 Reference Range: 80 - 120 mg / dl Electronic Signature(s) Signed: 07/22/2022 4:49:21 PM By: Massie Kluver Entered By: Massie Kluver on 07/21/2022 14:47:53

## 2022-07-24 NOTE — Progress Notes (Signed)
Jack Blanchard ([id number]) [id number] Page 1 of 5 Visit Report for 07/21/2022 HPI Details Patient Name: Date of Service: Jack Blanchard, Jack Blanchard 07/21/2022 2:30 PM Medical Record Number: [id number] Patient Account Number: [id number] Date of Birth/Sex: Treating RN: [birth year] (72 y.o. Jack Blanchard Primary Care Provider: Royetta Crochet Other Clinician: Massie Kluver Referring Provider: Treating Provider/Extender: RO BSO N, MICHA EL Collie Siad, Nila Nephew in Treatment: 5 History of Present Illness HPI Description: 06-12-2022 upon evaluation today patient presents for initial inspection here in the clinic concerning issues that he has been having with a wound currently on the right upper leg location. 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 With that being said there is definitely a deep enough area to support muscle exposure although fortunately I do not think there is any structure that is going to be covered with any type of contact later this is good news. He has a lot of debridement the needs to happen as far as some adherent slough and necrotic tissue also appears to have a seroma pocket which I found upon inspection of the wound today as well. Overall I think that if we manage these issues and continue with the wound VAC appropriately placed and keeping it in place this should help this area to heal effectively and quickly. I discussed that with the patient today as well. And he is happy to proceed with the plan. Nonetheless this is a wound that initially occurred on January 5 when the patient had a total hip replacement. Since that time he tells me that he has had the wound VAC since pretty much the surgery initially I think it was being used to try to help with the surgical incision site and then once a day he has they have been packing this and performing the dressing changes at his orthopedic office. 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 06-22-2022 upon evaluation today patient appears to be doing well currently in regard to his wound other than the fact that there was some odor according to the nurse after removing the wound VAC. I am a little concerned about the possibility of infection and for that reason I am going to go ahead and obtain a wound culture. 06-29-2022 upon evaluation today patient's wound is actually showing signs of excellent improvement. I am very pleased with where things stand we are actually doing a Dakin's wet-to-dry dressing and he seems to have done extremely well for him and overall I am extremely pleased at this point. I do think that since he is doing so well without the wound VAC there is really no need to reinitiate that in fact the wound is significantly smaller at this point. 07-06-2022 upon evaluation today patient's wound is actually showing signs of excellent improvement. In fact other than being a little bit hyper regulated having some minimal slough noted on the surface of the wound he actually is doing extremely well. 07-13-2022 upon evaluation today patient appears to be doing excellent in regard to his wound which is actually showing signs of great improvement. I do not see any evidence of active infection locally nor systemically which is great news and overall I think that we are on the right track here. In fact I think he is close to being  completely done. I do believe that he is going to require some chemical cauterization with silver nitrate to treat the hypergranulation. We discussed that today. 3/5; what sounds to be a complicated postop infection. The wound actually is healed Electronic Signature(s) Signed: 07/21/2022 4:03:34 PM By: Linton Ham MD Entered By: Linton Ham on 07/21/2022  15:03:30 -------------------------------------------------------------------------------- Physical Exam Details Patient Name: Date of Service: Jack Blanchard, Jack Blanchard 07/21/2022 2:30 PM Medical Record Number: [id number] Patient Account Number: [id number] Jack Blanchard ([id number]) [phone].pdf Page 2 of 5 Date of Birth/Sex: Treating RN: 05-23-1950 (72 y.o. Jack Blanchard Primary Care Provider: Other Clinician: Scheryl Darter, Janace Hoard Referring Provider: Treating Provider/Extender: RO BSO N, MICHA EL Collie Siad, Nila Nephew in Treatment: 5 Constitutional Sitting or standing Blood Pressure is within target range for patient.. Pulse regular and within target range for patient.Marland Kitchen Respirations regular, non-labored and within target range.. Temperature is normal and within the target range for the patient.Marland Kitchen appears in no distress. Gastrointestinal (GI) No inguinal or umbilical hernias.. Notes Wound exam; right hip the area is totally healed. There is no drainage, no tenderness. Electronic Signature(s) Signed: 07/21/2022 4:03:34 PM By: Linton Ham MD Entered By: Linton Ham on 07/21/2022 15:04:13 -------------------------------------------------------------------------------- Physician Orders Details Patient Name: Date of Service: Jack Blanchard, Jack Blanchard 07/21/2022 2:30 PM Medical Record Number: [id number] Patient Account Number: [id number] Date of Birth/Sex: Treating RN: 1950/11/14 (72 y.o. 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 I10 Essential (primary) hypertension Discharge From [facility] Discharge from [facility] Complete Additional Orders / Instructions Other: - Place a protective bandage over area for next couple of weeks Electronic Signature(s) Signed: 07/21/2022 4:03:34 PM By: Linton Ham MD Signed: 07/22/2022 4:49:21 PM By: Massie Kluver Entered By: Massie Kluver on 07/21/2022 15:05:04 Raboin, Jack Blanchard ([id number]) [id number] Page 3 of 5 -------------------------------------------------------------------------------- Problem List Details Patient Name: Date of Service: Jack Blanchard, Jack Blanchard 07/21/2022 2:30 PM Medical Record Number: [id number] Patient Account Number: [id number] Date of Birth/Sex: Treating RN: 08-Jun-1950 (72 y.o. Jack Blanchard Primary Care Provider: Royetta Crochet Other Clinician: Massie Kluver Referring Provider: Treating Provider/Extender: RO BSO N, MICHA EL Collie Siad, Nila Nephew in Treatment: 5 Active Problems ICD-10 Encounter Code Description Active Date MDM Diagnosis T81.31XA Disruption of external operation (surgical) wound, not elsewhere classified, 06/12/2022 No Yes initial encounter L98.492 Non-pressure chronic ulcer of skin of other sites with fat layer exposed 06/12/2022 No Yes Z96.641 Presence of right artificial hip joint 06/12/2022 No Yes I10 Essential (primary) hypertension 06/12/2022 No Yes Inactive Problems Resolved Problems Electronic Signature(s) Signed: 07/21/2022 4:03:34 PM By: Linton Ham MD Entered By: Linton Ham on 07/21/2022 15:02:50 -------------------------------------------------------------------------------- Progress Note Details Patient Name: Date of Service: Jack Blanchard, Jack Blanchard 07/21/2022 2:30 PM Medical Record Number: [id number] Patient Account Number: [id number] Date of Birth/Sex: Treating RN: 03-05-1951 (72 y.o. Jack Blanchard Primary Care Provider: Royetta Crochet Other Clinician: Massie Kluver Referring  Provider: Treating Provider/Extender: Eldridge Dace, MICHA EL Novella Rob in Treatment: Jack Blanchard, Jack Blanchard ([id number]) [id number] Page 4 of 5 Subjective History of Present Illness (HPI) 06-12-2022 upon evaluation today patient presents for initial inspection here in  the clinic concerning issues that he has been having with a wound currently on the right upper leg location. 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 With that being said there is definitely a deep enough area to support muscle exposure although fortunately I do not think there is any structure that is going to be covered with any type of contact later this is good news. He has a lot of debridement the needs to happen as far as some adherent slough and necrotic tissue also appears to have a seroma pocket which I found upon inspection of the wound today as well. Overall I think that if we manage these issues and continue with the wound VAC appropriately placed and keeping it in place this should help this area to heal effectively and quickly. I discussed that with the patient today as well. And he is happy to proceed with the plan. Nonetheless this is a wound that initially occurred on January 5 when the patient had a total hip replacement. Since that time he tells me that he has had the wound VAC since pretty much the surgery initially I think it was being used to try to help with the surgical incision site and then once a day he has they have been packing this and performing the dressing changes at his orthopedic office. 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 06-22-2022 upon evaluation today patient appears to be doing well currently in regard to his wound other than the fact that there was  some odor according to the nurse after removing the wound VAC. I am a little concerned about the possibility of infection and for that reason I am going to go ahead and obtain a wound culture. 06-29-2022 upon evaluation today patient's wound is actually showing signs of excellent improvement. I am very pleased with where things stand we are actually doing a Dakin's wet-to-dry dressing and he seems to have done extremely well for him and overall I am extremely pleased at this point. I do think that since he is doing so well without the wound VAC there is really no need to reinitiate that in fact the wound is significantly smaller at this point. 07-06-2022 upon evaluation today patient's wound is actually showing signs of excellent improvement. In fact other than being a little bit hyper regulated having some minimal slough noted on the surface of the wound he actually is doing extremely well. 07-13-2022 upon evaluation today patient appears to be doing excellent in regard to his wound which is actually showing signs of great improvement. I do not see any evidence of active infection locally nor systemically which is great news and overall I think that we are on the right track here. In fact I think he is close to being completely done. I do believe that he is going to require some chemical cauterization with silver nitrate to treat the hypergranulation. We discussed that today. 3/5; what sounds to be a complicated postop infection. The wound actually is healed Objective Constitutional Sitting or standing Blood Pressure is within target range for patient.. Pulse regular and within target range for patient.Marland Kitchen Respirations regular, non-labored and within target range.. Temperature is normal and within the target range for the patient.Marland Kitchen appears in no distress. Vitals Time Taken: 2:46 PM,  Height: 71 in, Weight: 263 lbs, BMI: 36.7, Temperature: 97.8 F, Pulse: 53 bpm, Respiratory Rate: 18 breaths/min, Blood  Pressure: 129/74 mmHg. Gastrointestinal (GI) No inguinal or umbilical hernias.. General Notes: Wound exam; right hip the area is totally healed. There is no drainage, no tenderness. Integumentary (Hair, Skin) Wound #1 status is Healed - Epithelialized. Original cause of wound was Surgical Injury. The date acquired was: 05/22/2022. The wound has been in treatment 5 weeks. The wound is located on the Right Upper Leg. The wound measures 0cm length x 0cm width x 0cm depth; 0cm^2 area and 0cm^3 volume. There is fascia exposed. There is a none present amount of drainage noted. There is no granulation within the wound bed. There is no necrotic tissue within the wound bed. 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 The patient can be discharged to a protective order gauze. 2. Other than that no secondary prevention. Jack Blanchard, Jack Blanchard ([id number]) [id number] Page 5 of 5 Electronic Signature(s) Signed: 07/21/2022 4:03:34 PM By: Linton Ham MD Entered By: Linton Ham on 07/21/2022 15:04:54 -------------------------------------------------------------------------------- SuperBill Details Patient Name: Date of Service: [place], [place] 07/21/2022 Medical Record Number: [id number] Patient Account Number: [id number] Date of Birth/Sex: Treating RN: 16-Apr-1951 (72 y.o. Jack Blanchard Primary Care Provider: Royetta Crochet Other Clinician: Massie Kluver Referring Provider: Treating Provider/Extender: RO BSO N, MICHA EL Collie Siad, Nila Nephew in Treatment: 5 Diagnosis Coding ICD-10 Codes Code Description T81.31XA Disruption of external operation (surgical) wound, not elsewhere classified, initial encounter L98.492 Non-pressure chronic ulcer of  skin of other sites with fat layer exposed Z96.641 Presence of right artificial hip joint I10 Essential (primary) hypertension Facility Procedures : CPT4 Code: [id number] Description: [id number] - WOUND CARE VISIT-LEV 2 EST PT Modifier: Quantity: 1 Physician Procedures : CPT4 Code Description Modifier [id number] - WC PHYS LEVEL 2 - EST PT ICD-10 Diagnosis Description T81.31XA Disruption of external operation (surgical) wound, not elsewhere classified, initial encounter L98.492 Non-pressure chronic ulcer of skin of  other sites with fat layer exposed Quantity: 1 Electronic Signature(s) Signed: 07/21/2022 4:03:34 PM By: Linton Ham MD Signed: 07/22/2022 4:49:21 PM By: Massie Kluver Entered By: Massie Kluver on 07/21/2022 15:06:22

## 2022-10-19 ENCOUNTER — Other Ambulatory Visit: Payer: Self-pay | Admitting: Family Medicine

## 2022-10-19 DIAGNOSIS — Z87891 Personal history of nicotine dependence: Secondary | ICD-10-CM

## 2022-11-23 ENCOUNTER — Ambulatory Visit: Payer: Medicare HMO | Attending: Cardiology | Admitting: Cardiology

## 2022-11-23 ENCOUNTER — Encounter: Payer: Self-pay | Admitting: Cardiology

## 2022-11-23 VITALS — BP 128/78 | HR 47 | Ht 71.0 in | Wt 277.8 lb

## 2022-11-23 DIAGNOSIS — E7800 Pure hypercholesterolemia, unspecified: Secondary | ICD-10-CM

## 2022-11-23 DIAGNOSIS — R079 Chest pain, unspecified: Secondary | ICD-10-CM

## 2022-11-23 DIAGNOSIS — R001 Bradycardia, unspecified: Secondary | ICD-10-CM | POA: Diagnosis not present

## 2022-11-23 DIAGNOSIS — I10 Essential (primary) hypertension: Secondary | ICD-10-CM | POA: Diagnosis not present

## 2022-11-23 DIAGNOSIS — R072 Precordial pain: Secondary | ICD-10-CM

## 2022-11-23 DIAGNOSIS — F17200 Nicotine dependence, unspecified, uncomplicated: Secondary | ICD-10-CM | POA: Diagnosis not present

## 2022-11-23 DIAGNOSIS — IMO0001 Reserved for inherently not codable concepts without codable children: Secondary | ICD-10-CM

## 2022-11-23 MED ORDER — ATORVASTATIN CALCIUM 20 MG PO TABS: 20.0000 mg | ORAL_TABLET | Freq: Every day | ORAL | 3 refills | Status: AC
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
## 2022-11-23 NOTE — Patient Instructions (Signed)
Medication Instructions:   STOP Metoprolol START Atorvastatin - Take one tablet ( 20mg ) by mouth daily.   *If you need a refill on your cardiac medications before your next appointment, please call your pharmacy*   Lab Work:  Your physician recommends that you return for lab work in: 2 weeks at the medical mall. You will need to be fasting.  No appt is needed. Hours are M-F 7AM- 6 PM.   If you have labs (blood work) drawn today and your tests are completely normal, you will receive your results only by: MyChart Message (if you have MyChart) OR A paper copy in the mail If you have any lab test that is abnormal or we need to change your treatment, we will call you to review the results.   Testing/Procedures:  Your physician has requested that you have an echocardiogram. Echocardiography is a painless test that uses sound waves to create images of your heart. It provides your doctor with information about the size and shape of your heart and how well your heart's chambers and valves are working. This procedure takes approximately one hour. There are no restrictions for this procedure. Please do NOT wear cologne, perfume, aftershave, or lotions (deodorant is allowed). Please arrive 15 minutes prior to your appointment time.    Your cardiac CT will be scheduled at one of the below locations:   [facility] [address] [place], [place] [address] [phone]  OR   [facility] [address] [place], [place] [address] [phone]   If scheduled at [facility] or [facility], please arrive 15 mins early for check-in and test prep.   Please follow these instructions carefully (unless otherwise directed):  An IV will be required for this test and Nitroglycerin will be given.  Hold all erectile dysfunction medications at least 3 days (72 hrs) prior to test. (Ie  viagra, cialis, sildenafil, tadalafil, etc)   On the Night Before the Test: Be sure to Drink plenty of water. Do not consume any caffeinated/decaffeinated beverages or chocolate 12 hours prior to your test. Do not take any antihistamines 12 hours prior to your test.  On the Day of the Test: Drink plenty of water until 1 hour prior to the test. Do not eat any food 1 hour prior to test. You may take your regular medications prior to the test.  Take metoprolol (Lopressor) 25mg   two hours prior to test. If you take Furosemide/Hydrochlorothiazide/Spironolactone, please HOLD on the morning of the test.    After the Test: Drink plenty of water. After receiving IV contrast, you may experience a mild flushed feeling. This is normal. On occasion, you may experience a mild rash up to 24 hours after the test. This is not dangerous. If this occurs, you can take Benadryl 25 mg and increase your fluid intake. If you experience trouble breathing, this can be serious. If it is severe call 911 IMMEDIATELY. If it is mild, please call our office. If you take any of these medications: Glipizide/Metformin, Avandament, Glucavance, please do not take 48 hours after completing test unless otherwise instructed.  We will call to schedule your test 2-4 weeks out understanding that some insurance companies will need an authorization prior to the service being performed.   For more information and frequently asked questions, please visit our website : [email or web address]  For non-scheduling related questions, please contact the cardiac imaging nurse navigator should you have any  questions/concerns: Rockwell Alexandria, Cardiac Imaging Nurse Navigator Larey Brick, Cardiac Imaging Nurse Navigator [place] [facility] Direct Office Dial: [phone]   For scheduling needs, including cancellations and rescheduling, please call [place], [phone].    Follow-Up: At [facility], you and your health needs are our priority.  As part of our continuing mission to provide you with exceptional heart care, we have created designated Provider Care Teams.  These Care Teams include your primary Cardiologist (physician) and Advanced Practice Providers (APPs -  Physician Assistants and Nurse Practitioners) who all work together to provide you with the care you need, when you need it.  We recommend signing up for the patient portal called "MyChart".  Sign up information is provided on this After Visit Summary.  MyChart is used to connect with patients for Virtual Visits (Telemedicine).  Patients are able to view lab/test results, encounter notes, upcoming appointments, etc.  Non-urgent messages can be sent to your provider as well.   To learn more about what you can do with MyChart, go to [email or web address].    Your next appointment:    After testing  Provider:   You may see Debbe Odea, MD or one of the following Advanced Practice Providers on your designated Care Team:   Nicolasa Ducking, NP Eula Listen, PA-C Cadence Fransico Michael, PA-C Charlsie Quest, NP

## 2022-11-30 ENCOUNTER — Institutional Professional Consult (permissible substitution): Payer: 59 | Admitting: Student in an Organized Health Care Education/Training Program

## 2022-12-04 ENCOUNTER — Encounter: Payer: Self-pay | Admitting: Student in an Organized Health Care Education/Training Program

## 2022-12-16 ENCOUNTER — Ambulatory Visit: Payer: Medicare HMO | Attending: Cardiology

## 2022-12-23 ENCOUNTER — Telehealth: Payer: Self-pay | Admitting: Cardiology

## 2022-12-23 NOTE — Telephone Encounter (Signed)
-----   Message from [facility] S sent at 12/22/2022  2:40 PM EDT ----- Regarding: r/s echo Pt NS echo on 12/16/22.  Could you try to get r/s prior to f/u on 8/16?  If not, please get r/s for next available and r/s f/u after echo.    Thanks Ethel Rana

## 2022-12-23 NOTE — Telephone Encounter (Signed)
Called patient to schedule - VM full  

## 2023-01-01 ENCOUNTER — Ambulatory Visit: Payer: Medicare HMO | Admitting: Cardiology

## 2023-01-13 ENCOUNTER — Telehealth (HOSPITAL_COMMUNITY): Payer: Self-pay | Admitting: *Deleted

## 2023-01-13 NOTE — Telephone Encounter (Signed)
Attempted to call patient regarding upcoming cardiac CT appointment. Voicemail box full. Unable to leave a message.  Gordy Clement RN Navigator Cardiac Imaging [facility] [phone] Office [phone] Cell

## 2023-01-14 ENCOUNTER — Ambulatory Visit
Admission: RE | Admit: 2023-01-14 | Discharge: 2023-01-14 | Disposition: A | Discharge status: Home / Self Care | Payer: Medicare HMO | Source: Ambulatory Visit | Attending: Cardiology | Admitting: Cardiology

## 2023-01-14 ENCOUNTER — Ambulatory Visit: Payer: Medicare HMO | Attending: Cardiology

## 2023-01-14 DIAGNOSIS — I2510 Atherosclerotic heart disease of native coronary artery without angina pectoris: Secondary | ICD-10-CM | POA: Diagnosis not present

## 2023-01-14 DIAGNOSIS — R079 Chest pain, unspecified: Secondary | ICD-10-CM | POA: Diagnosis not present

## 2023-01-14 MED ORDER — IOHEXOL 350 MG/ML SOLN
100.0000 mL | Freq: Once | INTRAVENOUS | Status: AC | PRN
  Administered 2023-01-14: 100 mL via INTRAVENOUS

## 2023-01-14 MED ORDER — SODIUM CHLORIDE 0.9 % IV SOLN: INTRAVENOUS | Status: DC

## 2023-01-14 MED ORDER — NITROGLYCERIN 0.4 MG SL SUBL
0.8000 mg | SUBLINGUAL_TABLET | Freq: Once | SUBLINGUAL | Status: AC
  Administered 2023-01-14: 0.8 mg via SUBLINGUAL

## 2023-01-14 NOTE — Progress Notes (Signed)
Patient tolerated CT well. Drank water after. Vital signs stable encourage to drink water throughout day.Reasons explained and verbalized understanding. Ambulated steady gait.  

## 2023-01-25 ENCOUNTER — Ambulatory Visit: Payer: Medicare HMO | Attending: Cardiology

## 2023-02-09 ENCOUNTER — Ambulatory Visit: Payer: Medicare HMO | Attending: Cardiology

## 2023-02-09 DIAGNOSIS — R072 Precordial pain: Secondary | ICD-10-CM

## 2023-02-09 LAB — ECHOCARDIOGRAM COMPLETE
Area-P 1/2: 3.12 cm2
S' Lateral: 3.7 cm

## 2023-02-15 ENCOUNTER — Encounter: Payer: Self-pay | Admitting: Cardiology

## 2023-02-15 ENCOUNTER — Ambulatory Visit: Payer: Medicare HMO | Attending: Cardiology | Admitting: Cardiology

## 2023-02-15 NOTE — Progress Notes (Deleted)
Cardiology Office Note:  .   Date:  02/15/2023  ID:  Jack Blanchard, DOB 1951/01/23, MRN [id number] PCP: Preston Fleeting, MD  [facility] Providers Cardiologist:  Debbe Odea, MD { Click to update primary MD,subspecialty MD or APP then REFRESH:1}   History of Present Illness: .   Jack Blanchard is a 72 y.o. male with past medical history of hypertension, hyperlipidemia, fibromyalgia, bradycardia, smoker, previous polysubstance abuse with cocaine and marijuana, who is here today for follow-up.  Previously followed by [facility] cardiology.  Echocardiogram done in 04/2019 revealed an LVEF of 60 to 65%.  Nuclear stress testing in 02/2021 with no ischemia was considered low risk study.  Echocardiogram 02/2021 revealed LVEF 55%.  He was last seen in clinic 11/23/2022 by Dr.Agbor-Etang.  At that time he was having chest pressure and heaviness was worsened with exertion.  Symptoms of chest comfort have been ongoing for 1 to 2 years.  He was scheduled for an echocardiogram and a coronary CTA.  He returns to clinic today  ROS: 10 point review of systems has been reviewed and considered negative with exception of what is been listed in the HPI  Studies Reviewed: Marland Kitchen       TTE 02/09/23 1. Left ventricular ejection fraction, by estimation, is 60 to 65%. The  left ventricle has normal function. The left ventricle has no regional  wall motion abnormalities. Left ventricular diastolic parameters are  consistent with Grade I diastolic  dysfunction (impaired relaxation). The average left ventricular global  longitudinal strain is -20.3 %.   2. Right ventricular systolic function is normal. The right ventricular  size is normal. Tricuspid regurgitation signal is inadequate for assessing  PA pressure.   3. Left atrial size was moderately dilated.   4. The mitral valve is normal in structure. Mild mitral valve  regurgitation. No evidence of mitral stenosis.   5. The aortic valve is tricuspid. There is  mild calcification of the  aortic valve. Aortic valve regurgitation is not visualized. Aortic valve  sclerosis is present, with no evidence of aortic valve stenosis.   6. There is borderline dilatation of the aortic root, measuring 39 mm.   7. The inferior vena cava is normal in size with greater than 50%  respiratory variability, suggesting right atrial pressure of 3 mmHg.   Coronary CTA 01/14/23 IMPRESSION: 1. Coronary calcium score of 142. This was 47th percentile for age and sex matched control.   2. Normal coronary origin with right dominance.   3. LAD has calcified plaque proximally causing mild stenosis (25%).   4. Minimal RCA stenosis (<25%)   5. CAD-RADS 2. Mild non-obstructive CAD (25-49%). Consider non-atherosclerotic causes of chest pain. Consider preventive therapy and risk factor modification.   6. Image quality degraded by motion artifacts. Risk Assessment/Calculations:     No BP recorded.  {Refresh Note OR Click here to enter BP  :1}***       Physical Exam:   VS:  There were no vitals taken for this visit.   Wt Readings from Last 3 Encounters:  11/23/22 277 lb 12.8 oz (126 kg)  06/06/22 262 lb (118.8 kg)  05/26/22 264 lb 8.8 oz (120 kg)    GEN: Well nourished, well developed in no acute distress NECK: No JVD; No carotid bruits CARDIAC: ***RRR, no murmurs, rubs, gallops RESPIRATORY:  Clear to auscultation without rales, wheezing or rhonchi  ABDOMEN: Soft, non-tender, non-distended EXTREMITIES:  No edema; No deformity   ASSESSMENT AND PLAN: .   ***    {  Are you ordering a CV Procedure (e.g. stress test, cath, DCCV, TEE, etc)?   Press F2        :[id number]}  Dispo: ***  Signed, Keonda Dow, NP

## 2023-03-08 ENCOUNTER — Ambulatory Visit
Admission: RE | Admit: 2023-03-08 | Discharge: 2023-03-08 | Disposition: A | Discharge status: Home / Self Care | Payer: Medicare HMO | Source: Ambulatory Visit | Attending: Family Medicine | Admitting: Family Medicine

## 2023-03-08 ENCOUNTER — Other Ambulatory Visit: Payer: Self-pay | Admitting: Family Medicine

## 2023-03-08 DIAGNOSIS — Z9181 History of falling: Secondary | ICD-10-CM | POA: Insufficient documentation

## 2023-04-26 ENCOUNTER — Ambulatory Visit (INDEPENDENT_AMBULATORY_CARE_PROVIDER_SITE_OTHER): Payer: Medicare HMO | Admitting: Podiatry

## 2023-04-26 DIAGNOSIS — Z91199 Patient's noncompliance with other medical treatment and regimen due to unspecified reason: Secondary | ICD-10-CM

## 2023-04-26 NOTE — Progress Notes (Signed)
1. No-show for appointment     

## 2023-09-29 ENCOUNTER — Other Ambulatory Visit: Payer: Self-pay | Admitting: Family Medicine

## 2023-09-29 DIAGNOSIS — Z0000 Encounter for general adult medical examination without abnormal findings: Secondary | ICD-10-CM

## 2023-11-25 ENCOUNTER — Ambulatory Visit: Admitting: Urology

## 2023-12-21 ENCOUNTER — Ambulatory Visit: Admitting: Urology

## 2023-12-23 ENCOUNTER — Encounter: Payer: Self-pay | Admitting: Urology

## 2024-01-23 ENCOUNTER — Emergency Department
Admission: EM | Admit: 2024-01-23 | Discharge: 2024-01-24 | Disposition: A | Discharge status: Home / Self Care | Attending: Emergency Medicine | Admitting: Emergency Medicine

## 2024-01-23 ENCOUNTER — Other Ambulatory Visit: Payer: Self-pay

## 2024-01-23 DIAGNOSIS — I10 Essential (primary) hypertension: Secondary | ICD-10-CM | POA: Diagnosis not present

## 2024-01-23 DIAGNOSIS — R1084 Generalized abdominal pain: Secondary | ICD-10-CM | POA: Diagnosis not present

## 2024-01-23 DIAGNOSIS — M25552 Pain in left hip: Secondary | ICD-10-CM | POA: Diagnosis present

## 2024-01-23 DIAGNOSIS — Z96641 Presence of right artificial hip joint: Secondary | ICD-10-CM | POA: Diagnosis not present

## 2024-01-23 LAB — CBC
HCT: 44.1 % (ref 39.0–52.0)
Hemoglobin: 14.7 g/dL (ref 13.0–17.0)
MCH: 31.8 pg (ref 26.0–34.0)
MCHC: 33.3 g/dL (ref 30.0–36.0)
MCV: 95.5 fL (ref 80.0–100.0)
Platelets: 217 K/uL (ref 150–400)
RBC: 4.62 MIL/uL (ref 4.22–5.81)
RDW: 15.5 % (ref 11.5–15.5)
WBC: 8.6 K/uL (ref 4.0–10.5)
nRBC: 0 % (ref 0.0–0.2)

## 2024-01-23 MED ORDER — SODIUM CHLORIDE 0.9 % IV BOLUS
1000.0000 mL | Freq: Once | INTRAVENOUS | Status: AC
  Administered 2024-01-23: 1000 mL via INTRAVENOUS

## 2024-01-23 MED ORDER — ONDANSETRON HCL 4 MG/2ML IJ SOLN
4.0000 mg | Freq: Once | INTRAMUSCULAR | Status: AC
  Administered 2024-01-23: 4 mg via INTRAVENOUS
  Filled 2024-01-23: qty 2

## 2024-01-23 MED ORDER — MORPHINE SULFATE (PF) 4 MG/ML IV SOLN
4.0000 mg | Freq: Once | INTRAVENOUS | Status: AC
  Administered 2024-01-23: 4 mg via INTRAVENOUS
  Filled 2024-01-23: qty 1
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
## 2024-01-24 ENCOUNTER — Emergency Department

## 2024-01-24 LAB — URINALYSIS, COMPLETE (UACMP) WITH MICROSCOPIC
Bacteria, UA: NONE SEEN
Bilirubin Urine: NEGATIVE
Glucose, UA: NEGATIVE mg/dL
Hgb urine dipstick: NEGATIVE
Ketones, ur: 5 mg/dL — AB
Leukocytes,Ua: NEGATIVE
Nitrite: NEGATIVE
Protein, ur: NEGATIVE mg/dL
Specific Gravity, Urine: 1.017 (ref 1.005–1.030)
pH: 7 (ref 5.0–8.0)

## 2024-01-24 LAB — COMPREHENSIVE METABOLIC PANEL WITH GFR
ALT: 13 U/L (ref 0–44)
AST: 26 U/L (ref 15–41)
Albumin: 4.2 g/dL (ref 3.5–5.0)
Alkaline Phosphatase: 79 U/L (ref 38–126)
Anion gap: 12 (ref 5–15)
BUN: 18 mg/dL (ref 8–23)
CO2: 21 mmol/L — ABNORMAL LOW (ref 22–32)
Calcium: 9.2 mg/dL (ref 8.9–10.3)
Chloride: 108 mmol/L (ref 98–111)
Creatinine, Ser: 0.98 mg/dL (ref 0.61–1.24)
GFR, Estimated: >60 mL/min (ref >60)
Glucose, Bld: 88 mg/dL (ref 70–99)
Potassium: 3.6 mmol/L (ref 3.5–5.1)
Sodium: 141 mmol/L (ref 135–145)
Total Bilirubin: 1.1 mg/dL (ref 0.0–1.2)
Total Protein: 7.6 g/dL (ref 6.5–8.1)

## 2024-01-24 LAB — LIPASE, BLOOD: Lipase: 32 U/L (ref 11–51)

## 2024-01-24 LAB — TROPONIN I (HIGH SENSITIVITY): Troponin I (High Sensitivity): 9 ng/L (ref <18)

## 2024-01-24 MED ORDER — IOHEXOL 300 MG/ML  SOLN
100.0000 mL | Freq: Once | INTRAMUSCULAR | Status: AC | PRN
  Administered 2024-01-24: 100 mL via INTRAVENOUS

## 2024-01-24 MED ORDER — MORPHINE SULFATE (PF) 4 MG/ML IV SOLN
4.0000 mg | Freq: Once | INTRAVENOUS | Status: AC
  Administered 2024-01-24: 4 mg via INTRAVENOUS
  Filled 2024-01-24: qty 1

## 2024-01-24 MED ORDER — OXYCODONE HCL 5 MG PO TABS
5.0000 mg | ORAL_TABLET | Freq: Three times a day (TID) | ORAL | 0 refills | Status: AC | PRN
Start: 2024-01-24 — End: 2025-01-23
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
## 2024-01-24 NOTE — Discharge Instructions (Addendum)
 Please take your pain medication as needed but only as prescribed.  Do not drink alcohol or drive while taking pain medication.  Please follow-up with your doctor regarding today's ER visit.  Return to the emergency department should your pain worsen, if you have any return of any pain in your abdomen, or any other symptom personally concerning to yourself. Also as we discussed your CT scan did show a 7 mm nodule in the left lung.  Please follow-up with your doctor for repeat CT scan in 6 to 12 months to ensure no enlargement.
